# Patient Record
Sex: Male | Born: 1990 | Race: White | Hispanic: No | Marital: Single | Smoking: Former smoker
Health system: Southern US, Community
[De-identification: ages and names within clinical notes are randomized; demographics above are authoritative.]

## PROBLEM LIST (undated history)

## (undated) DIAGNOSIS — R0602 Shortness of breath: Secondary | ICD-10-CM

## (undated) DIAGNOSIS — I319 Disease of pericardium, unspecified: Secondary | ICD-10-CM

## (undated) DIAGNOSIS — C801 Malignant (primary) neoplasm, unspecified: Secondary | ICD-10-CM

## (undated) DIAGNOSIS — F988 Other specified behavioral and emotional disorders with onset usually occurring in childhood and adolescence: Secondary | ICD-10-CM

## (undated) DIAGNOSIS — J45909 Unspecified asthma, uncomplicated: Secondary | ICD-10-CM

## (undated) HISTORY — PX: WISDOM TOOTH EXTRACTION: SHX21

## (undated) HISTORY — PX: APPENDECTOMY: SHX54

---
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
## 2016-04-19 ENCOUNTER — Ambulatory Visit (INDEPENDENT_AMBULATORY_CARE_PROVIDER_SITE_OTHER): Payer: BLUE CROSS/BLUE SHIELD

## 2016-04-19 ENCOUNTER — Ambulatory Visit (HOSPITAL_COMMUNITY): Payer: BLUE CROSS/BLUE SHIELD

## 2016-04-19 ENCOUNTER — Encounter (HOSPITAL_COMMUNITY): Payer: Self-pay | Admitting: *Deleted

## 2016-04-19 ENCOUNTER — Ambulatory Visit (HOSPITAL_COMMUNITY)
Admission: EM | Admit: 2016-04-19 | Discharge: 2016-04-19 | Disposition: A | Discharge status: Home / Self Care | Payer: BLUE CROSS/BLUE SHIELD | Attending: Family Medicine | Admitting: Family Medicine

## 2016-04-19 DIAGNOSIS — M778 Other enthesopathies, not elsewhere classified: Secondary | ICD-10-CM

## 2016-04-19 DIAGNOSIS — M25531 Pain in right wrist: Secondary | ICD-10-CM | POA: Diagnosis not present

## 2016-04-19 MED ORDER — DICLOFENAC POTASSIUM 50 MG PO TABS: 50.0000 mg | ORAL_TABLET | Freq: Three times a day (TID) | ORAL | 0 refills | Status: DC

## 2016-04-19 NOTE — ED Provider Notes (Signed)
[place]    CSN: [id number] Arrival date & time: 04/19/16  1645     History   Chief Complaint Chief Complaint  Patient presents with  . Wrist Pain    HPI Martin Spencer is a 25 y.o. male.   The history is provided by the patient.  Wrist Pain  This is a new problem. The current episode started 2 days ago. The problem has been gradually worsening. Pertinent negatives include no chest pain, no abdominal pain, no headaches and no shortness of breath. Associated symptoms comments: repetetive use  Injury at home and work, now with pain.. The symptoms are aggravated by bending.    History reviewed. No pertinent past medical history.  There are no active problems to display for this patient.   History reviewed. No pertinent surgical history.     Home Medications    Prior to Admission medications   Not on File    Family History History reviewed. No pertinent family history.  Social History Social History  Substance Use Topics  . Smoking status: Never Smoker  . Smokeless tobacco: Never Used  . Alcohol use Yes     Allergies   Patient has no allergy information on record.   Review of Systems Review of Systems  Constitutional: Negative.   Respiratory: Negative for shortness of breath.   Cardiovascular: Negative for chest pain.  Gastrointestinal: Negative for abdominal pain.  Genitourinary: Negative.   Musculoskeletal: Positive for myalgias.  Skin: Negative.   Neurological: Negative.  Negative for headaches.  All other systems reviewed and are negative.    Physical Exam Triage Vital Signs ED Triage Vitals  Enc Vitals Group     BP 04/19/16 1706 134/72     Pulse Rate 04/19/16 1706 78     Resp 04/19/16 1706 18     Temp 04/19/16 1706 98.6 F (37 C)     Temp Source 04/19/16 1706 Oral     SpO2 04/19/16 1706 100 %     Weight --      Height --      Head Circumference --      Peak Flow --      Pain Score 04/19/16 1707 4     Pain Loc --       Pain Edu? --      Excl. in [place]? --    No data found.   Updated Vital Signs BP 134/72 (BP Location: Right Arm)   Pulse 78   Temp 98.6 F (37 C) (Oral)   Resp 18   SpO2 100%   Visual Acuity Right Eye Distance:   Left Eye Distance:   Bilateral Distance:    Right Eye Near:   Left Eye Near:    Bilateral Near:     Physical Exam  Constitutional: He appears well-nourished. He appears distressed.  Musculoskeletal: He exhibits tenderness.  Neurological: He is alert.  Skin: Skin is warm and dry.  Nursing note and vitals reviewed.    UC Treatments / Results  Labs (all labs ordered are listed, but only abnormal results are displayed) Labs Reviewed - No data to display  EKG  EKG Interpretation None       Radiology No results found.  Procedures Procedures (including critical care time)  Medications Ordered in UC Medications - No data to display   Initial Impression / Assessment and Plan / UC Course  I have reviewed the triage vital signs and the nursing notes.  Pertinent labs & imaging results  that were available during my care of the patient were reviewed by me and considered in my medical decision making (see chart for details).  Clinical Course       Final Clinical Impressions(s) / UC Diagnoses   Final diagnoses:  None    New Prescriptions New Prescriptions   No medications on file     Billy Fischer, MD 05/02/16 [id number]

## 2016-04-19 NOTE — ED Notes (Signed)
r  Universal   splint

## 2016-04-19 NOTE — Discharge Instructions (Signed)
Splint, ice , medicine and see specialist if further problems

## 2016-04-19 NOTE — ED Triage Notes (Signed)
Pt  Reports  Pain  r   Wrist  X   Several  Days  Ago   He  Denys  Any  specefic       Injury       He  Reports         That   He   Has  Been      Working   Out a  Lot  Recently      He   Is  Awake  And  Alert  And  Oriented

## 2016-05-08 DIAGNOSIS — Z114 Encounter for screening for human immunodeficiency virus [HIV]: Secondary | ICD-10-CM | POA: Diagnosis not present

## 2016-05-08 DIAGNOSIS — Z7251 High risk heterosexual behavior: Secondary | ICD-10-CM | POA: Diagnosis not present

## 2016-10-29 DIAGNOSIS — Z79899 Other long term (current) drug therapy: Secondary | ICD-10-CM | POA: Diagnosis not present

## 2016-10-30 DIAGNOSIS — F900 Attention-deficit hyperactivity disorder, predominantly inattentive type: Secondary | ICD-10-CM | POA: Diagnosis not present

## 2016-10-30 DIAGNOSIS — F411 Generalized anxiety disorder: Secondary | ICD-10-CM | POA: Diagnosis not present

## 2016-11-27 DIAGNOSIS — F900 Attention-deficit hyperactivity disorder, predominantly inattentive type: Secondary | ICD-10-CM | POA: Diagnosis not present

## 2016-12-25 DIAGNOSIS — F900 Attention-deficit hyperactivity disorder, predominantly inattentive type: Secondary | ICD-10-CM | POA: Diagnosis not present

## 2017-03-26 DIAGNOSIS — F411 Generalized anxiety disorder: Secondary | ICD-10-CM | POA: Diagnosis not present

## 2017-03-26 DIAGNOSIS — F900 Attention-deficit hyperactivity disorder, predominantly inattentive type: Secondary | ICD-10-CM | POA: Diagnosis not present

## 2017-06-24 DIAGNOSIS — F900 Attention-deficit hyperactivity disorder, predominantly inattentive type: Secondary | ICD-10-CM | POA: Diagnosis not present

## 2017-10-13 DIAGNOSIS — F900 Attention-deficit hyperactivity disorder, predominantly inattentive type: Secondary | ICD-10-CM | POA: Diagnosis not present

## 2017-10-13 DIAGNOSIS — F411 Generalized anxiety disorder: Secondary | ICD-10-CM | POA: Diagnosis not present

## 2017-10-26 DIAGNOSIS — S81811A Laceration without foreign body, right lower leg, initial encounter: Secondary | ICD-10-CM | POA: Diagnosis not present

## 2017-11-14 ENCOUNTER — Emergency Department (HOSPITAL_COMMUNITY): Payer: BLUE CROSS/BLUE SHIELD | Admitting: Certified Registered Nurse Anesthetist

## 2017-11-14 ENCOUNTER — Encounter (HOSPITAL_COMMUNITY): Payer: Self-pay | Admitting: Emergency Medicine

## 2017-11-14 ENCOUNTER — Encounter (HOSPITAL_COMMUNITY)
Admission: EM | Disposition: A | Discharge status: Home / Self Care | Payer: Self-pay | Source: Home / Self Care | Attending: Emergency Medicine

## 2017-11-14 ENCOUNTER — Other Ambulatory Visit: Payer: Self-pay

## 2017-11-14 ENCOUNTER — Emergency Department (HOSPITAL_COMMUNITY): Payer: BLUE CROSS/BLUE SHIELD

## 2017-11-14 ENCOUNTER — Observation Stay (HOSPITAL_COMMUNITY)
Admission: EM | Admit: 2017-11-14 | Discharge: 2017-11-15 | Disposition: A | Discharge status: Home / Self Care | Payer: BLUE CROSS/BLUE SHIELD | Attending: General Surgery | Admitting: General Surgery

## 2017-11-14 DIAGNOSIS — Z79899 Other long term (current) drug therapy: Secondary | ICD-10-CM | POA: Diagnosis not present

## 2017-11-14 DIAGNOSIS — K37 Unspecified appendicitis: Secondary | ICD-10-CM | POA: Diagnosis not present

## 2017-11-14 DIAGNOSIS — R112 Nausea with vomiting, unspecified: Secondary | ICD-10-CM | POA: Diagnosis not present

## 2017-11-14 DIAGNOSIS — K3580 Unspecified acute appendicitis: Principal | ICD-10-CM | POA: Diagnosis present

## 2017-11-14 DIAGNOSIS — R109 Unspecified abdominal pain: Secondary | ICD-10-CM | POA: Diagnosis not present

## 2017-11-14 DIAGNOSIS — R1110 Vomiting, unspecified: Secondary | ICD-10-CM | POA: Diagnosis not present

## 2017-11-14 HISTORY — PX: LAPAROSCOPIC APPENDECTOMY: SHX408

## 2017-11-14 LAB — URINALYSIS, ROUTINE W REFLEX MICROSCOPIC
Bilirubin Urine: NEGATIVE
GLUCOSE, UA: NEGATIVE mg/dL
HGB URINE DIPSTICK: NEGATIVE
Ketones, ur: NEGATIVE mg/dL
Leukocytes, UA: NEGATIVE
Nitrite: NEGATIVE
PH: 7 (ref 5.0–8.0)
Protein, ur: NEGATIVE mg/dL
SPECIFIC GRAVITY, URINE: 1.02 (ref 1.005–1.030)

## 2017-11-14 LAB — COMPREHENSIVE METABOLIC PANEL
ALBUMIN: 4.4 g/dL (ref 3.5–5.0)
ALT: 59 U/L — ABNORMAL HIGH (ref 0–44)
ANION GAP: 7 (ref 5–15)
AST: 41 U/L (ref 15–41)
Alkaline Phosphatase: 48 U/L (ref 38–126)
BUN: 11 mg/dL (ref 6–20)
CHLORIDE: 104 mmol/L (ref 98–111)
CO2: 28 mmol/L (ref 22–32)
Calcium: 10 mg/dL (ref 8.9–10.3)
Creatinine, Ser: 0.9 mg/dL (ref 0.61–1.24)
GFR calc non Af Amer: >60 mL/min (ref >60)
GLUCOSE: 113 mg/dL — AB (ref 70–99)
Potassium: 4.1 mmol/L (ref 3.5–5.1)
SODIUM: 139 mmol/L (ref 135–145)
Total Bilirubin: 2.5 mg/dL — ABNORMAL HIGH (ref 0.3–1.2)
Total Protein: 7.5 g/dL (ref 6.5–8.1)

## 2017-11-14 LAB — CBC
HEMATOCRIT: 46.7 % (ref 39.0–52.0)
HEMOGLOBIN: 16.3 g/dL (ref 13.0–17.0)
MCH: 29.9 pg (ref 26.0–34.0)
MCHC: 34.9 g/dL (ref 30.0–36.0)
MCV: 85.5 fL (ref 78.0–100.0)
Platelets: 200 10*3/uL (ref 150–400)
RBC: 5.46 MIL/uL (ref 4.22–5.81)
RDW: 12.1 % (ref 11.5–15.5)
WBC: 16.2 10*3/uL — ABNORMAL HIGH (ref 4.0–10.5)

## 2017-11-14 LAB — LIPASE, BLOOD: LIPASE: 31 U/L (ref 11–51)

## 2017-11-14 SURGERY — APPENDECTOMY, LAPAROSCOPIC
Anesthesia: General | Site: Abdomen

## 2017-11-14 MED ORDER — PHENYLEPHRINE 40 MCG/ML (10ML) SYRINGE FOR IV PUSH (FOR BLOOD PRESSURE SUPPORT)
PREFILLED_SYRINGE | INTRAVENOUS | Status: DC | PRN
  Administered 2017-11-14: 80 ug via INTRAVENOUS

## 2017-11-14 MED ORDER — ONDANSETRON HCL 4 MG/2ML IJ SOLN
4.0000 mg | Freq: Once | INTRAMUSCULAR | Status: AC
  Administered 2017-11-14: 4 mg via INTRAVENOUS
  Filled 2017-11-14 (×2): qty 2

## 2017-11-14 MED ORDER — SUGAMMADEX SODIUM 200 MG/2ML IV SOLN
INTRAVENOUS | Status: DC | PRN
  Administered 2017-11-14: 200 mg via INTRAVENOUS

## 2017-11-14 MED ORDER — ENOXAPARIN SODIUM 40 MG/0.4ML ~~LOC~~ SOLN
40.0000 mg | SUBCUTANEOUS | Status: DC
  Administered 2017-11-15: 40 mg via SUBCUTANEOUS
  Filled 2017-11-14: qty 0.4

## 2017-11-14 MED ORDER — HYDROMORPHONE HCL 1 MG/ML IJ SOLN
0.2500 mg | INTRAMUSCULAR | Status: DC | PRN
  Administered 2017-11-14 (×2): 0.5 mg via INTRAVENOUS

## 2017-11-14 MED ORDER — 0.9 % SODIUM CHLORIDE (POUR BTL) OPTIME
TOPICAL | Status: DC | PRN
  Administered 2017-11-14: 1000 mL

## 2017-11-14 MED ORDER — BUPIVACAINE-EPINEPHRINE 0.25% -1:200000 IJ SOLN
INTRAMUSCULAR | Status: DC | PRN
  Administered 2017-11-14: 20 mL

## 2017-11-14 MED ORDER — ROCURONIUM BROMIDE 10 MG/ML (PF) SYRINGE
PREFILLED_SYRINGE | INTRAVENOUS | Status: AC
  Filled 2017-11-14: qty 10

## 2017-11-14 MED ORDER — ENSURE SURGERY PO LIQD
237.0000 mL | Freq: Two times a day (BID) | ORAL | Status: DC
  Filled 2017-11-14 (×3): qty 237

## 2017-11-14 MED ORDER — PHENYLEPHRINE 40 MCG/ML (10ML) SYRINGE FOR IV PUSH (FOR BLOOD PRESSURE SUPPORT)
PREFILLED_SYRINGE | INTRAVENOUS | Status: AC
  Filled 2017-11-14: qty 10

## 2017-11-14 MED ORDER — ONDANSETRON HCL 4 MG/2ML IJ SOLN
4.0000 mg | Freq: Four times a day (QID) | INTRAMUSCULAR | Status: DC | PRN
  Administered 2017-11-14: 4 mg via INTRAVENOUS
  Filled 2017-11-14: qty 2

## 2017-11-14 MED ORDER — ONDANSETRON HCL 4 MG/2ML IJ SOLN
INTRAMUSCULAR | Status: DC | PRN
  Administered 2017-11-14: 30 mg via INTRAVENOUS

## 2017-11-14 MED ORDER — DEXAMETHASONE SODIUM PHOSPHATE 10 MG/ML IJ SOLN
INTRAMUSCULAR | Status: DC | PRN
  Administered 2017-11-14: 10 mg via INTRAVENOUS

## 2017-11-14 MED ORDER — ONDANSETRON HCL 4 MG PO TABS: 4.0000 mg | ORAL_TABLET | Freq: Four times a day (QID) | ORAL | Status: DC | PRN

## 2017-11-14 MED ORDER — DIPHENHYDRAMINE HCL 25 MG PO CAPS: 25.0000 mg | ORAL_CAPSULE | Freq: Four times a day (QID) | ORAL | Status: DC | PRN

## 2017-11-14 MED ORDER — ACETAMINOPHEN 500 MG PO TABS
1000.0000 mg | ORAL_TABLET | Freq: Four times a day (QID) | ORAL | Status: AC
  Administered 2017-11-14 – 2017-11-15 (×4): 1000 mg via ORAL
  Filled 2017-11-14 (×4): qty 2

## 2017-11-14 MED ORDER — OXYCODONE HCL 5 MG/5ML PO SOLN: 5.0000 mg | Freq: Once | ORAL | Status: DC | PRN

## 2017-11-14 MED ORDER — MIDAZOLAM HCL 2 MG/2ML IJ SOLN
INTRAMUSCULAR | Status: DC | PRN
  Administered 2017-11-14: 2 mg via INTRAVENOUS

## 2017-11-14 MED ORDER — SODIUM CHLORIDE 0.9 % IR SOLN
Status: DC | PRN
  Administered 2017-11-14: 1000 mL

## 2017-11-14 MED ORDER — IOHEXOL 300 MG/ML  SOLN
100.0000 mL | Freq: Once | INTRAMUSCULAR | Status: AC | PRN
  Administered 2017-11-14: 100 mL via INTRAVENOUS

## 2017-11-14 MED ORDER — ONDANSETRON HCL 4 MG/2ML IJ SOLN
INTRAMUSCULAR | Status: AC
  Administered 2017-11-14: 4 mg via INTRAVENOUS
  Filled 2017-11-14: qty 2

## 2017-11-14 MED ORDER — MIDAZOLAM HCL 2 MG/2ML IJ SOLN
INTRAMUSCULAR | Status: AC
  Filled 2017-11-14: qty 2

## 2017-11-14 MED ORDER — DEXAMETHASONE SODIUM PHOSPHATE 10 MG/ML IJ SOLN
INTRAMUSCULAR | Status: AC
  Filled 2017-11-14: qty 1

## 2017-11-14 MED ORDER — SUCCINYLCHOLINE CHLORIDE 200 MG/10ML IV SOSY
PREFILLED_SYRINGE | INTRAVENOUS | Status: AC
  Filled 2017-11-14: qty 10

## 2017-11-14 MED ORDER — OXYCODONE HCL 5 MG PO TABS
5.0000 mg | ORAL_TABLET | ORAL | Status: DC | PRN
  Administered 2017-11-14 – 2017-11-15 (×5): 10 mg via ORAL
  Filled 2017-11-14 (×5): qty 2

## 2017-11-14 MED ORDER — FENTANYL CITRATE (PF) 250 MCG/5ML IJ SOLN
INTRAMUSCULAR | Status: DC | PRN
  Administered 2017-11-14 (×3): 50 ug via INTRAVENOUS

## 2017-11-14 MED ORDER — ZOLPIDEM TARTRATE 5 MG PO TABS: 5.0000 mg | ORAL_TABLET | Freq: Every evening | ORAL | Status: DC | PRN

## 2017-11-14 MED ORDER — ALUM & MAG HYDROXIDE-SIMETH 200-200-20 MG/5ML PO SUSP: 30.0000 mL | Freq: Four times a day (QID) | ORAL | Status: DC | PRN

## 2017-11-14 MED ORDER — MORPHINE SULFATE (PF) 4 MG/ML IV SOLN
8.0000 mg | Freq: Once | INTRAVENOUS | Status: AC
  Administered 2017-11-14: 8 mg via INTRAVENOUS
  Filled 2017-11-14: qty 2

## 2017-11-14 MED ORDER — METRONIDAZOLE IN NACL 5-0.79 MG/ML-% IV SOLN
500.0000 mg | Freq: Once | INTRAVENOUS | Status: AC
  Administered 2017-11-14: 500 mg via INTRAVENOUS
  Filled 2017-11-14: qty 100

## 2017-11-14 MED ORDER — MORPHINE SULFATE (PF) 4 MG/ML IV SOLN
4.0000 mg | INTRAVENOUS | Status: DC | PRN
  Administered 2017-11-14 – 2017-11-15 (×3): 4 mg via INTRAVENOUS
  Filled 2017-11-14 (×4): qty 1

## 2017-11-14 MED ORDER — ONDANSETRON HCL 4 MG/2ML IJ SOLN
INTRAMUSCULAR | Status: AC
  Filled 2017-11-14: qty 2

## 2017-11-14 MED ORDER — PROPOFOL 10 MG/ML IV BOLUS
INTRAVENOUS | Status: AC
  Filled 2017-11-14: qty 20

## 2017-11-14 MED ORDER — CELECOXIB 200 MG PO CAPS
200.0000 mg | ORAL_CAPSULE | Freq: Two times a day (BID) | ORAL | Status: DC
  Administered 2017-11-14 – 2017-11-15 (×3): 200 mg via ORAL
  Filled 2017-11-14 (×3): qty 1

## 2017-11-14 MED ORDER — ONDANSETRON 4 MG PO TBDP
4.0000 mg | ORAL_TABLET | Freq: Once | ORAL | Status: AC | PRN
  Administered 2017-11-14: 4 mg via ORAL
  Filled 2017-11-14: qty 1

## 2017-11-14 MED ORDER — HYDROMORPHONE HCL 1 MG/ML IJ SOLN
INTRAMUSCULAR | Status: AC
  Administered 2017-11-14: 0.5 mg via INTRAVENOUS
  Filled 2017-11-14: qty 1

## 2017-11-14 MED ORDER — OXYCODONE-ACETAMINOPHEN 5-325 MG PO TABS
1.0000 | ORAL_TABLET | ORAL | Status: DC | PRN
  Administered 2017-11-14: 1 via ORAL
  Filled 2017-11-14: qty 1

## 2017-11-14 MED ORDER — GABAPENTIN 300 MG PO CAPS
300.0000 mg | ORAL_CAPSULE | Freq: Two times a day (BID) | ORAL | Status: DC
  Administered 2017-11-14 – 2017-11-15 (×2): 300 mg via ORAL
  Filled 2017-11-14 (×2): qty 1

## 2017-11-14 MED ORDER — SODIUM CHLORIDE 0.9 % IV SOLN
2.0000 g | Freq: Once | INTRAVENOUS | Status: AC
  Administered 2017-11-14: 2 g via INTRAVENOUS
  Filled 2017-11-14: qty 20

## 2017-11-14 MED ORDER — KCL IN DEXTROSE-NACL 20-5-0.45 MEQ/L-%-% IV SOLN
INTRAVENOUS | Status: DC
  Administered 2017-11-14: 17:00:00 via INTRAVENOUS
  Filled 2017-11-14: qty 1000

## 2017-11-14 MED ORDER — DIPHENHYDRAMINE HCL 50 MG/ML IJ SOLN: 25.0000 mg | Freq: Four times a day (QID) | INTRAMUSCULAR | Status: DC | PRN

## 2017-11-14 MED ORDER — HYDRALAZINE HCL 20 MG/ML IJ SOLN: 10.0000 mg | INTRAMUSCULAR | Status: DC | PRN

## 2017-11-14 MED ORDER — SODIUM CHLORIDE 0.9 % IV BOLUS
1000.0000 mL | Freq: Once | INTRAVENOUS | Status: AC
  Administered 2017-11-14: 1000 mL via INTRAVENOUS

## 2017-11-14 MED ORDER — OXYCODONE HCL 5 MG PO TABS: 5.0000 mg | ORAL_TABLET | Freq: Once | ORAL | Status: DC | PRN

## 2017-11-14 MED ORDER — ONDANSETRON HCL 4 MG/2ML IJ SOLN
4.0000 mg | Freq: Four times a day (QID) | INTRAMUSCULAR | Status: AC | PRN
  Administered 2017-11-14: 4 mg via INTRAVENOUS

## 2017-11-14 MED ORDER — KETOROLAC TROMETHAMINE 30 MG/ML IJ SOLN
INTRAMUSCULAR | Status: DC | PRN
  Administered 2017-11-14: 30 mg via INTRAVENOUS

## 2017-11-14 MED ORDER — FENTANYL CITRATE (PF) 250 MCG/5ML IJ SOLN
INTRAMUSCULAR | Status: AC
Start: 2017-11-14 — End: 2017-11-14
  Filled 2017-11-14: qty 5

## 2017-11-14 MED ORDER — LIDOCAINE 2% (20 MG/ML) 5 ML SYRINGE
INTRAMUSCULAR | Status: AC
  Filled 2017-11-14: qty 5

## 2017-11-14 MED ORDER — BUPIVACAINE-EPINEPHRINE (PF) 0.25% -1:200000 IJ SOLN
INTRAMUSCULAR | Status: AC
  Filled 2017-11-14: qty 30

## 2017-11-14 MED ORDER — ROCURONIUM BROMIDE 10 MG/ML (PF) SYRINGE
PREFILLED_SYRINGE | INTRAVENOUS | Status: DC | PRN
  Administered 2017-11-14: 30 mg via INTRAVENOUS
  Administered 2017-11-14: 10 mg via INTRAVENOUS

## 2017-11-14 MED ORDER — LACTATED RINGERS IV SOLN
INTRAVENOUS | Status: DC | PRN
  Administered 2017-11-14: 14:00:00 via INTRAVENOUS

## 2017-11-14 SURGICAL SUPPLY — 46 items
ADH SKN CLS APL DERMABOND .7 (GAUZE/BANDAGES/DRESSINGS) ×1
APPLIER CLIP ROT 10 11.4 M/L (STAPLE)
APR CLP MED LRG 11.4X10 (STAPLE)
BAG SPEC RTRVL 10 TROC 200 (ENDOMECHANICALS) ×1
BLADE CLIPPER SURG (BLADE) ×1 IMPLANT
CANISTER SUCT 3000ML PPV (MISCELLANEOUS) ×2 IMPLANT
CHLORAPREP W/TINT 26ML (MISCELLANEOUS) ×2 IMPLANT
CLIP APPLIE ROT 10 11.4 M/L (STAPLE) IMPLANT
COVER SURGICAL LIGHT HANDLE (MISCELLANEOUS) ×2 IMPLANT
CUTTER FLEX LINEAR 45M (STAPLE) ×2 IMPLANT
DERMABOND ADVANCED (GAUZE/BANDAGES/DRESSINGS) ×1
DERMABOND ADVANCED .7 DNX12 (GAUZE/BANDAGES/DRESSINGS) ×1 IMPLANT
ELECT REM PT RETURN 9FT ADLT (ELECTROSURGICAL) ×2
ELECTRODE REM PT RTRN 9FT ADLT (ELECTROSURGICAL) ×1 IMPLANT
GLOVE BIO SURGEON STRL SZ8 (GLOVE) ×2 IMPLANT
GLOVE BIOGEL PI IND STRL 8 (GLOVE) ×1 IMPLANT
GLOVE BIOGEL PI INDICATOR 8 (GLOVE) ×1
GOWN STRL REUS W/ TWL LRG LVL3 (GOWN DISPOSABLE) ×2 IMPLANT
GOWN STRL REUS W/ TWL XL LVL3 (GOWN DISPOSABLE) ×1 IMPLANT
GOWN STRL REUS W/TWL LRG LVL3 (GOWN DISPOSABLE) ×4
GOWN STRL REUS W/TWL XL LVL3 (GOWN DISPOSABLE) ×2
KIT BASIN OR (CUSTOM PROCEDURE TRAY) ×2 IMPLANT
KIT TURNOVER KIT B (KITS) ×2 IMPLANT
NEEDLE 22X1 1/2 (OR ONLY) (NEEDLE) ×2 IMPLANT
NS IRRIG 1000ML POUR BTL (IV SOLUTION) ×2 IMPLANT
PAD ARMBOARD 7.5X6 YLW CONV (MISCELLANEOUS) ×4 IMPLANT
POUCH RETRIEVAL ECOSAC 10 (ENDOMECHANICALS) ×1 IMPLANT
POUCH RETRIEVAL ECOSAC 10MM (ENDOMECHANICALS) ×1
RELOAD 45 VASCULAR/THIN (ENDOMECHANICALS) IMPLANT
RELOAD STAPLE 45 2.5 WHT GRN (ENDOMECHANICALS) IMPLANT
RELOAD STAPLE 45 3.5 BLU ETS (ENDOMECHANICALS) IMPLANT
RELOAD STAPLE TA45 3.5 REG BLU (ENDOMECHANICALS) ×2 IMPLANT
SCISSORS LAP 5X35 DISP (ENDOMECHANICALS) ×1 IMPLANT
SET IRRIG TUBING LAPAROSCOPIC (IRRIGATION / IRRIGATOR) ×2 IMPLANT
SHEARS HARMONIC ACE PLUS 36CM (ENDOMECHANICALS) ×2 IMPLANT
SPECIMEN JAR SMALL (MISCELLANEOUS) ×2 IMPLANT
SUT VIC AB 4-0 PS2 27 (SUTURE) ×2 IMPLANT
TOWEL OR 17X24 6PK STRL BLUE (TOWEL DISPOSABLE) ×1 IMPLANT
TOWEL OR 17X26 10 PK STRL BLUE (TOWEL DISPOSABLE) ×2 IMPLANT
TRAY FOLEY CATH SILVER 16FR (SET/KITS/TRAYS/PACK) ×1 IMPLANT
TRAY LAPAROSCOPIC MC (CUSTOM PROCEDURE TRAY) ×2 IMPLANT
TROCAR XCEL 12X100 BLDLESS (ENDOMECHANICALS) ×2 IMPLANT
TROCAR XCEL BLUNT TIP 100MML (ENDOMECHANICALS) ×2 IMPLANT
TROCAR XCEL NON-BLD 5MMX100MML (ENDOMECHANICALS) ×2 IMPLANT
TUBING INSUFFLATION (TUBING) ×2 IMPLANT
WATER STERILE IRR 1000ML POUR (IV SOLUTION) ×2 IMPLANT

## 2017-11-14 NOTE — Progress Notes (Signed)
Patient arrived to 6 n07 A&Ox4, VSS, LFA IV intact and infusing.  Noted to have 3 port sites with skin glue closure.  Denies pain at this time.  Family at bedside.  Patient and family oriented to room and equipment.  Will continue to monitor.

## 2017-11-14 NOTE — Anesthesia Procedure Notes (Signed)
Procedure Name: Intubation Date/Time: 11/14/2017 2:00 PM Performed by: Clearnce Sorrel, CRNA Pre-anesthesia Checklist: Patient identified, Emergency Drugs available, Suction available, Patient being monitored and Timeout performed Patient Re-evaluated:Patient Re-evaluated prior to induction Oxygen Delivery Method: Circle system utilized Preoxygenation: Pre-oxygenation with 100% oxygen Induction Type: IV induction Laryngoscope Size: Mac and 4 Grade View: Grade I Tube type: Oral Tube size: 7.5 mm Number of attempts: 1 Airway Equipment and Method: Stylet Placement Confirmation: ETT inserted through vocal cords under direct vision,  positive ETCO2 and breath sounds checked- equal and bilateral Secured at: 23 cm Tube secured with: Tape Dental Injury: Teeth and Oropharynx as per pre-operative assessment

## 2017-11-14 NOTE — Op Note (Signed)
11/14/2017  2:48 PM  PATIENT:  Martin Spencer  27 y.o. male  PRE-OPERATIVE DIAGNOSIS:  appendicitis  POST-OPERATIVE DIAGNOSIS:  appendicitis  PROCEDURE:  Procedure(s): APPENDECTOMY LAPAROSCOPIC  SURGEON:  Surgeon(s): Georganna Skeans, MD  ASSISTANTS: none   ANESTHESIA:   local and general  EBL:  Total I/O In: 1100 [IV Piggyback:1100] Out: -   BLOOD ADMINISTERED:none  DRAINS: none   SPECIMEN:  Excision  DISPOSITION OF SPECIMEN:  PATHOLOGY  COUNTS:  YES  DICTATION: .Dragon Dictation Findings: Acute appendicitis without perforation  Procedure in detail: Payne presents for appendectomy.  He received intravenous antibiotics.  Informed consent was obtained.  He was brought to the operating room and general endotracheal anesthesia was administered by the anesthesia staff.  His abdomen was prepped and draped in sterile fashion.  A timeout procedure was performed.The infraumbilical region was infiltrated with local. Infraumbilical incision was made. Subcutaneous tissues were dissected down revealing the anterior fascia. This was divided sharply along the midline. Peritoneal cavity was entered under direct vision without complication. A 0 Vicryl pursestring was placed around the fascial opening. Hassan trocar was inserted into the abdomen. The abdomen was insufflated with carbon dioxide in standard fashion. Under direct vision a 12 mm left lower quadrant and a 5 mm right mid abdomen port were placed.  Local was used at each port site.  Laparoscopic exploration revealed a very inflamed but not perforated appendix.  The mesoappendix was divided with harmonic scalpel achieving excellent hemostasis.  The base of the appendix was divided with Endo GIA with vascular load.  The appendix was placed in the bag and removed from the abdomen.  It was sent to pathology.  The staple line on the cecum was intact and there was no bleeding.  The abdomen was copiously irrigated and the irrigation fluid  was evacuated.  Ports were removed under direct vision.  Pneumoperitoneum was released.  All 3 wounds were irrigated and the skin which was closed with 4-0 Vicryl followed by Dermabond.  All counts were correct.  He tolerated the procedure well without apparent complication was taken recovery in stable condition.  PATIENT DISPOSITION:  PACU - hemodynamically stable.   Delay start of Pharmacological VTE agent (>24hrs) due to surgical blood loss or risk of bleeding:  no  Georganna Skeans, MD, MPH, FACS Pager: [phone]  7/21/20192:48 PM

## 2017-11-14 NOTE — ED Notes (Signed)
Report called to [facility] in [place]. Will take pt to [facility]

## 2017-11-14 NOTE — ED Triage Notes (Signed)
Reports having RLQ abdominal pain that started after eating ravioli at 9pm last night.  Reports having n/v early in the night.

## 2017-11-14 NOTE — H&P (Signed)
Martin Spencer is an 27 y.o. male.   Chief Complaint: Right lower quadrant abdominal pain HPI: Martin Spencer presents complaining of right lower quadrant abdominal pain since yesterday.  It is associated with nausea.  It is persistently gotten worse.  He was evaluated in the emergency department and was found to have a leukocytosis of 16,200 and CT scan of the abdomen and pelvis consistent with acute appendicitis.  I was asked to see him for surgical management.  History reviewed. No pertinent past medical history.  History reviewed. No pertinent surgical history.  No family history on file. Social History:  reports that he has never smoked. He has never used smokeless tobacco. He reports that he drinks alcohol. His drug history is not on file.  Allergies: No Known Allergies   (Not in a hospital admission)  Results for orders placed or performed during the hospital encounter of 11/14/17 (from the past 48 hour(s))  Lipase, blood     Status: None   Collection Time: 11/14/17  6:20 AM  Result Value Ref Range   Lipase 31 11 - 51 U/L    Comment: Performed at [facility], [address] [place], [place] [address]  Comprehensive metabolic panel     Status: Abnormal   Collection Time: 11/14/17  6:20 AM  Result Value Ref Range   Sodium 139 135 - 145 mmol/L   Potassium 4.1 3.5 - 5.1 mmol/L   Chloride 104 98 - 111 mmol/L    Comment: Please note change in reference range.   CO2 28 22 - 32 mmol/L   Glucose, Bld 113 (H) 70 - 99 mg/dL    Comment: Please note change in reference range.   BUN 11 6 - 20 mg/dL    Comment: Please note change in reference range.   Creatinine, Ser 0.90 0.61 - 1.24 mg/dL   Calcium 10.0 8.9 - 10.3 mg/dL   Total Protein 7.5 6.5 - 8.1 g/dL   Albumin 4.4 3.5 - 5.0 g/dL   AST 41 15 - 41 U/L   ALT 59 (H) 0 - 44 U/L    Comment: Please note change in reference range.   Alkaline Phosphatase 48 38 - 126 U/L   Total Bilirubin 2.5 (H) 0.3 - 1.2 mg/dL   GFR calc non Af  Amer >60 >60 mL/min   GFR calc Af Amer >60 >60 mL/min    Comment: (NOTE) The eGFR has been calculated using the CKD EPI equation. This calculation has not been validated in all clinical situations. eGFR's persistently <60 mL/min signify possible Chronic Kidney Disease.    Anion gap 7 5 - 15    Comment: Performed at [facility] [address] [place], [place] [address]  CBC     Status: Abnormal   Collection Time: 11/14/17  6:20 AM  Result Value Ref Range   WBC 16.2 (H) 4.0 - 10.5 K/uL   RBC 5.46 4.22 - 5.81 MIL/uL   Hemoglobin 16.3 13.0 - 17.0 g/dL   HCT 46.7 39.0 - 52.0 %   MCV 85.5 78.0 - 100.0 fL   MCH 29.9 26.0 - 34.0 pg   MCHC 34.9 30.0 - 36.0 g/dL   RDW 12.1 11.5 - 15.5 %   Platelets 200 150 - 400 K/uL    Comment: Performed at [facility], [place] [address], [place], [place] [address]  Urinalysis, Routine w reflex microscopic     Status: Abnormal   Collection Time: 11/14/17  6:20 AM  Result Value Ref Range   Color, Urine YELLOW YELLOW   APPearance CLOUDY (A) CLEAR   Specific Gravity, Urine 1.020 1.005 - 1.030   pH 7.0 5.0 - 8.0   Glucose, UA NEGATIVE NEGATIVE mg/dL   Hgb urine dipstick NEGATIVE NEGATIVE   Bilirubin Urine NEGATIVE NEGATIVE   Ketones, ur NEGATIVE NEGATIVE mg/dL   Protein, ur NEGATIVE NEGATIVE mg/dL   Nitrite NEGATIVE NEGATIVE   Leukocytes, UA NEGATIVE NEGATIVE    Comment: Performed at [facility] [address] [place], [place] [address]   Ct Abdomen Pelvis W Contrast  Result Date: 11/14/2017 CLINICAL DATA:  Right lower quadrant abdominal pain since 9 p.m. last night. Some associated nausea and vomiting last night. EXAM: CT ABDOMEN AND PELVIS WITH CONTRAST TECHNIQUE: Multidetector CT imaging of the abdomen and pelvis was performed using the standard protocol following bolus administration of intravenous contrast. CONTRAST:  181m OMNIPAQUE IOHEXOL 300 MG/ML  SOLN COMPARISON:  None. FINDINGS: Lower chest: Clear lung bases.  Hepatobiliary: No focal liver abnormality is seen. No gallstones, gallbladder wall thickening, or biliary dilatation. Pancreas: Unremarkable. No pancreatic ductal dilatation or surrounding inflammatory changes. Spleen: Normal in size without focal abnormality. Adrenals/Urinary Tract: Adrenal glands are unremarkable. Kidneys are normal, without renal calculi, focal lesion, or hydronephrosis. Bladder is unremarkable. Stomach/Bowel: Changes of appendicitis as follows: Appendix: Location: Retrocecal, originating from the cecal tip in the right upper pelvis and extending superiorly behind the ascending colon with its tip adjacent to the inferior aspect of the right lobe of the liver. Diameter: 10.5 mm Appendicolith: Yes.  There is an 8 mm proximal appendicolith. Mucosal hyper-enhancement: Mild Extraluminal gas: No Periappendiceal collection: No. There is periappendiceal soft tissue stranding. Normal appearing stomach, small bowel and colon. Vascular/Lymphatic: No significant vascular findings are present. No enlarged abdominal or pelvic lymph nodes. Reproductive: Prostate is unremarkable. Other: No abdominal wall hernia or abnormality. No abdominopelvic ascites. Musculoskeletal: Mild levoconvex lumbar scoliosis. Otherwise, normal appearing bones. IMPRESSION: Acute, uncomplicated retrocecal appendicitis. These results were called by telephone at the time of interpretation on 11/14/2017 at 12:23 pm to Dr. SSherwood Gambler, who verbally acknowledged these results. Electronically Signed   By: SClaudie ReveringM.D.   On: 11/14/2017 12:25    Review of Systems  Constitutional: Positive for malaise/fatigue.  HENT: Negative.   Eyes: Negative for blurred vision and double vision.  Respiratory: Negative for cough and shortness of breath.   Cardiovascular: Negative for chest pain.  Gastrointestinal: Positive for abdominal pain, nausea and vomiting.  Genitourinary: Negative.   Musculoskeletal: Negative.   Skin: Negative.    Neurological: Negative.   Endo/Heme/Allergies: Negative.   Psychiatric/Behavioral: Negative.     Blood pressure 139/63, pulse 86, temperature 98.4 F (36.9 C), temperature source Oral, resp. rate 18, height 5' 11"  (1.803 m), weight 81.6 kg (180 lb), SpO2 98 %. Physical Exam  Constitutional: He is oriented to person, place, and time. He appears well-developed and well-nourished.  HENT:  Head: Normocephalic.  Right Ear: External ear normal.  Left Ear: External ear normal.  Mouth/Throat: Oropharynx is clear and moist.  Eyes: Pupils are equal, round, and reactive to light. EOM are normal.  Conjunctival injection bilaterally  Neck: Neck supple.  Cardiovascular: Normal rate, regular rhythm, normal heart sounds and intact distal pulses.  Respiratory: Effort normal and breath sounds normal. No respiratory distress. He has no wheezes.  GI: Soft. He exhibits no distension. There is tenderness. There is no rebound and no guarding.  Musculoskeletal: Normal range of motion.  He exhibits no edema.  Neurological: He is alert and oriented to person, place, and time.  Psychiatric: He has a normal mood and affect.     Assessment/Plan Acute appendicitis -IV Rocephin and Flagyl, will take to the operating room emergently for lap scopic appendectomy.  I discussed the procedure, risks, and benefits with him.  I discussed the expected postoperative course.  Answered his questions.  He is agreeable.  Zenovia Jarred, MD 11/14/2017, 1:44 PM

## 2017-11-14 NOTE — ED Provider Notes (Signed)
[facility] EMERGENCY DEPARTMENT Provider Note   CSN: [id number] Arrival date & time: 11/14/17  2229     History   Chief Complaint Chief Complaint  Patient presents with  . Abdominal Pain    HPI Martin Spencer is a 27 y.o. male.  HPI  27 year old male presents with right-sided abdominal pain.  Started last night.  He immediately took ibuprofen, Aleve, and Imodium.  He has not had any diarrhea.  The pain is been mostly sharp and constant.  No radiation and no back pain.  He felt warm when he was throwing up but has not had a fever otherwise.  He states he made himself throw up one time at around 3 AM and then had an episode of vomiting while in the waiting room. Pain is a 7/10.  No testicular pain.  History reviewed. No pertinent past medical history.  There are no active problems to display for this patient.   History reviewed. No pertinent surgical history.      Home Medications    Prior to Admission medications   Medication Sig Start Date End Date Taking? Authorizing Provider  ADDERALL XR 30 MG 24 hr capsule Take 30 mg by mouth every morning. 11/12/17  Yes [provider]  amphetamine-dextroamphetamine (ADDERALL) 20 MG tablet Take 20 mg by mouth 2 (two) times daily. 11/12/17  Yes [provider]  ibuprofen (ADVIL,MOTRIN) 200 MG tablet Take 200 mg by mouth every 6 (six) hours as needed for fever or moderate pain.   Yes [provider]  loperamide (IMODIUM) 2 MG capsule Take 2 mg by mouth as needed for diarrhea or loose stools.   Yes [provider]  naproxen sodium (ALEVE) 220 MG tablet Take 220 mg by mouth 2 (two) times daily as needed (for pain).   Yes [provider]  Probiotic Product (PRO-BIOTIC BLEND PO) Take 4 tablets by mouth daily as needed (for digestion).   Yes [provider]  diclofenac (CATAFLAM) 50 MG tablet Take 1 tablet (50 mg total) by mouth 3 (three) times daily. Patient not taking:  Reported on 11/14/2017 04/19/16   Billy Fischer, MD    Family History No family history on file.  Social History Social History   Tobacco Use  . Smoking status: Never Smoker  . Smokeless tobacco: Never Used  Substance Use Topics  . Alcohol use: Yes  . Drug use: Not on file     Allergies   Patient has no known allergies.   Review of Systems Review of Systems  Constitutional: Negative for fever.  Gastrointestinal: Positive for abdominal pain, nausea and vomiting. Negative for diarrhea.  Musculoskeletal: Negative for back pain.  All other systems reviewed and are negative.    Physical Exam Updated Vital Signs BP 131/66   Pulse 81   Temp 98.4 F (36.9 C) (Oral)   Resp 18   Ht 5\' 11"  (1.803 m)   Wt 81.6 kg (180 lb)   SpO2 94%   BMI 25.10 kg/m   Physical Exam  Constitutional: He is oriented to person, place, and time. He appears well-developed and well-nourished.  Non-toxic appearance. He does not appear ill.  HENT:  Head: Normocephalic and atraumatic.  Right Ear: External ear normal.  Left Ear: External ear normal.  Nose: Nose normal.  Eyes: Right eye exhibits no discharge. Left eye exhibits no discharge.  Neck: Neck supple.  Cardiovascular: Normal rate, regular rhythm and normal heart sounds.  Pulmonary/Chest: Effort normal and breath  sounds normal.  Abdominal: Soft. There is tenderness in the right lower quadrant.  Musculoskeletal: He exhibits no edema.  Neurological: He is alert and oriented to person, place, and time.  Skin: Skin is warm and dry.  Nursing note and vitals reviewed.    ED Treatments / Results  Labs (all labs ordered are listed, but only abnormal results are displayed) Labs Reviewed  COMPREHENSIVE METABOLIC PANEL - Abnormal; Notable for the following components:      Result Value   Glucose, Bld 113 (*)    ALT 59 (*)    Total Bilirubin 2.5 (*)    All other components within normal limits  CBC - Abnormal; Notable for the following  components:   WBC 16.2 (*)    All other components within normal limits  URINALYSIS, ROUTINE W REFLEX MICROSCOPIC - Abnormal; Notable for the following components:   APPearance CLOUDY (*)    All other components within normal limits  LIPASE, BLOOD    EKG None  Radiology Ct Abdomen Pelvis W Contrast  Result Date: 11/14/2017 CLINICAL DATA:  Right lower quadrant abdominal pain since 9 p.m. last night. Some associated nausea and vomiting last night. EXAM: CT ABDOMEN AND PELVIS WITH CONTRAST TECHNIQUE: Multidetector CT imaging of the abdomen and pelvis was performed using the standard protocol following bolus administration of intravenous contrast. CONTRAST:  116mL OMNIPAQUE IOHEXOL 300 MG/ML  SOLN COMPARISON:  None. FINDINGS: Lower chest: Clear lung bases. Hepatobiliary: No focal liver abnormality is seen. No gallstones, gallbladder wall thickening, or biliary dilatation. Pancreas: Unremarkable. No pancreatic ductal dilatation or surrounding inflammatory changes. Spleen: Normal in size without focal abnormality. Adrenals/Urinary Tract: Adrenal glands are unremarkable. Kidneys are normal, without renal calculi, focal lesion, or hydronephrosis. Bladder is unremarkable. Stomach/Bowel: Changes of appendicitis as follows: Appendix: Location: Retrocecal, originating from the cecal tip in the right upper pelvis and extending superiorly behind the ascending colon with its tip adjacent to the inferior aspect of the right lobe of the liver. Diameter: 10.5 mm Appendicolith: Yes.  There is an 8 mm proximal appendicolith. Mucosal hyper-enhancement: Mild Extraluminal gas: No Periappendiceal collection: No. There is periappendiceal soft tissue stranding. Normal appearing stomach, small bowel and colon. Vascular/Lymphatic: No significant vascular findings are present. No enlarged abdominal or pelvic lymph nodes. Reproductive: Prostate is unremarkable. Other: No abdominal wall hernia or abnormality. No abdominopelvic  ascites. Musculoskeletal: Mild levoconvex lumbar scoliosis. Otherwise, normal appearing bones. IMPRESSION: Acute, uncomplicated retrocecal appendicitis. These results were called by telephone at the time of interpretation on 11/14/2017 at 12:23 pm to Dr. Sherwood Gambler , who verbally acknowledged these results. Electronically Signed   By: Claudie Revering M.D.   On: 11/14/2017 12:25    Procedures Procedures (including critical care time)  Medications Ordered in ED Medications  oxyCODONE-acetaminophen (PERCOCET/ROXICET) 5-325 MG per tablet 1 tablet (1 tablet Oral Given 11/14/17 0627)  cefTRIAXone (ROCEPHIN) 2 g in sodium chloride 0.9 % 100 mL IVPB (2 g Intravenous New Bag/Given 11/14/17 1245)    And  metroNIDAZOLE (FLAGYL) IVPB 500 mg (has no administration in time range)  ondansetron (ZOFRAN-ODT) disintegrating tablet 4 mg (4 mg Oral Given 11/14/17 0600)  morphine 4 MG/ML injection 8 mg (8 mg Intravenous Given 11/14/17 1124)  sodium chloride 0.9 % bolus 1,000 mL (0 mLs Intravenous Stopped 11/14/17 1219)  ondansetron (ZOFRAN) injection 4 mg (4 mg Intravenous Given 11/14/17 1124)  iohexol (OMNIPAQUE) 300 MG/ML solution 100 mL (100 mLs Intravenous Contrast Given 11/14/17 1139)     Initial Impression / Assessment and  Plan / ED Course  I have reviewed the triage vital signs and the nursing notes.  Pertinent labs & imaging results that were available during my care of the patient were reviewed by me and considered in my medical decision making (see chart for details).     CT scan confirms acute appendicitis.  The patient will be given IV Flagyl and IV Rocephin.  Pain is better after IV morphine.  Kept n.p.o. and discussed with Dr. Grandville Silos of general surgery who will admit.  Final Clinical Impressions(s) / ED Diagnoses   Final diagnoses:  Acute appendicitis, uncomplicated    ED Discharge Orders    None       Sherwood Gambler, MD 11/14/17 1254

## 2017-11-14 NOTE — Transfer of Care (Signed)
Immediate Anesthesia Transfer of Care Note  Patient: Martin Spencer  Procedure(s) Performed: APPENDECTOMY LAPAROSCOPIC (N/A Abdomen)  Patient Location: PACU  Anesthesia Type:General  Level of Consciousness: awake, alert  and oriented  Airway & Oxygen Therapy: Patient Spontanous Breathing and Patient connected to nasal cannula oxygen  Post-op Assessment: Report given to RN and Post -op Vital signs reviewed and stable  Post vital signs: Reviewed and stable  Last Vitals:  Vitals Value Taken Time  BP 127/70 11/14/2017  2:57 PM  Temp    Pulse 111 11/14/2017  2:58 PM  Resp 14 11/14/2017  2:59 PM  SpO2 100 % 11/14/2017  2:58 PM  Vitals shown include unvalidated device data.  Last Pain:  Vitals:   11/14/17 0554  TempSrc:   PainSc: 7          Complications: No apparent anesthesia complications

## 2017-11-14 NOTE — Anesthesia Preprocedure Evaluation (Signed)
Anesthesia Evaluation  Patient identified by MRN, date of birth, ID band Patient awake    Reviewed: Allergy & Precautions, H&P , NPO status , Patient's Chart, lab work & pertinent test results  Airway Mallampati: I   Neck ROM: full    Dental   Pulmonary neg pulmonary ROS,    breath sounds clear to auscultation       Cardiovascular negative cardio ROS   Rhythm:regular Rate:Normal     Neuro/Psych    GI/Hepatic   Endo/Other    Renal/GU      Musculoskeletal   Abdominal   Peds  Hematology   Anesthesia Other Findings   Reproductive/Obstetrics                             Anesthesia Physical Anesthesia Plan  ASA: II and emergent  Anesthesia Plan: General   Post-op Pain Management:    Induction: Intravenous  PONV Risk Score and Plan: 2 and Ondansetron, Dexamethasone, Midazolam and Treatment may vary due to age or medical condition  Airway Management Planned: Oral ETT  Additional Equipment:   Intra-op Plan:   Post-operative Plan: Extubation in OR  Informed Consent: I have reviewed the patients History and Physical, chart, labs and discussed the procedure including the risks, benefits and alternatives for the proposed anesthesia with the patient or authorized representative who has indicated his/her understanding and acceptance.     Plan Discussed with: CRNA, Anesthesiologist and Surgeon  Anesthesia Plan Comments:         Anesthesia Quick Evaluation

## 2017-11-15 ENCOUNTER — Encounter (HOSPITAL_COMMUNITY): Payer: Self-pay | Admitting: General Surgery

## 2017-11-15 LAB — CBC
HEMATOCRIT: 39 % (ref 39.0–52.0)
Hemoglobin: 13.2 g/dL (ref 13.0–17.0)
MCH: 29.6 pg (ref 26.0–34.0)
MCHC: 33.8 g/dL (ref 30.0–36.0)
MCV: 87.4 fL (ref 78.0–100.0)
PLATELETS: 198 10*3/uL (ref 150–400)
RBC: 4.46 MIL/uL (ref 4.22–5.81)
RDW: 12.3 % (ref 11.5–15.5)
WBC: 18.3 10*3/uL — AB (ref 4.0–10.5)

## 2017-11-15 LAB — BASIC METABOLIC PANEL
ANION GAP: 7 (ref 5–15)
BUN: 8 mg/dL (ref 6–20)
CO2: 27 mmol/L (ref 22–32)
Calcium: 8.9 mg/dL (ref 8.9–10.3)
Chloride: 105 mmol/L (ref 98–111)
Creatinine, Ser: 0.86 mg/dL (ref 0.61–1.24)
Glucose, Bld: 132 mg/dL — ABNORMAL HIGH (ref 70–99)
Potassium: 4.3 mmol/L (ref 3.5–5.1)
Sodium: 139 mmol/L (ref 135–145)

## 2017-11-15 MED ORDER — AMPHETAMINE-DEXTROAMPHETAMINE 10 MG PO TABS
20.0000 mg | ORAL_TABLET | Freq: Two times a day (BID) | ORAL | Status: DC
  Administered 2017-11-15: 20 mg via ORAL
  Filled 2017-11-15: qty 2

## 2017-11-15 MED ORDER — IBUPROFEN 200 MG PO TABS: ORAL_TABLET | ORAL | 0 refills | Status: DC

## 2017-11-15 MED ORDER — OXYCODONE HCL 5 MG PO TABS: 5.0000 mg | ORAL_TABLET | Freq: Four times a day (QID) | ORAL | 0 refills | Status: DC | PRN

## 2017-11-15 MED ORDER — AMPHETAMINE-DEXTROAMPHET ER 10 MG PO CP24
30.0000 mg | ORAL_CAPSULE | Freq: Every day | ORAL | Status: DC
  Administered 2017-11-15: 30 mg via ORAL
  Filled 2017-11-15: qty 3

## 2017-11-15 MED ORDER — ACETAMINOPHEN 500 MG PO TABS: ORAL_TABLET | ORAL | 0 refills | Status: DC

## 2017-11-15 MED ORDER — IBUPROFEN 600 MG PO TABS: 600.0000 mg | ORAL_TABLET | Freq: Four times a day (QID) | ORAL | Status: DC | PRN

## 2017-11-15 NOTE — Progress Notes (Deleted)
Physician Discharge Summary  Patient ID: Martin Spencer MRN: [id number] DOB/AGE: 1990-05-28 27 y.o.  Admit date: 11/14/2017 Discharge date: 11/15/2017  Admission Diagnoses:   Acute appendicitis ADHD Discharge Diagnoses:  Acute appendicitis ADHD  Active Problems:   Acute appendicitis   PROCEDURES: Laparoscopic appendectomy 11/14/2017  Hospital Course:   Martin Spencer presents complaining of right lower quadrant abdominal pain since yesterday.  It is associated with nausea.  It is persistently gotten worse.  He was evaluated in the emergency department and was found to have a leukocytosis of 16,200 and CT scan of the abdomen and pelvis consistent with acute appendicitis.    He was seen in the emergency department by Dr. Grandville Silos and admitted for acute appendicitis.  Taken the operating room and underwent laparoscopic appendectomy.  The appendix was inflamed but not perforated.  Postop is done fairly well.  He is taken a large amount of pain medications.  By the time I saw him he had eaten full liquids and we are advancing to a regular diet.  He is anxious to go home.  I resumed his Adderall.  His mother was concerned about taking him home while requiring so much pain medication.  I told him they can go home when everyone felt he was ready to go.  Criteria includes: able to tolerate a soft diet, pain control with plain oral medications.  Ambulating without difficulty.  He is already voided without issue. His port sites all look good and when pain control is stable he can go home.  Follow-up as listed below.  I discussed discharge instructions with the patient his mother and his girlfriend.  I have personally reviewed the patients medication history on the [place] controlled substance database.  CBC Latest Ref Rng & Units 11/15/2017 11/14/2017  WBC 4.0 - 10.5 K/uL 18.3(H) 16.2(H)  Hemoglobin 13.0 - 17.0 g/dL 13.2 16.3  Hematocrit 39.0 - 52.0 % 39.0 46.7  Platelets 150 - 400 K/uL 198 200   CMP Latest Ref  Rng & Units 11/15/2017 11/14/2017  Glucose 70 - 99 mg/dL 132(H) 113(H)  BUN 6 - 20 mg/dL 8 11  Creatinine 0.61 - 1.24 mg/dL 0.86 0.90  Sodium 135 - 145 mmol/L 139 139  Potassium 3.5 - 5.1 mmol/L 4.3 4.1  Chloride 98 - 111 mmol/L 105 104  CO2 22 - 32 mmol/L 27 28  Calcium 8.9 - 10.3 mg/dL 8.9 10.0  Total Protein 6.5 - 8.1 g/dL - 7.5  Total Bilirubin 0.3 - 1.2 mg/dL - 2.5(H)  Alkaline Phos 38 - 126 U/L - 48  AST 15 - 41 U/L - 41  ALT 0 - 44 U/L - 59(H)   Condition on discharge: Improved  Disposition: Discharge disposition: 01-Home or Self Care        Allergies as of 11/15/2017   No Known Allergies     Medication List    STOP taking these medications   diclofenac 50 MG tablet Commonly known as:  CATAFLAM   naproxen sodium 220 MG tablet Commonly known as:  ALEVE     TAKE these medications   acetaminophen 500 MG tablet Commonly known as:  TYLENOL You could take 2 tablets every 6 hours as needed for pain.  Be your first line for pain control.  Do not exceed 4000 mg/day it can harm your liver.  You can buy this over-the-counter at any drugstore.   ADDERALL XR 30 MG 24 hr capsule Generic drug:  amphetamine-dextroamphetamine Take 30 mg by mouth every morning.  amphetamine-dextroamphetamine 20 MG tablet Commonly known as:  ADDERALL Take 20 mg by mouth 2 (two) times daily.   ibuprofen 200 MG tablet Commonly known as:  ADVIL,MOTRIN Can take 2 to 3 tablets every 6 hours as needed for pain do not exceed this limit.  This should be your second line for pain control.  You can use the Aleve if you prefer that but she cannot use both Aleve and ibuprofen.  You can buy this over-the-counter at any drugstore. What changed:    how much to take  how to take this  when to take this  reasons to take this  additional instructions   loperamide 2 MG capsule Commonly known as:  IMODIUM Take 2 mg by mouth as needed for diarrhea or loose stools.   oxyCODONE 5 MG immediate  release tablet Commonly known as:  Oxy IR/ROXICODONE Take 1 tablet (5 mg total) by mouth every 6 (six) hours as needed for moderate pain or severe pain.   PRO-BIOTIC BLEND PO Take 4 tablets by mouth daily as needed (for digestion).      Follow-up Information    [facility] Follow up on 11/30/2017.   Specialty:  General Surgery Why:  Appointment is at 9:15 AM.  Be at the office 30 minutes early for check-in, bring photo ID and insurance information. Contact information: [address] [place] [address] [phone]           Signed: Earnstine Regal 11/15/2017, 11:20 AM

## 2017-11-15 NOTE — Progress Notes (Signed)
Discharge instructions reviewed with pt and pt's father and prescription given.  Pt verbalized understanding and questions answered.  Pt discharged in stable condition via wheelchair with father.  Martin Spencer

## 2017-11-15 NOTE — Discharge Summary (Signed)
Patient ID: Martin Spencer MRN: [id number] DOB/AGE: 30-Nov-1990 27 y.o.  Admit date: 11/14/2017 Discharge date: 11/15/2017  Admission Diagnoses:   Acute appendicitis ADHD Discharge Diagnoses:  Acute appendicitis ADHD  Active Problems:   Acute appendicitis   PROCEDURES: Laparoscopic appendectomy 11/14/2017  Hospital Course:  Devincent presents complaining of right lower quadrant abdominal pain since yesterday. It is associated with nausea. It is persistently gotten worse. He was evaluated in the emergency department and was found to have a leukocytosis of 16,200 and CT scan of the abdomen and pelvis consistent with acute appendicitis.   He was seen in the emergency department by Dr. Grandville Silos and admitted for acute appendicitis.  Taken the operating room and underwent laparoscopic appendectomy.  The appendix was inflamed but not perforated.  Postop is done fairly well.  He is taken a large amount of pain medications.  By the time I saw him he had eaten full liquids and we are advancing to a regular diet.  He is anxious to go home.  I resumed his Adderall.  His mother was concerned about taking him home while requiring so much pain medication.  I told him they can go home when everyone felt he was ready to go.  Criteria includes: able to tolerate a soft diet, pain control with plain oral medications.  Ambulating without difficulty.  He is already voided without issue. His port sites all look good and when pain control is stable he can go home.  Follow-up as listed below.  I discussed discharge instructions with the patient his mother and his girlfriend.  I have personally reviewed the patients medication history on the [place] controlled substance database.  CBC Latest Ref Rng & Units 11/15/2017 11/14/2017  WBC 4.0 - 10.5 K/uL 18.3(H) 16.2(H)  Hemoglobin 13.0 - 17.0 g/dL 13.2 16.3  Hematocrit 39.0 - 52.0 % 39.0 46.7  Platelets 150 - 400 K/uL 198 200   CMP Latest Ref Rng & Units 11/15/2017  11/14/2017  Glucose 70 - 99 mg/dL 132(H) 113(H)  BUN 6 - 20 mg/dL 8 11  Creatinine 0.61 - 1.24 mg/dL 0.86 0.90  Sodium 135 - 145 mmol/L 139 139  Potassium 3.5 - 5.1 mmol/L 4.3 4.1  Chloride 98 - 111 mmol/L 105 104  CO2 22 - 32 mmol/L 27 28  Calcium 8.9 - 10.3 mg/dL 8.9 10.0  Total Protein 6.5 - 8.1 g/dL - 7.5  Total Bilirubin 0.3 - 1.2 mg/dL - 2.5(H)  Alkaline Phos 38 - 126 U/L - 48  AST 15 - 41 U/L - 41  ALT 0 - 44 U/L - 59(H)   Condition on discharge: Improved  Disposition: Discharge disposition: 01-Home or Self Care       Allergies as of 11/15/2017   No Known Allergies        Medication List    STOP taking these medications   diclofenac 50 MG tablet Commonly known as:  CATAFLAM   naproxen sodium 220 MG tablet Commonly known as:  ALEVE     TAKE these medications   acetaminophen 500 MG tablet Commonly known as:  TYLENOL You could take 2 tablets every 6 hours as needed for pain.  Be your first line for pain control.  Do not exceed 4000 mg/day it can harm your liver.  You can buy this over-the-counter at any drugstore.   ADDERALL XR 30 MG 24 hr capsule Generic drug:  amphetamine-dextroamphetamine Take 30 mg by mouth every morning.   amphetamine-dextroamphetamine 20 MG tablet Commonly known as:  ADDERALL Take 20 mg by mouth 2 (two) times daily.   ibuprofen 200 MG tablet Commonly known as:  ADVIL,MOTRIN Can take 2 to 3 tablets every 6 hours as needed for pain do not exceed this limit.  This should be your second line for pain control.  You can use the Aleve if you prefer that but she cannot use both Aleve and ibuprofen.  You can buy this over-the-counter at any drugstore. What changed:    how much to take  how to take this  when to take this  reasons to take this  additional instructions   loperamide 2 MG capsule Commonly known as:  IMODIUM Take 2 mg by mouth as needed for diarrhea or loose stools.   oxyCODONE 5 MG immediate release  tablet Commonly known as:  Oxy IR/ROXICODONE Take 1 tablet (5 mg total) by mouth every 6 (six) hours as needed for moderate pain or severe pain.   PRO-BIOTIC BLEND PO Take 4 tablets by mouth daily as needed (for digestion).         Follow-up Information    [facility] Follow up on 11/30/2017.   Specialty:  General Surgery Why:  Appointment is at 9:15 AM.  Be at the office 30 minutes early for check-in, bring photo ID and insurance information. Contact information: [address] [place] [address] [phone]           Signed: Earnstine Regal 11/15/2017, 11:20 AM

## 2017-11-15 NOTE — Progress Notes (Addendum)
1 Day Post-Op    CC:  Abdominal pain  Subjective: Patient is taking a lot of pain but wants to go home.  His mom is rather concerned.  He is ready to go and his girlfriend is ready to go also.  Objective: Vital signs in last 24 hours: Temp:  [97.6 F (36.4 C)-98.7 F (37.1 C)] 97.6 F (36.4 C) (07/22 0550) Pulse Rate:  [64-103] 64 (07/22 0550) Resp:  [11-26] 18 (07/22 0550) BP: (107-141)/(55-76) 110/61 (07/22 0550) SpO2:  [93 %-100 %] 99 % (07/22 0550) Weight:  [81.6 kg (180 lb)] 81.6 kg (180 lb) (07/21 1612) Last BM Date: 11/13/17 Afebrile vital signs are stable WBC still elevated 18.3.,  BMP is normal Pain control oxycodone 20 mg p.o. yesterday and 20 mg so far today Morphine 8 mg IV today, Neurontin 300 mg x 1 today Celebrex 200 mg twice daily yesterday and 1 dose today Tylenol 2 g yesterday and 1 g so far today. Intake/Output from previous day: 07/21 0701 - 07/22 0700 In: 3095.7 [P.O.:860; I.V.:835.7; IV Piggyback:1100] Out: 205 [Urine:200; Blood:5] Intake/Output this shift: No intake/output data recorded.  General appearance: alert, cooperative and no distress Resp: clear to auscultation bilaterally GI: Port sites all look good.  Abdomen sore but otherwise normal-appearing.  Lab Results:  Recent Labs    11/14/17 0620 11/15/17 0559  WBC 16.2* 18.3*  HGB 16.3 13.2  HCT 46.7 39.0  PLT 200 198    BMET Recent Labs    11/14/17 0620 11/15/17 0559  NA 139 139  K 4.1 4.3  CL 104 105  CO2 28 27  GLUCOSE 113* 132*  BUN 11 8  CREATININE 0.90 0.86  CALCIUM 10.0 8.9   PT/INR No results for input(s): LABPROT, INR in the last 72 hours.  Recent Labs  Lab 11/14/17 0620  AST 41  ALT 59*  ALKPHOS 48  BILITOT 2.5*  PROT 7.5  ALBUMIN 4.4     Lipase     Component Value Date/Time   LIPASE 31 11/14/2017 0620   Prior to Admission medications   Medication Sig Start Date End Date Taking? Authorizing Provider  ADDERALL XR 30 MG 24 hr capsule Take 30 mg by  mouth every morning. 11/12/17  Yes [provider]  amphetamine-dextroamphetamine (ADDERALL) 20 MG tablet Take 20 mg by mouth 2 (two) times daily. 11/12/17  Yes [provider]  ibuprofen (ADVIL,MOTRIN) 200 MG tablet Take 200 mg by mouth every 6 (six) hours as needed for fever or moderate pain.   Yes [provider]  loperamide (IMODIUM) 2 MG capsule Take 2 mg by mouth as needed for diarrhea or loose stools.   Yes [provider]  naproxen sodium (ALEVE) 220 MG tablet Take 220 mg by mouth 2 (two) times daily as needed (for pain).   Yes [provider]  Probiotic Product (PRO-BIOTIC BLEND PO) Take 4 tablets by mouth daily as needed (for digestion).   Yes [provider]  diclofenac (CATAFLAM) 50 MG tablet Take 1 tablet (50 mg total) by mouth 3 (three) times daily. Patient not taking: Reported on 11/14/2017 04/19/16   Billy Fischer, MD      Medications: . acetaminophen  1,000 mg Oral Q6H  . celecoxib  200 mg Oral BID  . enoxaparin (LOVENOX) injection  40 mg Subcutaneous Q24H  . feeding supplement  237 mL Oral BID BM  . gabapentin  300 mg Oral BID   . dextrose 5 % and 0.45 % NaCl with  KCl 20 mEq/L Stopped (11/15/17 0551)   Anti-infectives (From admission, onward)   Start     Dose/Rate Route Frequency Ordered Stop   11/14/17 1245  cefTRIAXone (ROCEPHIN) 2 g in sodium chloride 0.9 % 100 mL IVPB     2 g 200 mL/hr over 30 Minutes Intravenous  Once 11/14/17 1239 11/14/17 1318   11/14/17 1245  metroNIDAZOLE (FLAGYL) IVPB 500 mg     500 mg 100 mL/hr over 60 Minutes Intravenous  Once 11/14/17 1239 11/14/17 1415      Assessment/Plan ADHD  Appendicitis Laparoscopic appendectomy 11/14/2017, Dr. Georganna Skeans  FEN:: IV fluids/regular diet ID: Preop Rocephin/Flagyl DVT: Lovenox Follow-up: [facility]   Plan: I have asked the nurses to advance him to a regular diet.  His girlfriend has [employer] in the room.  His sites all look good.  We had  a long discussion about discharge and I told him I would let him go home when he feels he is ready. I have personally reviewed the patients medication history on the [place] controlled substance database.   LOS: 0 days    Maripaz Mullan 11/15/2017 [phone]

## 2017-11-15 NOTE — Discharge Instructions (Signed)
[facility] ______[facility] °LAPAROSCOPIC SURGERY: POST OP INSTRUCTIONS °Always review your discharge instruction sheet given to you by the facility where your surgery was performed. °IF YOU HAVE DISABILITY OR FAMILY LEAVE FORMS, YOU MUST BRING THEM TO THE OFFICE FOR PROCESSING.   °DO NOT GIVE THEM TO YOUR DOCTOR. ° °1. A prescription for pain medication may be given to you upon discharge.  Take your pain medication as prescribed, if needed.  If narcotic pain medicine is not needed, then you may take acetaminophen (Tylenol) or ibuprofen (Advil) as needed. °2. Take your usually prescribed medications unless otherwise directed. °3. If you need a refill on your pain medication, please contact your pharmacy.  They will contact our office to request authorization. Prescriptions will not be filled after 5pm or on week-ends. °4. You should follow a light diet the first few days after arrival home, such as soup and crackers, etc.  Be sure to include lots of fluids daily. °5. Most patients will experience some swelling and bruising in the area of the incisions.  Ice packs will help.  Swelling and bruising can take several days to resolve.  °6. It is common to experience some constipation if taking pain medication after surgery.  Increasing fluid intake and taking a stool softener (such as Colace) will usually help or prevent this problem from occurring.  A mild laxative (Milk of Magnesia or Miralax) should be taken according to package instructions if there are no bowel movements after 48 hours. °7. Unless discharge instructions indicate otherwise, you may remove your bandages 24-48 hours after surgery, and you may shower at that time.  You may have steri-strips (small skin tapes) in place directly over the incision.  These strips should be left on the skin for 7-10 days.  If your surgeon used skin glue on the incision, you may shower in 24 hours.  The glue will flake off over the next 2-3 weeks.  Any sutures or  staples will be removed at the office during your follow-up visit. °8. ACTIVITIES:  You may resume regular (light) daily activities beginning the next day--such as daily self-care, walking, climbing stairs--gradually increasing activities as tolerated.  You may have sexual intercourse when it is comfortable.  Refrain from any heavy lifting or straining until approved by your doctor. °a. You may drive when you are no longer taking prescription pain medication, you can comfortably wear a seatbelt, and you can safely maneuver your car and apply brakes. °b. RETURN TO WORK:  __________________________________________________________ °9. You should see your doctor in the office for a follow-up appointment approximately 2-3 weeks after your surgery.  Make sure that you call for this appointment within a day or two after you arrive home to insure a convenient appointment time. °10. OTHER INSTRUCTIONS: __________________________________________________________________________________________________________________________ __________________________________________________________________________________________________________________________ °WHEN TO CALL YOUR DOCTOR: °1. Fever over 101.0 °2. Inability to urinate °3. Continued bleeding from incision. °4. Increased pain, redness, or drainage from the incision. °5. Increasing abdominal pain ° °The clinic staff is available to answer your questions during regular business hours.  Please don’t hesitate to call and ask to speak to one of the nurses for clinical concerns.  If you have a medical emergency, go to the nearest emergency room or call 911.  A surgeon from [facility] is always on call at the hospital. °[address], [address], [place], [place]  [address] ? [address] [address], [place], [place]   [address] °[phone] ? [phone] ? FAX [phone] °Web site:   [email or web address] ° ° °Laparoscopic Appendectomy, Adult, Care After °Refer to  this sheet in the next few weeks. These instructions provide you with information about caring for yourself after your procedure. Your health care provider may also give you more specific instructions. Your treatment has been planned according to current medical practices, but problems sometimes occur. Call your health care provider if you have any problems or questions after your procedure. °What can I expect after the procedure? °After the procedure, it is common to have: °· A decrease in your energy level. °· Mild pain in the area where the surgical cuts (incisions) were made. °· Constipation. This can be caused by pain medicine and a decrease in your activity. ° °Follow these instructions at home: °Medicines °· Take over-the-counter and prescription medicines only as told by your health care provider. °· Do not drive for 24 hours if you received a sedative. °· Do not drive or operate heavy machinery while taking prescription pain medicine. °· If you were prescribed an antibiotic medicine, take it as told by your health care provider. Do not stop taking the antibiotic even if you start to feel better. °Activity °· For 3 weeks or as long as told by your health care provider: °? Do not lift anything that is heavier than 10 pounds (4.5 kg). °? Do not play contact sports. °· Gradually return to your normal activities. Ask your health care provider what activities are safe for you. °Bathing °· Keep your incisions clean and dry. Clean them as often as told by your health care provider: °? Gently wash the incisions with soap and water. °? Rinse the incisions with water to remove all soap. °? Pat the incisions dry with a clean towel. Do not rub the incisions. °· You may take showers after 48 hours. °· Do not take baths, swim, or use hot tubs for 2 weeks or as told by your health care provider. °Incision care °· Follow instructions from your healthcare provider about how to take care of your incisions. Make sure  you: °? Wash your hands with soap and water before you change your bandage (dressing). If soap and water are not available, use hand sanitizer. °? Change your dressing as told by your health care provider. °? Leave stitches (sutures), skin glue, or adhesive strips in place. These skin closures may need to stay in place for 2 weeks or longer. If adhesive strip edges start to loosen and curl up, you may trim the loose edges. Do not remove adhesive strips completely unless your health care provider tells you to do that. °· Check your incision areas every day for signs of infection. Check for: °? More redness, swelling, or pain. °? More fluid or blood. °? Warmth. °? Pus or a bad smell. °Other Instructions °· If you were sent home with a drain, follow instructions from your health care provider about how to care for the drain and how to empty it. °· Take deep breaths. This helps to prevent your lungs from becoming inflamed. °· To relieve and prevent constipation: °? Drink plenty of fluids. °? Eat plenty of fruits and vegetables. °· Keep all follow-up visits as told by your health care provider. This is important. °Contact a health care provider if: °· You have more redness, swelling, or pain around an incision. °· You have more fluid or blood coming from an incision. °· Your incision feels warm to the touch. °· You have pus or a bad smell coming from an incision or   dressing. °· Your incision edges break open after your sutures have been removed. °· You have increasing pain in your shoulders. °· You feel dizzy or you faint. °· You develop shortness of breath. °· You keep feeling nauseous or vomiting. °· You have diarrhea or you cannot control your bowel functions. °· You lose your appetite. °· You develop swelling or pain in your legs. °Get help right away if: °· You have a fever. °· You develop a rash. °· You have difficulty breathing. °· You have sharp pains in your chest. °This information is not intended to replace  advice given to you by your health care provider. Make sure you discuss any questions you have with your health care provider. °Document Released: 04/13/2005 Document Revised: 09/13/2015 Document Reviewed: 10/01/2014 °Elsevier Interactive Patient Education © 2018 [employer] ° °

## 2017-11-15 NOTE — Care Management Note (Signed)
Case Management Note  Patient Details  Name: Martin Spencer MRN: [id number] Date of Birth: Feb 18, 1991  Subjective/Objective:                    Action/Plan: Pt discharging home with self care. Pt has no PCP. CM provided number for [facility] for assistance in finding a PCP after d/c.  Pt has transportation home.   Expected Discharge Date:  11/15/17               Expected Discharge Plan:  Home/Self Care  In-House Referral:     Discharge planning Services     Post Acute Care Choice:    Choice offered to:     DME Arranged:    DME Agency:     HH Arranged:    HH Agency:     Status of Service:  Completed, signed off  If discussed at [employer] of Stay Meetings, dates discussed:    Additional Comments:  Pollie Friar, RN 11/15/2017, 2:07 PM

## 2017-11-15 NOTE — Plan of Care (Signed)
  Problem: Pain Managment: Goal: General experience of comfort will improve Outcome: Progressing   

## 2017-11-16 NOTE — Anesthesia Postprocedure Evaluation (Signed)
Anesthesia Post Note  Patient: Martin Spencer  Procedure(s) Performed: APPENDECTOMY LAPAROSCOPIC (N/A Abdomen)     Patient location during evaluation: PACU Anesthesia Type: General Level of consciousness: awake and alert Pain management: pain level controlled Vital Signs Assessment: post-procedure vital signs reviewed and stable Respiratory status: spontaneous breathing, nonlabored ventilation, respiratory function stable and patient connected to nasal cannula oxygen Cardiovascular status: blood pressure returned to baseline and stable Postop Assessment: no apparent nausea or vomiting Anesthetic complications: no    Last Vitals:  Vitals:   11/15/17 0550 11/15/17 1344  BP: 110/61 121/67  Pulse: 64 82  Resp: 18 18  Temp: 36.4 C 36.7 C  SpO2: 99% 100%    Last Pain:  Vitals:   11/15/17 1440  TempSrc:   PainSc: 2                  Demaya Hardge S

## 2017-12-26 ENCOUNTER — Encounter (HOSPITAL_COMMUNITY): Payer: Self-pay | Admitting: Emergency Medicine

## 2017-12-26 ENCOUNTER — Emergency Department (HOSPITAL_COMMUNITY)
Admission: EM | Admit: 2017-12-26 | Discharge: 2017-12-26 | Disposition: A | Discharge status: Home / Self Care | Payer: BLUE CROSS/BLUE SHIELD | Attending: Emergency Medicine | Admitting: Emergency Medicine

## 2017-12-26 ENCOUNTER — Other Ambulatory Visit: Payer: Self-pay

## 2017-12-26 DIAGNOSIS — Z79899 Other long term (current) drug therapy: Secondary | ICD-10-CM | POA: Insufficient documentation

## 2017-12-26 DIAGNOSIS — M545 Low back pain, unspecified: Secondary | ICD-10-CM

## 2017-12-26 MED ORDER — NAPROXEN 500 MG PO TBEC: 500.0000 mg | DELAYED_RELEASE_TABLET | Freq: Two times a day (BID) | ORAL | 0 refills | Status: AC

## 2017-12-26 MED ORDER — METHOCARBAMOL 500 MG PO TABS
500.0000 mg | ORAL_TABLET | Freq: Once | ORAL | Status: AC
Start: 2017-12-26 — End: 2017-12-26
  Administered 2017-12-26: 500 mg via ORAL
  Filled 2017-12-26: qty 1

## 2017-12-26 MED ORDER — METHOCARBAMOL 500 MG PO TABS: 500.0000 mg | ORAL_TABLET | Freq: Two times a day (BID) | ORAL | 0 refills | Status: DC

## 2017-12-26 MED ORDER — ACETAMINOPHEN 500 MG PO TABS
1000.0000 mg | ORAL_TABLET | Freq: Once | ORAL | Status: AC
  Administered 2017-12-26: 1000 mg via ORAL
  Filled 2017-12-26: qty 2
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
## 2018-01-03 DIAGNOSIS — M545 Low back pain: Secondary | ICD-10-CM | POA: Diagnosis not present

## 2018-01-05 ENCOUNTER — Encounter: Payer: Self-pay | Admitting: Physical Therapy

## 2018-01-05 ENCOUNTER — Ambulatory Visit: Payer: BLUE CROSS/BLUE SHIELD | Attending: Family Medicine | Admitting: Physical Therapy

## 2018-01-05 DIAGNOSIS — R2689 Other abnormalities of gait and mobility: Secondary | ICD-10-CM | POA: Insufficient documentation

## 2018-01-05 DIAGNOSIS — M25562 Pain in left knee: Secondary | ICD-10-CM | POA: Diagnosis not present
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
## 2018-01-05 NOTE — Patient Instructions (Signed)
Access Code: [email or web address]  URL: [email or web address]  Date: 01/05/2018  Prepared by: Lyndee Hensen   Exercises  Supine Heel Slides - 10 reps - 2 sets - 2x daily  Long Sitting Quad Set - 10 reps - 2 sets - 2x daily  Seated Long Arc Quad - 10 reps - 2 sets - 2x daily  Straight Leg Raise - 10 reps - 2 sets - 2x daily  Sidelying Hip Abduction - 10 reps - 2 sets - 2x daily

## 2018-01-11 ENCOUNTER — Encounter: Payer: BLUE CROSS/BLUE SHIELD | Admitting: Physical Therapy

## 2018-01-13 ENCOUNTER — Ambulatory Visit: Payer: BLUE CROSS/BLUE SHIELD | Admitting: Physical Therapy

## 2018-01-20 ENCOUNTER — Encounter (HOSPITAL_COMMUNITY): Payer: Self-pay | Admitting: Emergency Medicine

## 2018-01-20 ENCOUNTER — Ambulatory Visit (HOSPITAL_COMMUNITY)
Admission: EM | Admit: 2018-01-20 | Discharge: 2018-01-20 | Disposition: A | Discharge status: Home / Self Care | Payer: BLUE CROSS/BLUE SHIELD | Attending: Family Medicine | Admitting: Family Medicine

## 2018-01-20 DIAGNOSIS — J40 Bronchitis, not specified as acute or chronic: Secondary | ICD-10-CM

## 2018-01-20 MED ORDER — PREDNISONE 20 MG PO TABS: ORAL_TABLET | ORAL | 0 refills | Status: DC

## 2018-01-20 MED ORDER — ALBUTEROL SULFATE HFA 108 (90 BASE) MCG/ACT IN AERS: 2.0000 | INHALATION_SPRAY | RESPIRATORY_TRACT | 1 refills | Status: DC | PRN

## 2018-01-20 NOTE — ED Provider Notes (Signed)
[place]    CSN: [id number] Arrival date & time: 01/20/18  1629     History   Chief Complaint Chief Complaint  Patient presents with  . Cough    HPI Martin Spencer is a 27 y.o. male.   This 27 year old man who presents with 5 weeks of cough.    Patient has a history of asthma as a child.  During this 5 weeks she is been able to continue his work schedule without problem.  He still exercising.  He has been running no fever.  Cough is nonproductive.  Patient suspects that the problem may be that he works in a "sick building".  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  Patient takes Adderall.   (Patient has a history of substance abuse and has been through rehab and is clean now).     History reviewed. No pertinent past medical history.  Patient Active Problem List   Diagnosis Date Noted  . Acute appendicitis 11/14/2017    Past Surgical History:  Procedure Laterality Date  . APPENDECTOMY    . LAPAROSCOPIC APPENDECTOMY N/A 11/14/2017   Procedure: APPENDECTOMY LAPAROSCOPIC;  Surgeon: Georganna Skeans, MD;  Location: [place];  Service: General;  Laterality: N/A;       Home Medications    Prior to Admission medications   Medication Sig Start Date End Date Taking? Authorizing Provider  ADDERALL XR 30 MG 24 hr capsule Take 30 mg by mouth every morning. 11/12/17  Yes [provider]  amphetamine-dextroamphetamine (ADDERALL) 20 MG tablet Take 20 mg by mouth 2 (two) times daily. 11/12/17  Yes [provider]  albuterol (PROVENTIL HFA;VENTOLIN HFA) 108 (90 Base) MCG/ACT inhaler Inhale 2 puffs into the lungs every 4 (four) hours as needed for wheezing or shortness of breath (cough, shortness of breath or wheezing.). 01/20/18   Robyn Haber, MD  predniSONE (DELTASONE) 20 MG tablet One daily with food 01/20/18   Robyn Haber, MD  Probiotic Product (PRO-BIOTIC BLEND PO) Take 4 tablets by mouth daily as needed (for  digestion).    [provider]    Family History No family history on file.  Social History Social History   Tobacco Use  . Smoking status: Never Smoker  . Smokeless tobacco: Never Used  Substance Use Topics  . Alcohol use: Yes  . Drug use: Not on file     Allergies   Patient has no known allergies.   Review of Systems Review of Systems  Constitutional: Negative.   Respiratory: Positive for cough and wheezing.   All other systems reviewed and are negative.    Physical Exam Triage Vital Signs ED Triage Vitals  Enc Vitals Group     BP 01/20/18 1655 116/74     Pulse Rate 01/20/18 1655 85     Resp 01/20/18 1655 16     Temp 01/20/18 1655 98.5 F (36.9 C)     Temp Source 01/20/18 1655 Oral     SpO2 01/20/18 1655 99 %     Weight 01/20/18 1654 180 lb (81.6 kg)     Height --      Head Circumference --      Peak Flow --      Pain Score 01/20/18 1654 3     Pain Loc --      Pain Edu? --      Excl. in [place]? --    No data found.  Updated Vital Signs BP 116/74  Pulse 85   Temp 98.5 F (36.9 C) (Oral)   Resp 16   Wt 81.6 kg   SpO2 99%   BMI 25.10 kg/m   Visual Acuity Right Eye Distance:   Left Eye Distance:   Bilateral Distance:    Right Eye Near:   Left Eye Near:    Bilateral Near:     Physical Exam  Constitutional: He is oriented to person, place, and time. He appears well-developed and well-nourished.  HENT:  Right Ear: External ear normal.  Left Ear: External ear normal.  Mouth/Throat: Oropharynx is clear and moist.  Eyes: Pupils are equal, round, and reactive to light. Conjunctivae are normal.  Neck: Normal range of motion. Neck supple.  Cardiovascular: Normal rate, regular rhythm and normal heart sounds.  Pulmonary/Chest: Effort normal. He has wheezes.  Several very faint wheezes in both lung fields  Musculoskeletal: Normal range of motion.  Lymphadenopathy:    He has no cervical adenopathy.  Neurological: He is alert and oriented to  person, place, and time.  Skin: Skin is warm and dry.  Nursing note and vitals reviewed.    UC Treatments / Results  Labs (all labs ordered are listed, but only abnormal results are displayed) Labs Reviewed - No data to display  EKG None  Radiology No results found.  Procedures Procedures (including critical care time)  Medications Ordered in UC Medications - No data to display  Initial Impression / Assessment and Plan / UC Course  I have reviewed the triage vital signs and the nursing notes.  Pertinent labs & imaging results that were available during my care of the patient were reviewed by me and considered in my medical decision making (see chart for details).    Final Clinical Impressions(s) / UC Diagnoses   Final diagnoses:  Bronchitis     Discharge Instructions     Call me if you are not significantly better by Sunday morning.  We could at that point consider a Z-pak.       ED Prescriptions    Medication Sig Dispense Auth. Provider   albuterol (PROVENTIL HFA;VENTOLIN HFA) 108 (90 Base) MCG/ACT inhaler Inhale 2 puffs into the lungs every 4 (four) hours as needed for wheezing or shortness of breath (cough, shortness of breath or wheezing.). 1 Inhaler Robyn Haber, MD   predniSONE (DELTASONE) 20 MG tablet One daily with food 5 tablet Robyn Haber, MD     Controlled Substance Prescriptions [place] Controlled Substance Registry consulted? Not Applicable   Robyn Haber, MD 01/20/18 1725

## 2018-01-20 NOTE — ED Triage Notes (Signed)
PT reports cough for 5 weeks.

## 2018-01-20 NOTE — Discharge Instructions (Addendum)
Call me if you are not significantly better by Sunday morning.  We could at that point consider a Z-pak.

## 2018-01-25 ENCOUNTER — Encounter: Payer: BLUE CROSS/BLUE SHIELD | Admitting: Physical Therapy

## 2018-02-06 DIAGNOSIS — R05 Cough: Secondary | ICD-10-CM | POA: Diagnosis not present

## 2018-02-06 DIAGNOSIS — J209 Acute bronchitis, unspecified: Secondary | ICD-10-CM | POA: Diagnosis not present

## 2018-02-10 ENCOUNTER — Encounter: Payer: BLUE CROSS/BLUE SHIELD | Admitting: Physical Therapy

## 2018-04-15 DIAGNOSIS — F411 Generalized anxiety disorder: Secondary | ICD-10-CM | POA: Diagnosis not present

## 2018-04-15 DIAGNOSIS — F900 Attention-deficit hyperactivity disorder, predominantly inattentive type: Secondary | ICD-10-CM | POA: Diagnosis not present

## 2018-07-15 DIAGNOSIS — F900 Attention-deficit hyperactivity disorder, predominantly inattentive type: Secondary | ICD-10-CM | POA: Diagnosis not present

## 2018-07-15 DIAGNOSIS — F411 Generalized anxiety disorder: Secondary | ICD-10-CM | POA: Diagnosis not present

## 2018-10-14 DIAGNOSIS — F900 Attention-deficit hyperactivity disorder, predominantly inattentive type: Secondary | ICD-10-CM | POA: Diagnosis not present

## 2019-01-16 DIAGNOSIS — F900 Attention-deficit hyperactivity disorder, predominantly inattentive type: Secondary | ICD-10-CM | POA: Diagnosis not present

## 2019-04-12 DIAGNOSIS — Z20828 Contact with and (suspected) exposure to other viral communicable diseases: Secondary | ICD-10-CM | POA: Diagnosis not present

## 2019-05-03 DIAGNOSIS — F900 Attention-deficit hyperactivity disorder, predominantly inattentive type: Secondary | ICD-10-CM | POA: Diagnosis not present

## 2019-06-12 DIAGNOSIS — N5089 Other specified disorders of the male genital organs: Secondary | ICD-10-CM | POA: Diagnosis not present

## 2019-06-12 DIAGNOSIS — N5082 Scrotal pain: Secondary | ICD-10-CM | POA: Diagnosis not present

## 2019-08-01 DIAGNOSIS — F900 Attention-deficit hyperactivity disorder, predominantly inattentive type: Secondary | ICD-10-CM | POA: Diagnosis not present

## 2019-08-15 DIAGNOSIS — L280 Lichen simplex chronicus: Secondary | ICD-10-CM | POA: Diagnosis not present

## 2019-11-16 ENCOUNTER — Other Ambulatory Visit: Payer: Self-pay

## 2019-11-16 ENCOUNTER — Emergency Department (HOSPITAL_COMMUNITY)
Admission: EM | Admit: 2019-11-16 | Discharge: 2019-11-17 | Disposition: A | Discharge status: Home / Self Care | Payer: BC Managed Care – PPO | Source: Home / Self Care

## 2019-11-16 ENCOUNTER — Encounter (HOSPITAL_COMMUNITY): Payer: Self-pay | Admitting: Emergency Medicine

## 2019-11-16 DIAGNOSIS — Z8051 Family history of malignant neoplasm of kidney: Secondary | ICD-10-CM | POA: Diagnosis not present

## 2019-11-16 DIAGNOSIS — J984 Other disorders of lung: Secondary | ICD-10-CM | POA: Diagnosis not present

## 2019-11-16 DIAGNOSIS — I313 Pericardial effusion (noninflammatory): Secondary | ICD-10-CM | POA: Diagnosis not present

## 2019-11-16 DIAGNOSIS — Z5321 Procedure and treatment not carried out due to patient leaving prior to being seen by health care provider: Secondary | ICD-10-CM | POA: Insufficient documentation

## 2019-11-16 DIAGNOSIS — F988 Other specified behavioral and emotional disorders with onset usually occurring in childhood and adolescence: Secondary | ICD-10-CM | POA: Diagnosis not present

## 2019-11-16 DIAGNOSIS — R590 Localized enlarged lymph nodes: Secondary | ICD-10-CM | POA: Diagnosis not present

## 2019-11-16 DIAGNOSIS — R918 Other nonspecific abnormal finding of lung field: Secondary | ICD-10-CM | POA: Diagnosis not present

## 2019-11-16 DIAGNOSIS — R739 Hyperglycemia, unspecified: Secondary | ICD-10-CM | POA: Diagnosis not present

## 2019-11-16 DIAGNOSIS — Z20822 Contact with and (suspected) exposure to covid-19: Secondary | ICD-10-CM | POA: Diagnosis not present

## 2019-11-16 DIAGNOSIS — R0602 Shortness of breath: Secondary | ICD-10-CM | POA: Diagnosis not present

## 2019-11-16 DIAGNOSIS — K7689 Other specified diseases of liver: Secondary | ICD-10-CM | POA: Diagnosis not present

## 2019-11-16 DIAGNOSIS — I309 Acute pericarditis, unspecified: Secondary | ICD-10-CM | POA: Diagnosis not present

## 2019-11-16 DIAGNOSIS — I300 Acute nonspecific idiopathic pericarditis: Secondary | ICD-10-CM | POA: Diagnosis not present

## 2019-11-16 DIAGNOSIS — K760 Fatty (change of) liver, not elsewhere classified: Secondary | ICD-10-CM | POA: Diagnosis not present

## 2019-11-16 DIAGNOSIS — I301 Infective pericarditis: Secondary | ICD-10-CM | POA: Diagnosis not present

## 2019-11-16 DIAGNOSIS — R079 Chest pain, unspecified: Secondary | ICD-10-CM | POA: Insufficient documentation

## 2019-11-16 MED ORDER — SODIUM CHLORIDE 0.9% FLUSH: 3.0000 mL | Freq: Once | INTRAVENOUS | Status: DC

## 2019-11-16 NOTE — ED Triage Notes (Signed)
Pt reports SOB x2 days, states my chest hurt when I breath.  No other symptoms.

## 2019-11-17 ENCOUNTER — Inpatient Hospital Stay (HOSPITAL_BASED_OUTPATIENT_CLINIC_OR_DEPARTMENT_OTHER)
Admission: EM | Admit: 2019-11-17 | Discharge: 2019-11-19 | DRG: 316 | Disposition: A | Discharge status: Home / Self Care | Payer: BC Managed Care – PPO | Attending: Family Medicine | Admitting: Family Medicine

## 2019-11-17 ENCOUNTER — Emergency Department (HOSPITAL_BASED_OUTPATIENT_CLINIC_OR_DEPARTMENT_OTHER): Payer: BC Managed Care – PPO

## 2019-11-17 ENCOUNTER — Other Ambulatory Visit: Payer: Self-pay

## 2019-11-17 ENCOUNTER — Emergency Department (HOSPITAL_COMMUNITY): Payer: BC Managed Care – PPO

## 2019-11-17 ENCOUNTER — Encounter (HOSPITAL_BASED_OUTPATIENT_CLINIC_OR_DEPARTMENT_OTHER): Payer: Self-pay | Admitting: Emergency Medicine

## 2019-11-17 DIAGNOSIS — F988 Other specified behavioral and emotional disorders with onset usually occurring in childhood and adolescence: Secondary | ICD-10-CM | POA: Diagnosis present

## 2019-11-17 DIAGNOSIS — I309 Acute pericarditis, unspecified: Principal | ICD-10-CM | POA: Diagnosis present

## 2019-11-17 DIAGNOSIS — Z8051 Family history of malignant neoplasm of kidney: Secondary | ICD-10-CM

## 2019-11-17 DIAGNOSIS — I301 Infective pericarditis: Secondary | ICD-10-CM | POA: Diagnosis not present

## 2019-11-17 DIAGNOSIS — R590 Localized enlarged lymph nodes: Secondary | ICD-10-CM | POA: Diagnosis present

## 2019-11-17 DIAGNOSIS — I313 Pericardial effusion (noninflammatory): Secondary | ICD-10-CM | POA: Diagnosis not present

## 2019-11-17 DIAGNOSIS — Z20822 Contact with and (suspected) exposure to covid-19: Secondary | ICD-10-CM | POA: Diagnosis present

## 2019-11-17 DIAGNOSIS — R079 Chest pain, unspecified: Secondary | ICD-10-CM | POA: Diagnosis present

## 2019-11-17 DIAGNOSIS — R0602 Shortness of breath: Secondary | ICD-10-CM | POA: Diagnosis present

## 2019-11-17 DIAGNOSIS — R739 Hyperglycemia, unspecified: Secondary | ICD-10-CM | POA: Diagnosis present

## 2019-11-17 DIAGNOSIS — I300 Acute nonspecific idiopathic pericarditis: Secondary | ICD-10-CM | POA: Diagnosis not present

## 2019-11-17 HISTORY — DX: Other specified behavioral and emotional disorders with onset usually occurring in childhood and adolescence: F98.8

## 2019-11-17 HISTORY — DX: Unspecified asthma, uncomplicated: J45.909

## 2019-11-17 LAB — BASIC METABOLIC PANEL
Anion gap: 10 (ref 5–15)
Anion gap: 11 (ref 5–15)
BUN: 16 mg/dL (ref 6–20)
BUN: 14 mg/dL (ref 6–20)
CO2: 24 mmol/L (ref 22–32)
CO2: 27 mmol/L (ref 22–32)
Calcium: 9.8 mg/dL (ref 8.9–10.3)
Calcium: 9.3 mg/dL (ref 8.9–10.3)
Chloride: 103 mmol/L (ref 98–111)
Chloride: 100 mmol/L (ref 98–111)
Creatinine, Ser: 0.84 mg/dL (ref 0.61–1.24)
Creatinine, Ser: 0.94 mg/dL (ref 0.61–1.24)
GFR calc Af Amer: >60 mL/min (ref >60)
GFR calc Af Amer: >60 mL/min (ref >60)
GFR calc non Af Amer: >60 mL/min (ref >60)
GFR calc non Af Amer: >60 mL/min (ref >60)
Glucose, Bld: 123 mg/dL — ABNORMAL HIGH (ref 70–99)
Glucose, Bld: 122 mg/dL — ABNORMAL HIGH (ref 70–99)
Potassium: 3.6 mmol/L (ref 3.5–5.1)
Potassium: 3.8 mmol/L (ref 3.5–5.1)
Sodium: 137 mmol/L (ref 135–145)
Sodium: 138 mmol/L (ref 135–145)

## 2019-11-17 LAB — CBC
HCT: 43.3 % (ref 39.0–52.0)
Hemoglobin: 14.9 g/dL (ref 13.0–17.0)
MCH: 29.3 pg (ref 26.0–34.0)
MCHC: 34.4 g/dL (ref 30.0–36.0)
MCV: 85.1 fL (ref 80.0–100.0)
Platelets: 271 10*3/uL (ref 150–400)
RBC: 5.09 MIL/uL (ref 4.22–5.81)
RDW: 12.2 % (ref 11.5–15.5)
WBC: 6.6 10*3/uL (ref 4.0–10.5)
nRBC: 0 % (ref 0.0–0.2)

## 2019-11-17 LAB — CBC WITH DIFFERENTIAL/PLATELET
Abs Immature Granulocytes: 0.04 10*3/uL (ref 0.00–0.07)
Basophils Absolute: 0 10*3/uL (ref 0.0–0.1)
Basophils Relative: 0 %
Eosinophils Absolute: 0 10*3/uL (ref 0.0–0.5)
Eosinophils Relative: 0 %
HCT: 45.3 % (ref 39.0–52.0)
Hemoglobin: 15.3 g/dL (ref 13.0–17.0)
Immature Granulocytes: 0 %
Lymphocytes Relative: 18 %
Lymphs Abs: 1.8 10*3/uL (ref 0.7–4.0)
MCH: 28.7 pg (ref 26.0–34.0)
MCHC: 33.8 g/dL (ref 30.0–36.0)
MCV: 85 fL (ref 80.0–100.0)
Monocytes Absolute: 0.8 10*3/uL (ref 0.1–1.0)
Monocytes Relative: 8 %
Neutro Abs: 7.5 10*3/uL (ref 1.7–7.7)
Neutrophils Relative %: 74 %
Platelets: 265 10*3/uL (ref 150–400)
RBC: 5.33 MIL/uL (ref 4.22–5.81)
RDW: 12.3 % (ref 11.5–15.5)
WBC: 10.2 10*3/uL (ref 4.0–10.5)
nRBC: 0 % (ref 0.0–0.2)

## 2019-11-17 LAB — SARS CORONAVIRUS 2 BY RT PCR (HOSPITAL ORDER, PERFORMED IN ~~LOC~~ HOSPITAL LAB): SARS Coronavirus 2: NEGATIVE

## 2019-11-17 LAB — TROPONIN I (HIGH SENSITIVITY)
Troponin I (High Sensitivity): 3 ng/L (ref <18)
Troponin I (High Sensitivity): <2 ng/L (ref <18)

## 2019-11-17 LAB — C-REACTIVE PROTEIN: CRP: 3.4 mg/dL — ABNORMAL HIGH (ref <1.0)

## 2019-11-17 LAB — RAPID URINE DRUG SCREEN, HOSP PERFORMED
Amphetamines: NOT DETECTED
Barbiturates: NOT DETECTED
Benzodiazepines: NOT DETECTED
Cocaine: NOT DETECTED
Opiates: NOT DETECTED
Tetrahydrocannabinol: POSITIVE — AB

## 2019-11-17 LAB — GROUP A STREP BY PCR: Group A Strep by PCR: NOT DETECTED

## 2019-11-17 LAB — SEDIMENTATION RATE: Sed Rate: 18 mm/hr — ABNORMAL HIGH (ref 0–16)

## 2019-11-17 LAB — D-DIMER, QUANTITATIVE: D-Dimer, Quant: 0.67 ug/mL-FEU — ABNORMAL HIGH (ref 0.00–0.50)

## 2019-11-17 MED ORDER — ALBUTEROL SULFATE HFA 108 (90 BASE) MCG/ACT IN AERS
4.0000 | INHALATION_SPRAY | Freq: Once | RESPIRATORY_TRACT | Status: AC
  Administered 2019-11-17: 4 via RESPIRATORY_TRACT
  Filled 2019-11-17: qty 6.7

## 2019-11-17 MED ORDER — COLCHICINE 0.6 MG PO TABS
0.6000 mg | ORAL_TABLET | Freq: Once | ORAL | Status: AC
  Administered 2019-11-17: 0.6 mg via ORAL
  Filled 2019-11-17: qty 1

## 2019-11-17 MED ORDER — ONDANSETRON HCL 4 MG/2ML IJ SOLN: 4.0000 mg | Freq: Four times a day (QID) | INTRAMUSCULAR | Status: DC | PRN

## 2019-11-17 MED ORDER — HYDROMORPHONE HCL 1 MG/ML IJ SOLN
0.5000 mg | Freq: Once | INTRAMUSCULAR | Status: AC
  Administered 2019-11-17: 0.5 mg via INTRAVENOUS
  Filled 2019-11-17: qty 1

## 2019-11-17 MED ORDER — FENTANYL CITRATE (PF) 100 MCG/2ML IJ SOLN
50.0000 ug | Freq: Once | INTRAMUSCULAR | Status: AC
  Administered 2019-11-17: 50 ug via INTRAVENOUS
  Filled 2019-11-17: qty 2

## 2019-11-17 MED ORDER — ASPIRIN 81 MG PO CHEW
324.0000 mg | CHEWABLE_TABLET | Freq: Once | ORAL | Status: AC
  Administered 2019-11-17: 324 mg via ORAL
  Filled 2019-11-17: qty 4

## 2019-11-17 MED ORDER — ONDANSETRON HCL 4 MG PO TABS: 4.0000 mg | ORAL_TABLET | Freq: Four times a day (QID) | ORAL | Status: DC | PRN

## 2019-11-17 MED ORDER — SODIUM CHLORIDE 0.9 % IV SOLN: INTRAVENOUS | Status: AC

## 2019-11-17 MED ORDER — METHYLPREDNISOLONE SODIUM SUCC 125 MG IJ SOLR
125.0000 mg | Freq: Once | INTRAMUSCULAR | Status: AC
  Administered 2019-11-17: 125 mg via INTRAVENOUS
  Filled 2019-11-17: qty 2

## 2019-11-17 MED ORDER — COLCHICINE 0.6 MG PO TABS
0.6000 mg | ORAL_TABLET | Freq: Two times a day (BID) | ORAL | Status: DC
  Administered 2019-11-18 – 2019-11-19 (×4): 0.6 mg via ORAL
  Filled 2019-11-17 (×4): qty 1

## 2019-11-17 MED ORDER — AEROCHAMBER PLUS FLO-VU MISC
1.0000 | Freq: Once | Status: AC
  Administered 2019-11-17: 1
  Filled 2019-11-17: qty 1

## 2019-11-17 MED ORDER — IBUPROFEN 600 MG PO TABS
600.0000 mg | ORAL_TABLET | Freq: Three times a day (TID) | ORAL | Status: DC
  Administered 2019-11-18: 600 mg via ORAL
  Filled 2019-11-17 (×3): qty 1

## 2019-11-17 MED ORDER — FENTANYL CITRATE (PF) 100 MCG/2ML IJ SOLN
25.0000 ug | INTRAMUSCULAR | Status: DC | PRN
  Administered 2019-11-17 – 2019-11-18 (×4): 25 ug via INTRAVENOUS
  Filled 2019-11-17 (×4): qty 2

## 2019-11-17 MED ORDER — IOHEXOL 350 MG/ML SOLN
100.0000 mL | Freq: Once | INTRAVENOUS | Status: AC | PRN
  Administered 2019-11-17: 79 mL via INTRAVENOUS

## 2019-11-17 NOTE — H&P (Signed)
History and Physical    Martin Spencer SHF:[id number] DOB: March 23, 1991 DOA: 11/17/2019  PCP: Patient, No Pcp Per  Patient coming from: Home.  Chief Complaint: Chest pain.  HPI: Martin Spencer is a 29 y.o. male with history of ADD used to be on Adderall until 2 months ago when patient stopped taking it presents to the ER with complaint of any chest pain.  Patient started having chest pain last 3 days which is progressively worsened.  Pain increased on lying down at times on walking and taking deep inspiration because of the difficulty breathing.  No fever chills.  Patient had taken his second dose of COVID-19 vaccine 2 weeks ago.  About 2 months ago patient was treated for rash on the left forearm by dermatologist with steroids.  ED Course: In the ER EKG shows diffuse ST-T changes with PR depression concerning for pericarditis.  High sensitive troponin was less than 2.  CT angiogram of the chest was negative for pulmonary embolism but did show features concerning for enlarged thymus with mild mediastinal and paratracheal lymph nodes concerning for possible lymphoma.  Patient was started on colchicine and also was given 1 dose of steroid.  Admitted for further management of acute pericarditis.  CRP was 3.4 urine drug screen was positive for marijuana Covid test was negative.  Review of Systems: As per HPI, rest all negative.   History reviewed. No pertinent past medical history.  Past Surgical History:  Procedure Laterality Date  . APPENDECTOMY    . LAPAROSCOPIC APPENDECTOMY N/A 11/14/2017   Procedure: APPENDECTOMY LAPAROSCOPIC;  Surgeon: Georganna Skeans, MD;  Location: [place];  Service: General;  Laterality: N/A;     reports that he has never smoked. He has never used smokeless tobacco. He reports current alcohol use. He reports previous drug use.  Allergies  Allergen Reactions  . Other Other (See Comments)    Cats-sneezing,red itchy eyes    Family History  Problem Relation Age  of Onset  . Kidney cancer Father     Prior to Admission medications   Not on File    Physical Exam: Constitutional: Moderately built and nourished. Vitals:   11/17/19 1800 11/17/19 1845 11/17/19 1900 11/17/19 2055  BP:  115/78  (!) 130/67  Pulse:  88  86  Resp:  18  18  Temp: 99 F (37.2 C)  99 F (37.2 C) 99.1 F (37.3 C)  TempSrc:    Oral  SpO2:  98%  98%  Weight:    84.1 kg  Height:       Eyes: Anicteric no pallor. ENMT: No discharge from the ears eyes nose or mouth. Neck: No mass felt.  No neck rigidity. Respiratory: No rhonchi or crepitations. Cardiovascular: S1-S2 heard. Abdomen: Soft nontender bowel sounds present. Musculoskeletal: No edema. Skin: Left forearm medicine rash. Neurologic: Alert awake oriented to time place and person.  Moves all extremities. Psychiatric: Appears normal per normal affect.   Labs on Admission: I have personally reviewed following labs and imaging studies  CBC: Recent Labs  Lab 11/17/19 0005 11/17/19 1409  WBC 6.6 10.2  NEUTROABS  --  7.5  HGB 14.9 15.3  HCT 43.3 45.3  MCV 85.1 85.0  PLT 271 502   Basic Metabolic Panel: Recent Labs  Lab 11/17/19 0005 11/17/19 1409  NA 137 138  K 3.6 3.8  CL 103 100  CO2 24 27  GLUCOSE 123* 122*  BUN 16 14  CREATININE 0.84 0.94  CALCIUM 9.8 9.3  GFR: Estimated Creatinine Clearance: 126.8 mL/min (by C-G formula based on SCr of 0.94 mg/dL). Liver Function Tests: No results for input(s): AST, ALT, ALKPHOS, BILITOT, PROT, ALBUMIN in the last 168 hours. No results for input(s): LIPASE, AMYLASE in the last 168 hours. No results for input(s): AMMONIA in the last 168 hours. Coagulation Profile: No results for input(s): INR, PROTIME in the last 168 hours. Cardiac Enzymes: No results for input(s): CKTOTAL, CKMB, CKMBINDEX, TROPONINI in the last 168 hours. BNP (last 3 results) No results for input(s): PROBNP in the last 8760 hours. HbA1C: No results for input(s): HGBA1C in the last  72 hours. CBG: No results for input(s): GLUCAP in the last 168 hours. Lipid Profile: No results for input(s): CHOL, HDL, LDLCALC, TRIG, CHOLHDL, LDLDIRECT in the last 72 hours. Thyroid Function Tests: No results for input(s): TSH, T4TOTAL, FREET4, T3FREE, THYROIDAB in the last 72 hours. Anemia Panel: No results for input(s): VITAMINB12, FOLATE, FERRITIN, TIBC, IRON, RETICCTPCT in the last 72 hours. Urine analysis:    Component Value Date/Time   COLORURINE YELLOW 11/14/2017 0620   APPEARANCEUR CLOUDY (A) 11/14/2017 0620   LABSPEC 1.020 11/14/2017 0620   PHURINE 7.0 11/14/2017 0620   GLUCOSEU NEGATIVE 11/14/2017 0620   HGBUR NEGATIVE 11/14/2017 0620   BILIRUBINUR NEGATIVE 11/14/2017 0620   KETONESUR NEGATIVE 11/14/2017 0620   PROTEINUR NEGATIVE 11/14/2017 0620   NITRITE NEGATIVE 11/14/2017 0620   LEUKOCYTESUR NEGATIVE 11/14/2017 0620   Sepsis Labs: @LABRCNTIP (procalcitonin:4,lacticidven:4) ) Recent Results (from the past 240 hour(s))  SARS Coronavirus 2 by RT PCR (hospital order, performed in [place] hospital lab) Nasopharyngeal     Status: None   Collection Time: 11/17/19  2:11 PM   Specimen: Nasopharyngeal  Result Value Ref Range Status   SARS Coronavirus 2 NEGATIVE NEGATIVE Final    Comment: (NOTE) SARS-CoV-2 target nucleic acids are NOT DETECTED.  The SARS-CoV-2 RNA is generally detectable in upper and lower respiratory specimens during the acute phase of infection. The lowest concentration of SARS-CoV-2 viral copies this assay can detect is 250 copies / mL. A negative result does not preclude SARS-CoV-2 infection and should not be used as the sole basis for treatment or other patient management decisions.  A negative result may occur with improper specimen collection / handling, submission of specimen other than nasopharyngeal swab, presence of viral mutation(s) within the areas targeted by this assay, and inadequate number of viral copies (<250 copies / mL). A  negative result must be combined with clinical observations, patient history, and epidemiological information.  Fact Sheet for Patients:   StrictlyIdeas.no  Fact Sheet for Healthcare Providers: BankingDealers.co.za  This test is not yet approved or  cleared by the [place] FDA and has been authorized for detection and/or diagnosis of SARS-CoV-2 by FDA under an Emergency Use Authorization (EUA).  This EUA will remain in effect (meaning this test can be used) for the duration of the COVID-19 declaration under Section 564(b)(1) of the Act, 21 U.S.C. section 360bbb-3(b)(1), unless the authorization is terminated or revoked sooner.  Performed at [facility], [address] [place], [place] [address]   Group A Strep by PCR     Status: None   Collection Time: 11/17/19  2:11 PM   Specimen: Sterile Swab  Result Value Ref Range Status   Group A Strep by PCR NOT DETECTED NOT DETECTED Final    Comment: Performed at [facility], [address], [place], [place] [address]     Radiological  Exams on Admission: DG Chest 2 View  Result Date: 11/17/2019 CLINICAL DATA:  Short of breath for 2 days, pain with inspiration EXAM: CHEST - 2 VIEW COMPARISON:  None. FINDINGS: Frontal and lateral views of the chest demonstrate an unremarkable cardiac silhouette. Increased density in the right paratracheal region likely reflects vascular shadow. No airspace disease, effusion, or pneumothorax. No acute bony abnormalities. IMPRESSION: 1. No acute intrathoracic process. 2. Increased density right paratracheal region likely reflects vascular shadow. If Electronically Signed   By: Randa Ngo M.D.   On: 11/17/2019 00:22   CT Angio Chest PE W and/or Wo Contrast  Result Date: 11/17/2019 CLINICAL DATA:  Sharp chest pain and pressure EXAM: CT ANGIOGRAPHY CHEST WITH CONTRAST TECHNIQUE: Multidetector CT imaging of the chest was performed using  the standard protocol during bolus administration of intravenous contrast. Multiplanar CT image reconstructions and MIPs were obtained to evaluate the vascular anatomy. CONTRAST:  61mL OMNIPAQUE IOHEXOL 350 MG/ML SOLN COMPARISON:  Chest x-ray 11/17/2019 FINDINGS: Cardiovascular: Satisfactory opacification of the pulmonary arteries to the segmental level. No evidence of pulmonary embolism. Nonaneurysmal aorta. No dissection is seen. Normal cardiac size. Trace pericardial effusion. Mediastinum/Nodes: Midline trachea. No thyroid mass. Diffuse enlargement of the thymus gland. Multiple enlarged mediastinal lymph nodes. Right paratracheal lymph node measuring at least 2 cm, corresponding to radiographic abnormality. Prevascular nodes measuring up to 18 mm. No hilar adenopathy. No significant axillary adenopathy. Lungs/Pleura: Lungs are clear. No pleural effusion or pneumothorax. Upper Abdomen: No acute abnormality. Musculoskeletal: No chest wall abnormality. No acute or significant osseous findings. Review of the MIP images confirms the above findings. IMPRESSION: 1. Negative for acute pulmonary embolus or aortic dissection. Clear lung fields. 2. Diffuse enlargement of the thymus gland with associated mild mediastinal adenopathy including right paratracheal enlarged lymph node corresponding to radiographic abnormality. Findings raise concern for potential lymphoproliferative abnormality such as lymphoma. Electronically Signed   By: Donavan Foil M.D.   On: 11/17/2019 16:33   DG Chest 2V REPEAT Same day  Result Date: 11/17/2019 CLINICAL DATA:  Generalized chest discomfort and throat tightness for 2 days. EXAM: CHEST - 2 VIEW SAME DAY COMPARISON:  Two-view chest x-ray 11/17/2019 12:05 a.m. at [facility]. FINDINGS: Heart size normal. Right paratracheal density at the level clavicle is stable. Focal nodular density in the left upper lobe is new. Lungs are otherwise clear. No edema or effusion is present.  Visualized soft tissues and bony thorax are unremarkable. IMPRESSION: 1. New nodular density left upper lobe raise concern for infection. 2. Right paratracheal density likely represents benign venous tissue. If clinical concern exists, CT could be used for further evaluation. Electronically Signed   By: San Morelle M.D.   On: 11/17/2019 14:44    EKG: Independently reviewed.  Diffuse ST-T changes concerning for pericarditis.  Assessment/Plan Principal Problem:   Acute pericarditis Active Problems:   Chest pain   Mediastinal lymphadenopathy    1. Acute pericarditis -patient symptoms are concerning for acute pericarditis for which patient is placed on ibuprofen colchicine.  Check 2D echo.  Follow inflammatory markers.  Consult cardiology. 2. Enlarged thymus with mediastinal and paratracheal lymphadenopathy differentials include lymphoma as per the CT scan radiology report.  Please consult oncology in the morning.  Since patient has ongoing persistent chest pain with pericarditis will need more than 2 midnight stay in inpatient status.   DVT prophylaxis: SCDs for now avoiding anticoagulation due to possible pericarditis. Code Status: Full code. Family Communication: Patient's mother. Disposition Plan: Home. Consults  called: Cardiology. Admission status: Observation.   Rise Patience MD [facility] Pager [phone].  If 7PM-7AM, please contact night-coverage [email or web address] Password [facility]  11/17/2019, 11:35 PM

## 2019-11-17 NOTE — ED Notes (Signed)
Pt leaving AMA, advised to return if symptoms worsen. 
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
## 2019-11-17 NOTE — ED Notes (Signed)
Pt had his 2nd Moderna vaccine 3 weeks ago. Pt c/o burning sensation in both lungs that is worse with every breath. Pt denies radiation of pain. Pt reports nausea and diaphoresis this AM.

## 2019-11-17 NOTE — ED Notes (Signed)
Patient transported to CT 

## 2019-11-17 NOTE — ED Triage Notes (Addendum)
Pt states he is having sob with throat tightness x 2 days.  Worsening over time, getting worse over time.  Pt is cannot sit still triage.  Pt believes its because it is hazy outside.

## 2019-11-18 ENCOUNTER — Encounter (HOSPITAL_COMMUNITY): Payer: Self-pay | Admitting: Internal Medicine

## 2019-11-18 ENCOUNTER — Inpatient Hospital Stay (HOSPITAL_COMMUNITY): Payer: BC Managed Care – PPO

## 2019-11-18 DIAGNOSIS — I300 Acute nonspecific idiopathic pericarditis: Secondary | ICD-10-CM

## 2019-11-18 DIAGNOSIS — I313 Pericardial effusion (noninflammatory): Secondary | ICD-10-CM

## 2019-11-18 DIAGNOSIS — I309 Acute pericarditis, unspecified: Secondary | ICD-10-CM | POA: Diagnosis not present

## 2019-11-18 DIAGNOSIS — R590 Localized enlarged lymph nodes: Secondary | ICD-10-CM

## 2019-11-18 DIAGNOSIS — I301 Infective pericarditis: Secondary | ICD-10-CM

## 2019-11-18 LAB — HEPATIC FUNCTION PANEL
ALT: 20 U/L (ref 0–44)
AST: 15 U/L (ref 15–41)
Albumin: 3.7 g/dL (ref 3.5–5.0)
Alkaline Phosphatase: 53 U/L (ref 38–126)
Bilirubin, Direct: 0.2 mg/dL (ref 0.0–0.2)
Indirect Bilirubin: 1 mg/dL — ABNORMAL HIGH (ref 0.3–0.9)
Total Bilirubin: 1.2 mg/dL (ref 0.3–1.2)
Total Protein: 6.9 g/dL (ref 6.5–8.1)

## 2019-11-18 LAB — TSH: TSH: 1.728 u[IU]/mL (ref 0.350–4.500)

## 2019-11-18 LAB — BASIC METABOLIC PANEL
Anion gap: 9 (ref 5–15)
BUN: 10 mg/dL (ref 6–20)
CO2: 23 mmol/L (ref 22–32)
Calcium: 9.4 mg/dL (ref 8.9–10.3)
Chloride: 105 mmol/L (ref 98–111)
Creatinine, Ser: 0.8 mg/dL (ref 0.61–1.24)
GFR calc Af Amer: >60 mL/min (ref >60)
GFR calc non Af Amer: >60 mL/min (ref >60)
Glucose, Bld: 213 mg/dL — ABNORMAL HIGH (ref 70–99)
Potassium: 3.9 mmol/L (ref 3.5–5.1)
Sodium: 137 mmol/L (ref 135–145)

## 2019-11-18 LAB — CBC
HCT: 40.3 % (ref 39.0–52.0)
Hemoglobin: 14 g/dL (ref 13.0–17.0)
MCH: 29.2 pg (ref 26.0–34.0)
MCHC: 34.7 g/dL (ref 30.0–36.0)
MCV: 84 fL (ref 80.0–100.0)
Platelets: 269 10*3/uL (ref 150–400)
RBC: 4.8 MIL/uL (ref 4.22–5.81)
RDW: 12.2 % (ref 11.5–15.5)
WBC: 12.5 10*3/uL — ABNORMAL HIGH (ref 4.0–10.5)
nRBC: 0 % (ref 0.0–0.2)

## 2019-11-18 LAB — ECHOCARDIOGRAM COMPLETE
Area-P 1/2: 2.6 cm2
Height: 71 in
S' Lateral: 3 cm
Weight: 2966.51 oz

## 2019-11-18 LAB — HEMOGLOBIN A1C
Hgb A1c MFr Bld: 5 % (ref 4.8–5.6)
Mean Plasma Glucose: 96.8 mg/dL

## 2019-11-18 LAB — HIV ANTIBODY (ROUTINE TESTING W REFLEX): HIV Screen 4th Generation wRfx: NONREACTIVE

## 2019-11-18 LAB — LACTATE DEHYDROGENASE: LDH: 132 U/L (ref 98–192)

## 2019-11-18 LAB — TROPONIN I (HIGH SENSITIVITY): Troponin I (High Sensitivity): <2 ng/L (ref <18)

## 2019-11-18 MED ORDER — MORPHINE SULFATE (PF) 2 MG/ML IV SOLN
2.0000 mg | INTRAVENOUS | Status: DC | PRN
  Administered 2019-11-18 – 2019-11-19 (×7): 2 mg via INTRAVENOUS
  Filled 2019-11-18 (×7): qty 1

## 2019-11-18 MED ORDER — IBUPROFEN 400 MG PO TABS
800.0000 mg | ORAL_TABLET | Freq: Three times a day (TID) | ORAL | Status: DC
  Administered 2019-11-18 – 2019-11-19 (×4): 800 mg via ORAL
  Filled 2019-11-18 (×4): qty 2

## 2019-11-18 MED ORDER — IBUPROFEN 600 MG PO TABS
600.0000 mg | ORAL_TABLET | Freq: Three times a day (TID) | ORAL | Status: DC
  Administered 2019-11-18: 600 mg via ORAL

## 2019-11-18 NOTE — Consult Note (Addendum)
Cardiology Consultation:   Patient ID: Martin Spencer; [id number]; 10/29/1990   Admit date: 11/17/2019 Date of Consult: 11/18/2019  Primary Care Provider: Patient, No Pcp Per Primary Cardiologist: No primary care provider on file. New Primary Electrophysiologist:  None   Patient Profile:   Martin Spencer is a 29 y.o. male with a hx of ADD (stopped Adderall), who is being seen today for the evaluation of chest pain, pericarditis at the request of Dr Kakrakandy.  History of Present Illness:   [name] was admitted 07/23 with chest pain, ECG c/w pericarditis, cards asked to see. Of note, 2nd dose COVID vaccine given 3 weeks ago.  [name] started having chest pain 3 days prior to admission. It comes in waves, can get to a 10/10. Burning pain. Hurts more with deep inspiration. No preceding illness, cough/cold sx. Recent COVID vaccine.   CP associated w/ SOB but no N&V, diaphoresis. No wheezing or cough.   Sx finally worsened and he came to the ER early 07/23 am. Left AMA w/out being seen.  Sx continued, he came back to the ER 07/23 and was admitted.  Since being admitted, the pain has been reduced to a 4/10 w/ rx, but has not gone away. It is coming back now, up to 8/10. Ibuprofen is tid.   Past Medical History:  Diagnosis Date  . ADD (attention deficit disorder)   . Childhood asthma     Past Surgical History:  Procedure Laterality Date  . APPENDECTOMY    . LAPAROSCOPIC APPENDECTOMY N/A 11/14/2017   Procedure: APPENDECTOMY LAPAROSCOPIC;  Surgeon: Thompson, Burke, MD;  Location: [facility] OR;  Service: General;  Laterality: N/A;     Prior to Admission medications   None (stopped Adderall)    Inpatient Medications: Scheduled Meds: . colchicine  0.6 mg Oral BID  . ibuprofen  600 mg Oral TID   Continuous Infusions: . sodium chloride 75 mL/hr at 11/18/19 0022   PRN Meds: ondansetron **OR** ondansetron (ZOFRAN) IV  Allergies:    Allergies  Allergen Reactions  .  Other Other (See Comments)    Cats-sneezing,red itchy eyes    Social History:   Social History   Socioeconomic History  . Marital status: Single    Spouse name: Not on file  . Number of children: Not on file  . Years of education: Not on file  . Highest education level: Not on file  Occupational History  . Not on file  Tobacco Use  . Smoking status: Never Smoker  . Smokeless tobacco: Never Used  Substance and Sexual Activity  . Alcohol use: Yes  . Drug use: Not Currently  . Sexual activity: Not on file  Other Topics Concern  . Not on file  Social History Narrative  . Not on file   Social Determinants of Health   Financial Resource Strain:   . Difficulty of Paying Living Expenses:   Food Insecurity:   . Worried About Running Out of Food in the Last Year:   . Ran Out of Food in the Last Year:   Transportation Needs:   . Lack of Transportation (Medical):   . Lack of Transportation (Non-Medical):   Physical Activity:   . Days of Exercise per Week:   . Minutes of Exercise per Session:   Stress:   . Feeling of Stress :   Social Connections:   . Frequency of Communication with Friends and Family:   . Frequency of Social Gatherings with Friends and Family:   .   Attends Religious Services:   . Active Member of Clubs or Organizations:   . Attends Archivist Meetings:   Marland Kitchen Marital Status:   Intimate Partner Violence:   . Fear of Current or Ex-Partner:   . Emotionally Abused:   Marland Kitchen Physically Abused:   . Sexually Abused:     Family History:   Family History  Problem Relation Age of Onset  . Kidney cancer Father    Family Status:  Family Status  Relation Name Status  . Father  (Not Specified)    ROS:  Please see the history of present illness.  All other ROS reviewed and negative.     Physical Exam/Data:   Vitals:   11/17/19 1900 11/17/19 2055 11/18/19 0101 11/18/19 0501  BP:  (!) 130/67 (!) 115/61 (!) 109/60  Pulse:  86 71 76  Resp:  _0 Temp: 99 F (37.2 C) 99.1 F (37.3 C) 98.7 F (37.1 C) 97.7 F (36.5 C)  TempSrc:  Oral Oral Oral  SpO2:  98% 96% 98%  Weight:  84.1 kg    Height:        Intake/Output Summary (Last 24 hours) at 11/18/2019 0841 Last data filed at 11/18/2019 0600 Gross per 24 hour  Intake 900.24 ml  Output 0 ml  Net 900.24 ml    Last 3 Weights 11/17/2019 11/17/2019 11/16/2019  Weight (lbs) 185 lb 6.5 oz 185 lb 180 lb  Weight (kg) 84.1 kg 83.915 kg 81.647 kg     Body mass index is 25.86 kg/m.   General:  Well nourished, well developed, male in no acute distress HEENT: normal Lymph: no adenopathy Neck: JVD - not elevated Endocrine:  No thryomegaly Vascular: No carotid bruits; 4/4 extremity pulses 2+  Cardiac:  normal S1, S2; RRR; no murmur, no rub Lungs:  clear bilaterally, no wheezing, rhonchi or rales  Abd: soft, nontender, no hepatomegaly  Ext: no edema Musculoskeletal:  No deformities, BUE and BLE strength normal and equal Skin: warm and dry  Neuro:  CNs 2-12 intact, no focal abnormalities noted Psych:  Normal affect   EKG:  The EKG was personally reviewed and demonstrates:  SR, diffuse ST elevation approx 1 mm, PR depression noted as well Telemetry:  Telemetry was personally reviewed and demonstrates:  SR   CV studies:   ECHO: ordered  Laboratory Data:   Chemistry Recent Labs  Lab 11/17/19 0005 11/17/19 1409 11/18/19 0317  NA 137 138 137  K 3.6 3.8 3.9  CL 103 100 105  CO2 _1 GLUCOSE 123* 122* 213*  BUN _2 CREATININE 0.84 0.94 0.80  CALCIUM 9.8 9.3 9.4  GFRNONAA >60 >60 >60  GFRAA >60 >60 >60  ANIONGAP _3 Lab Results  Component Value Date   ALT 20 11/18/2019   AST 15 11/18/2019   ALKPHOS 53 11/18/2019   BILITOT 1.2 11/18/2019   Hematology Recent Labs  Lab 11/17/19 0005 11/17/19 1409 11/18/19 0317  WBC 6.6 10.2 12.5*  RBC 5.09 5.33 4.80  HGB 14.9 15.3 14.0  HCT 43.3 45.3 40.3  MCV 85.1 85.0 84.0  MCH 29.3 28.7 29.2  MCHC 34.4  33.8 34.7  RDW 12.2 12.3 12.2  PLT 271 265 269   Cardiac Enzymes High Sensitivity Troponin:   Recent Labs  Lab 11/17/19 0005 11/17/19 1409 11/18/19 0544  TROPONINIHS 3 <2 <2      BNPNo results for input(s): BNP, PROBNP in the  last 168 hours.  DDimer  Recent Labs  Lab 11/17/19 1409  DDIMER 0.67*   TSH: No results found for: TSH Lipids:No results found for: CHOL, HDL, LDLCALC, LDLDIRECT, TRIG, CHOLHDL HgbA1c:No results found for: HGBA1C Magnesium: No results found for: MG Lab Results  Component Value Date   ESRSEDRATE 18 (H) 11/17/2019    Radiology/Studies:  DG Chest 2 View  Result Date: 11/17/2019 CLINICAL DATA:  Short of breath for 2 days, pain with inspiration EXAM: CHEST - 2 VIEW COMPARISON:  None. FINDINGS: Frontal and lateral views of the chest demonstrate an unremarkable cardiac silhouette. Increased density in the right paratracheal region likely reflects vascular shadow. No airspace disease, effusion, or pneumothorax. No acute bony abnormalities. IMPRESSION: 1. No acute intrathoracic process. 2. Increased density right paratracheal region likely reflects vascular shadow. If Electronically Signed   By: Randa Ngo M.D.   On: 11/17/2019 00:22   CT Angio Chest PE W and/or Wo Contrast  Result Date: 11/17/2019 CLINICAL DATA:  Sharp chest pain and pressure EXAM: CT ANGIOGRAPHY CHEST WITH CONTRAST TECHNIQUE: Multidetector CT imaging of the chest was performed using the standard protocol during bolus administration of intravenous contrast. Multiplanar CT image reconstructions and MIPs were obtained to evaluate the vascular anatomy. CONTRAST:  81m OMNIPAQUE IOHEXOL 350 MG/ML SOLN COMPARISON:  Chest x-ray 11/17/2019 FINDINGS: Cardiovascular: Satisfactory opacification of the pulmonary arteries to the segmental level. No evidence of pulmonary embolism. Nonaneurysmal aorta. No dissection is seen. Normal cardiac size. Trace pericardial effusion. Mediastinum/Nodes: Midline trachea.  No thyroid mass. Diffuse enlargement of the thymus gland. Multiple enlarged mediastinal lymph nodes. Right paratracheal lymph node measuring at least 2 cm, corresponding to radiographic abnormality. Prevascular nodes measuring up to 18 mm. No hilar adenopathy. No significant axillary adenopathy. Lungs/Pleura: Lungs are clear. No pleural effusion or pneumothorax. Upper Abdomen: No acute abnormality. Musculoskeletal: No chest wall abnormality. No acute or significant osseous findings. Review of the MIP images confirms the above findings. IMPRESSION: 1. Negative for acute pulmonary embolus or aortic dissection. Clear lung fields. 2. Diffuse enlargement of the thymus gland with associated mild mediastinal adenopathy including right paratracheal enlarged lymph node corresponding to radiographic abnormality. Findings raise concern for potential lymphoproliferative abnormality such as lymphoma. Electronically Signed   By: KDonavan FoilM.D.   On: 11/17/2019 16:33   DG Chest 2V REPEAT Same day  Result Date: 11/17/2019 CLINICAL DATA:  Generalized chest discomfort and throat tightness for 2 days. EXAM: CHEST - 2 VIEW SAME DAY COMPARISON:  Two-view chest x-ray 11/17/2019 12:05 a.m. at [facility] FINDINGS: Heart size normal. Right paratracheal density at the level clavicle is stable. Focal nodular density in the left upper lobe is new. Lungs are otherwise clear. No edema or effusion is present. Visualized soft tissues and bony thorax are unremarkable. IMPRESSION: 1. New nodular density left upper lobe raise concern for infection. 2. Right paratracheal density likely represents benign venous tissue. If clinical concern exists, CT could be used for further evaluation. Electronically Signed   By: CSan MorelleM.D.   On: 11/17/2019 14:44    Assessment and Plan:   1. Pericarditis - sx somewhat improved w/ rx but does not have pain control - will change ibuprofen q 8 hr 800 mg per dose - continue  colchicine bid for now - ESR only 18, hopefully can get pain under control soon - however, if inadequate pain control w/ ibuprofen, may need steroids  2. abnl chest CT - incidental finding of thymus enlargement and mediastinal adenopathy -  per IM, pt and mother are interested in getting eval ASAP  Otherwise, per IM Principal Problem:   Acute pericarditis Active Problems:   Chest pain   Mediastinal lymphadenopathy     For questions or updates, please contact [facility] Please consult [email or web address] for contact info under Cardiology/STEMI.   Signed, Rhonda Barrett, PA-C  11/18/2019 8:41 AM  Patient seen and examined with Rhonda Barrett PA-C.  Agree as above, with the following exceptions and changes as noted below. 28 yo male with symptoms of pleuritic chest pain 3 weeks after second COVID vaccine, no symptoms of URI. Gen: NAD, CV: RRR, no murmurs, Lungs: clear, Abd: soft, Extrem: Warm, well perfused, no edema, Neuro/Psych: alert and oriented x 3, normal mood and affect. All available labs, radiology testing, previous records reviewed. Echo findings, natural history of pericarditis, clinical course, as well as likelihood of progression to chronic pericarditis reviewed with patient and family. Treatment options, duration of therapy and risk of recurrence discussed in detail. We also discussed that his mediastinal adenopathy is unclear as to etiology at this time and management and workup deferred to Hem/Onc.   Recommend Ibuprofen 800 mg TID for 1 month and colchicine 0.6 mg BID for 3 months. Continue both or at least colchicine if symptoms are still present after 3 months. I will follow him closely in the outpatient setting as well.   Gayatri A Acharya, MD   

## 2019-11-18 NOTE — Progress Notes (Signed)
New Admission Note:   Arrival Method: from [facility] via carelink Mental Orientation: Alert & oriented Telemetry: 5M18, CCMD notified Assessment: to be completed Skin: Intact, warm and dry IV: RAC, saline locked Pain: 8/10, will give medication Tubes: None Safety Measures: Safety Fall Prevention Plan has been discussed  Admission: to be completed 5 [place] Orientation: Patient has been orientated to the room, unit and staff.   Family: none at bedside  Orders to be reviewed and implemented. Will continue to monitor the patient. Call light has been placed within reach and bed alarm has been activated.

## 2019-11-18 NOTE — Progress Notes (Signed)
per lab the cd19 and 20 will need to be re-ordered for monday.  they are sent to an outside lab thats not open today.  MD made aware

## 2019-11-18 NOTE — Progress Notes (Signed)
  Echocardiogram 2D Echocardiogram has been performed.  Martin Spencer 11/18/2019, 10:42 AM

## 2019-11-18 NOTE — Progress Notes (Addendum)
PROGRESS NOTE    [name]  CBJ:[id number] DOB: 07/24/1990 DOA: 11/17/2019 PCP: Patient, No Pcp Per   Brief Narrative:  HPI: Martin Spencer is a 29 y.o. male with history of ADD used to be on Adderall until 2 months ago when patient stopped taking it presents to the ER with complaint of any chest pain.  Patient started having chest pain last 3 days which is progressively worsened.  Pain increased on lying down at times on walking and taking deep inspiration because of the difficulty breathing.  No fever chills.  Patient had taken his second dose of COVID-19 vaccine 2 weeks ago.  About 2 months ago patient was treated for rash on the left forearm by dermatologist with steroids.  ED Course: In the ER EKG shows diffuse ST-T changes with PR depression concerning for pericarditis.  High sensitive troponin was less than 2.  CT angiogram of the chest was negative for pulmonary embolism but did show features concerning for enlarged thymus with mild mediastinal and paratracheal lymph nodes concerning for possible lymphoma.  Patient was started on colchicine and also was given 1 dose of steroid.  Admitted for further management of acute pericarditis.  CRP was 3.4 urine drug screen was positive for marijuana Covid test was negative.  Assessment & Plan:   Principal Problem:   Acute pericarditis Active Problems:   Chest pain   Mediastinal lymphadenopathy  Acute pericarditis: Seen by cardiology.  Increase dosage of ibuprofen and colchicine.  May need prednisone if no improvement.  Currently having intermittent pleuritic chest pain but hemodynamically stable and troponins negative.  Echo pending.  Enlarged thymus with mediastinal and paratracheal lymphadenopathy: Patient and her mother at the bedside are interested in further work-up.  I have consulted oncology on-call/Dr. Lorenso Courier.  Have ordered for blood test/TSH and flow cytometry per his recommendation and waiting for him to guide further  evaluation from here.  LFTs within normal range  Hyperglycemia: Checking hemoglobin A1c.  DVT prophylaxis: SCDs Start: 11/17/19 2333   Code Status: Full Code  Family Communication: Mother and a friend present at bedside.  Plan of care discussed with patient in length and he verbalized understanding and agreed with it.  Status is: Inpatient  Remains inpatient appropriate because:Inpatient level of care appropriate due to severity of illness   Dispo: The patient is from: Home              Anticipated d/c is to: Home              Anticipated d/c date is: 1 day              Patient currently is not medically stable to d/c.        Estimated body mass index is 25.86 kg/m as calculated from the following:   Height as of this encounter: 5\' 11"  (1.803 m).   Weight as of this encounter: 84.1 kg.      Nutritional status:               Consultants:   GI and oncology  Procedures:   None  Antimicrobials:  Anti-infectives (From admission, onward)   None         Subjective: Seen and examined.  Mother and a friend at the bedside.  Patient is getting echo.  He tells me that he still has intermittent pleuritic chest pain but slightly better than yesterday.  No shortness of breath or any other complaint.  Objective: Vitals:   11/17/19  2055 11/18/19 0101 11/18/19 0501 11/18/19 0915  BP: (!) 130/67 (!) 115/61 (!) 109/60 121/68  Pulse: 86 71 76 79  Resp: 18 18 18 18   Temp: 99.1 F (37.3 C) 98.7 F (37.1 C) 97.7 F (36.5 C) 98.4 F (36.9 C)  TempSrc: Oral Oral Oral Oral  SpO2: 98% 96% 98% 98%  Weight: 84.1 kg     Height:        Intake/Output Summary (Last 24 hours) at 11/18/2019 1117 Last data filed at 11/18/2019 0900 Gross per 24 hour  Intake 1200.24 ml  Output 500 ml  Net 700.24 ml   Filed Weights   11/17/19 1106 11/17/19 2055  Weight: 83.9 kg 84.1 kg    Examination:  General exam: Appears calm and comfortable  Respiratory system: Clear to  auscultation. Respiratory effort normal. Cardiovascular system: S1 & S2 heard, RRR. No JVD, murmurs, rubs, gallops or clicks. No pedal edema. Gastrointestinal system: Abdomen is nondistended, soft and nontender. No organomegaly or masses felt. Normal bowel sounds heard. Central nervous system: Alert and oriented. No focal neurological deficits. Extremities: Symmetric 5 x 5 power. Skin: No rashes, lesions or ulcers Psychiatry: Judgement and insight appear normal. Mood & affect appropriate.    Data Reviewed: I have personally reviewed following labs and imaging studies  CBC: Recent Labs  Lab 11/17/19 0005 11/17/19 1409 11/18/19 0317  WBC 6.6 10.2 12.5*  NEUTROABS  --  7.5  --   HGB 14.9 15.3 14.0  HCT 43.3 45.3 40.3  MCV 85.1 85.0 84.0  PLT 271 265 295   Basic Metabolic Panel: Recent Labs  Lab 11/17/19 0005 11/17/19 1409 11/18/19 0317  NA 137 138 137  K 3.6 3.8 3.9  CL 103 100 105  CO2 24 27 23   GLUCOSE 123* 122* 213*  BUN 16 14 10   CREATININE 0.84 0.94 0.80  CALCIUM 9.8 9.3 9.4   GFR: Estimated Creatinine Clearance: 149 mL/min (by C-G formula based on SCr of 0.8 mg/dL). Liver Function Tests: Recent Labs  Lab 11/18/19 0317  AST 15  ALT 20  ALKPHOS 53  BILITOT 1.2  PROT 6.9  ALBUMIN 3.7   No results for input(s): LIPASE, AMYLASE in the last 168 hours. No results for input(s): AMMONIA in the last 168 hours. Coagulation Profile: No results for input(s): INR, PROTIME in the last 168 hours. Cardiac Enzymes: No results for input(s): CKTOTAL, CKMB, CKMBINDEX, TROPONINI in the last 168 hours. BNP (last 3 results) No results for input(s): PROBNP in the last 8760 hours. HbA1C: No results for input(s): HGBA1C in the last 72 hours. CBG: No results for input(s): GLUCAP in the last 168 hours. Lipid Profile: No results for input(s): CHOL, HDL, LDLCALC, TRIG, CHOLHDL, LDLDIRECT in the last 72 hours. Thyroid Function Tests: No results for input(s): TSH, T4TOTAL,  FREET4, T3FREE, THYROIDAB in the last 72 hours. Anemia Panel: No results for input(s): VITAMINB12, FOLATE, FERRITIN, TIBC, IRON, RETICCTPCT in the last 72 hours. Sepsis Labs: No results for input(s): PROCALCITON, LATICACIDVEN in the last 168 hours.  Recent Results (from the past 240 hour(s))  SARS Coronavirus 2 by RT PCR (hospital order, performed in [facility] hospital lab) Nasopharyngeal     Status: None   Collection Time: 11/17/19  2:11 PM   Specimen: Nasopharyngeal  Result Value Ref Range Status   SARS Coronavirus 2 NEGATIVE NEGATIVE Final    Comment: (NOTE) SARS-CoV-2 target nucleic acids are NOT DETECTED.  The SARS-CoV-2 RNA is generally detectable in upper and lower respiratory specimens during  the acute phase of infection. The lowest concentration of SARS-CoV-2 viral copies this assay can detect is 250 copies / mL. A negative result does not preclude SARS-CoV-2 infection and should not be used as the sole basis for treatment or other patient management decisions.  A negative result may occur with improper specimen collection / handling, submission of specimen other than nasopharyngeal swab, presence of viral mutation(s) within the areas targeted by this assay, and inadequate number of viral copies (<250 copies / mL). A negative result must be combined with clinical observations, patient history, and epidemiological information.  Fact Sheet for Patients:   StrictlyIdeas.no  Fact Sheet for Healthcare Providers: BankingDealers.co.za  This test is not yet approved or  cleared by the [place] FDA and has been authorized for detection and/or diagnosis of SARS-CoV-2 by FDA under an Emergency Use Authorization (EUA).  This EUA will remain in effect (meaning this test can be used) for the duration of the COVID-19 declaration under Section 564(b)(1) of the Act, 21 U.S.C. section 360bbb-3(b)(1), unless the authorization is  terminated or revoked sooner.  Performed at [facility], [address] [place], [place] [address]   Group A Strep by PCR     Status: None   Collection Time: 11/17/19  2:11 PM   Specimen: Sterile Swab  Result Value Ref Range Status   Group A Strep by PCR NOT DETECTED NOT DETECTED Final    Comment: Performed at [facility], [address] [place], [place] [address]      Radiology Studies: DG Chest 2 View  Result Date: 11/17/2019 CLINICAL DATA:  Short of breath for 2 days, pain with inspiration EXAM: CHEST - 2 VIEW COMPARISON:  None. FINDINGS: Frontal and lateral views of the chest demonstrate an unremarkable cardiac silhouette. Increased density in the right paratracheal region likely reflects vascular shadow. No airspace disease, effusion, or pneumothorax. No acute bony abnormalities. IMPRESSION: 1. No acute intrathoracic process. 2. Increased density right paratracheal region likely reflects vascular shadow. If Electronically Signed   By: Randa Ngo M.D.   On: 11/17/2019 00:22   CT Angio Chest PE W and/or Wo Contrast  Result Date: 11/17/2019 CLINICAL DATA:  Sharp chest pain and pressure EXAM: CT ANGIOGRAPHY CHEST WITH CONTRAST TECHNIQUE: Multidetector CT imaging of the chest was performed using the standard protocol during bolus administration of intravenous contrast. Multiplanar CT image reconstructions and MIPs were obtained to evaluate the vascular anatomy. CONTRAST:  36mL OMNIPAQUE IOHEXOL 350 MG/ML SOLN COMPARISON:  Chest x-ray 11/17/2019 FINDINGS: Cardiovascular: Satisfactory opacification of the pulmonary arteries to the segmental level. No evidence of pulmonary embolism. Nonaneurysmal aorta. No dissection is seen. Normal cardiac size. Trace pericardial effusion. Mediastinum/Nodes: Midline trachea. No thyroid mass. Diffuse enlargement of the thymus gland. Multiple enlarged mediastinal lymph nodes. Right paratracheal lymph node measuring at least 2 cm,  corresponding to radiographic abnormality. Prevascular nodes measuring up to 18 mm. No hilar adenopathy. No significant axillary adenopathy. Lungs/Pleura: Lungs are clear. No pleural effusion or pneumothorax. Upper Abdomen: No acute abnormality. Musculoskeletal: No chest wall abnormality. No acute or significant osseous findings. Review of the MIP images confirms the above findings. IMPRESSION: 1. Negative for acute pulmonary embolus or aortic dissection. Clear lung fields. 2. Diffuse enlargement of the thymus gland with associated mild mediastinal adenopathy including right paratracheal enlarged lymph node corresponding to radiographic abnormality. Findings raise concern for potential lymphoproliferative abnormality such as lymphoma. Electronically Signed   By: Madie Reno.D.  On: 11/17/2019 16:33   DG Chest 2V REPEAT Same day  Result Date: 11/17/2019 CLINICAL DATA:  Generalized chest discomfort and throat tightness for 2 days. EXAM: CHEST - 2 VIEW SAME DAY COMPARISON:  Two-view chest x-ray 11/17/2019 12:05 a.m. at [facility]. FINDINGS: Heart size normal. Right paratracheal density at the level clavicle is stable. Focal nodular density in the left upper lobe is new. Lungs are otherwise clear. No edema or effusion is present. Visualized soft tissues and bony thorax are unremarkable. IMPRESSION: 1. New nodular density left upper lobe raise concern for infection. 2. Right paratracheal density likely represents benign venous tissue. If clinical concern exists, CT could be used for further evaluation. Electronically Signed   By: San Morelle M.D.   On: 11/17/2019 14:44    Scheduled Meds: . colchicine  0.6 mg Oral BID  . ibuprofen  800 mg Oral Q8H   Continuous Infusions: . sodium chloride 75 mL/hr at 11/18/19 0022     LOS: 1 day   Time spent: 35 minutes   Darliss Cheney, MD [facility]  11/18/2019, 11:17 AM   To contact the attending provider between 7A-7P or the  covering provider during after hours 7P-7A, please log into the web site [email or web address].

## 2019-11-18 NOTE — Consult Note (Signed)
[place] [phone]   [phone]  INITIAL CONSULT NOTE  Patient Care Team: Patient, No Pcp Per as PCP - General (General Practice)  CHIEF COMPLAINTS/PURPOSE OF CONSULTATION:  "Enlarged Thymus/Lymphadenopathy "  HISTORY OF PRESENTING ILLNESS:  Martin Spencer 29 y.o. male with medical history significant for ADD who presented with 3 days of chest pain and was found to have pericarditis as well as a thymic mass and and mediastinal lymphadenopathy.  On review of the previous records Martin Spencer presented to the emergency department on 11/17/2019 with 3 days of chest pain that has been progressively worsening.  The pain was increased upon lying down and with taking a deep breath.  He had no other associated symptoms such as fevers, chills, sweats.  He did reportedly have a second dose of Covid vaccine about 2 weeks ago.  The patient had findings on his EKG concerning for pericarditis and therefore a CT angiogram of the chest was performed looking for pulmonary embolism.  The CT PE study did show an enlarged thymus as well as enlarged mediastinal and paratracheal lymph nodes concerning for possible lymphoma.  Given these findings hematology was consulted for further evaluation and management.  On exam today Martin Spencer is accompanied by his mother.  He reports that his symptoms began approximately 3 days ago with tingling in throat and then eventually had slightly worsened symptoms that finally peaked yesterday with 10-10 chest pain.  He notes that it is a sharp pain in the center of the chest which does not radiate and is worse with deep inspiration or lying flat.  He notes that today he is feeling much better after having started therapy.  On further discussion he reports that he has never had imaging of the chest previously and he has been quite healthy.  He has had weight gain over the last several weeks after quitting Adderall therapy.  He notes that he did  have some skin infections on his left arm treated with localized steroid injections.  He notes that he does not have any other symptoms such as fevers, chills, sweats, nausea, vomiting or diarrhea.  A full 10 point ROS is listed below.  MEDICAL HISTORY:  Past Medical History:  Diagnosis Date  . ADD (attention deficit disorder)   . Childhood asthma     SURGICAL HISTORY: Past Surgical History:  Procedure Laterality Date  . APPENDECTOMY    . LAPAROSCOPIC APPENDECTOMY N/A 11/14/2017   Procedure: APPENDECTOMY LAPAROSCOPIC;  Surgeon: Georganna Skeans, MD;  Location: [facility];  Service: General;  Laterality: N/A;    SOCIAL HISTORY: Social History   Socioeconomic History  . Marital status: Single    Spouse name: Not on file  . Number of children: Not on file  . Years of education: Not on file  . Highest education level: Not on file  Occupational History  . Not on file  Tobacco Use  . Smoking status: Never Smoker  . Smokeless tobacco: Never Used  Substance and Sexual Activity  . Alcohol use: Yes  . Drug use: Not Currently  . Sexual activity: Not on file  Other Topics Concern  . Not on file  Social History Narrative  . Not on file   Social Determinants of Health   Financial Resource Strain:   . Difficulty of Paying Living Expenses:   Food Insecurity:   . Worried About Charity fundraiser in the Last Year:   . [place] in the Last Year:  Transportation Needs:   . Film/video editor (Medical):   Marland Kitchen Lack of Transportation (Non-Medical):   Physical Activity:   . Days of Exercise per Week:   . Minutes of Exercise per Session:   Stress:   . Feeling of Stress :   Social Connections:   . Frequency of Communication with Friends and Family:   . Frequency of Social Gatherings with Friends and Family:   . Attends Religious Services:   . Active Member of Clubs or Organizations:   . Attends Archivist Meetings:   Marland Kitchen Marital Status:   Intimate Partner Violence:    . Fear of Current or Ex-Partner:   . Emotionally Abused:   Marland Kitchen Physically Abused:   . Sexually Abused:     FAMILY HISTORY: Family History  Problem Relation Age of Onset  . Kidney cancer Father     ALLERGIES:  is allergic to other.  MEDICATIONS:  Current Facility-Administered Medications  Medication Dose Route Frequency Provider Last Rate Last Admin  . 0.9 %  sodium chloride infusion   Intravenous Continuous Rise Patience, MD 75 mL/hr at 11/18/19 0022 New Bag at 11/18/19 0022  . colchicine tablet 0.6 mg  0.6 mg Oral BID Rise Patience, MD   0.6 mg at 11/18/19 7564  . ibuprofen (ADVIL) tablet 800 mg  800 mg Oral Q8H Barrett, Rhonda G, PA-C      . morphine 2 MG/ML injection 2 mg  2 mg Intravenous Q4H PRN Darliss Cheney, MD   2 mg at 11/18/19 1310  . ondansetron (ZOFRAN) tablet 4 mg  4 mg Oral Q6H PRN Rise Patience, MD       Or  . ondansetron [facility]) injection 4 mg  4 mg Intravenous Q6H PRN Rise Patience, MD        REVIEW OF SYSTEMS:   Constitutional: ( - ) fevers, ( - )  chills , ( - ) night sweats Eyes: ( - ) blurriness of vision, ( - ) double vision, ( - ) watery eyes Ears, nose, mouth, throat, and face: ( - ) mucositis, ( - ) sore throat Respiratory: ( - ) cough, ( - ) dyspnea, ( - ) wheezes Cardiovascular: ( - ) palpitation, ( - ) chest discomfort, ( - ) lower extremity swelling Gastrointestinal:  ( - ) nausea, ( - ) heartburn, ( - ) change in bowel habits Skin: ( - ) abnormal skin rashes Lymphatics: ( - ) new lymphadenopathy, ( - ) easy bruising Neurological: ( - ) numbness, ( - ) tingling, ( - ) new weaknesses Behavioral/Psych: ( - ) mood change, ( - ) new changes  All other systems were reviewed with the patient and are negative.  PHYSICAL EXAMINATION: ECOG PERFORMANCE STATUS: 1 - Symptomatic but completely ambulatory  Vitals:   11/18/19 0501 11/18/19 0915  BP: (!) 109/60 121/68  Pulse: 76 79  Resp: 18 18  Temp: 97.7 F (36.5 C) 98.4 F  (36.9 C)  SpO2: 98% 98%   Filed Weights   11/17/19 1106 11/17/19 2055  Weight: 185 lb (83.9 kg) 185 lb 6.5 oz (84.1 kg)    GENERAL: well appearing young Caucasian male in NAD  SKIN: skin color, texture, turgor are normal, no rashes or significant lesions EYES: conjunctiva are pink and non-injected, sclera clear NECK: supple, non-tender LYMPH:  no palpable lymphadenopathy in the cervical, axillary or supraclavicular nodes LUNGS: clear to auscultation and percussion with normal breathing effort HEART: regular rate & rhythm and  no murmurs and no lower extremity edema Musculoskeletal: no cyanosis of digits and no clubbing  PSYCH: alert & oriented x 3, fluent speech NEURO: no focal motor/sensory deficits  LABORATORY DATA:  I have reviewed the data as listed CBC Latest Ref Rng & Units 11/18/2019 11/17/2019 11/17/2019  WBC 4.0 - 10.5 K/uL 12.5(H) 10.2 6.6  Hemoglobin 13.0 - 17.0 g/dL 14.0 15.3 14.9  Hematocrit 39 - 52 % 40.3 45.3 43.3  Platelets 150 - 400 K/uL 269 265 271    CMP Latest Ref Rng & Units 11/18/2019 11/17/2019 11/17/2019  Glucose 70 - 99 mg/dL 213(H) 122(H) 123(H)  BUN 6 - 20 mg/dL 10 14 16   Creatinine 0.61 - 1.24 mg/dL 0.80 0.94 0.84  Sodium 135 - 145 mmol/L 137 138 137  Potassium 3.5 - 5.1 mmol/L 3.9 3.8 3.6  Chloride 98 - 111 mmol/L 105 100 103  CO2 22 - 32 mmol/L 23 27 24   Calcium 8.9 - 10.3 mg/dL 9.4 9.3 9.8  Total Protein 6.5 - 8.1 g/dL 6.9 - -  Total Bilirubin 0.3 - 1.2 mg/dL 1.2 - -  Alkaline Phos 38 - 126 U/L 53 - -  AST 15 - 41 U/L 15 - -  ALT 0 - 44 U/L 20 - -   RADIOGRAPHIC STUDIES: I have personally reviewed the radiological images as listed and agreed with the findings in the report: markedly enlarged thymus gland with mediastinal lymphadenopathy.   DG Chest 2 View  Result Date: 11/17/2019 CLINICAL DATA:  Short of breath for 2 days, pain with inspiration EXAM: CHEST - 2 VIEW COMPARISON:  None. FINDINGS: Frontal and lateral views of the chest demonstrate  an unremarkable cardiac silhouette. Increased density in the right paratracheal region likely reflects vascular shadow. No airspace disease, effusion, or pneumothorax. No acute bony abnormalities. IMPRESSION: 1. No acute intrathoracic process. 2. Increased density right paratracheal region likely reflects vascular shadow. If Electronically Signed   By: Randa Ngo M.D.   On: 11/17/2019 00:22   CT Angio Chest PE W and/or Wo Contrast  Result Date: 11/17/2019 CLINICAL DATA:  Sharp chest pain and pressure EXAM: CT ANGIOGRAPHY CHEST WITH CONTRAST TECHNIQUE: Multidetector CT imaging of the chest was performed using the standard protocol during bolus administration of intravenous contrast. Multiplanar CT image reconstructions and MIPs were obtained to evaluate the vascular anatomy. CONTRAST:  60mL OMNIPAQUE IOHEXOL 350 MG/ML SOLN COMPARISON:  Chest x-ray 11/17/2019 FINDINGS: Cardiovascular: Satisfactory opacification of the pulmonary arteries to the segmental level. No evidence of pulmonary embolism. Nonaneurysmal aorta. No dissection is seen. Normal cardiac size. Trace pericardial effusion. Mediastinum/Nodes: Midline trachea. No thyroid mass. Diffuse enlargement of the thymus gland. Multiple enlarged mediastinal lymph nodes. Right paratracheal lymph node measuring at least 2 cm, corresponding to radiographic abnormality. Prevascular nodes measuring up to 18 mm. No hilar adenopathy. No significant axillary adenopathy. Lungs/Pleura: Lungs are clear. No pleural effusion or pneumothorax. Upper Abdomen: No acute abnormality. Musculoskeletal: No chest wall abnormality. No acute or significant osseous findings. Review of the MIP images confirms the above findings. IMPRESSION: 1. Negative for acute pulmonary embolus or aortic dissection. Clear lung fields. 2. Diffuse enlargement of the thymus gland with associated mild mediastinal adenopathy including right paratracheal enlarged lymph node corresponding to radiographic  abnormality. Findings raise concern for potential lymphoproliferative abnormality such as lymphoma. Electronically Signed   By: Donavan Foil M.D.   On: 11/17/2019 16:33   DG Chest 2V REPEAT Same day  Result Date: 11/17/2019 CLINICAL DATA:  Generalized chest discomfort and  throat tightness for 2 days. EXAM: CHEST - 2 VIEW SAME DAY COMPARISON:  Two-view chest x-ray 11/17/2019 12:05 a.m. at [facility]. FINDINGS: Heart size normal. Right paratracheal density at the level clavicle is stable. Focal nodular density in the left upper lobe is new. Lungs are otherwise clear. No edema or effusion is present. Visualized soft tissues and bony thorax are unremarkable. IMPRESSION: 1. New nodular density left upper lobe raise concern for infection. 2. Right paratracheal density likely represents benign venous tissue. If clinical concern exists, CT could be used for further evaluation. Electronically Signed   By: San Morelle M.D.   On: 11/17/2019 14:44   ECHOCARDIOGRAM COMPLETE  Result Date: 11/18/2019    ECHOCARDIOGRAM REPORT   Patient Name:   RUEL DIMMICK [facility] Date of Exam: 11/18/2019 Medical Rec #:  [id number]         Height:       71.0 in Accession #:    [id number]        Weight:       185.4 lb Date of Birth:  January 29, 1991          BSA:          2.042 m Patient Age:    28 years          BP:           109/60 mmHg Patient Gender: M                 HR:           74 bpm. Exam Location:  Inpatient Procedure: 2D Echo, Cardiac Doppler and Color Doppler                               MODIFIED REPORT: This report was modified by Cherlynn Kaiser MD on 11/18/2019 due to revision.  Indications:     I31.3 Pericardial effusion  History:         Patient has no prior history of Echocardiogram examinations.  Sonographer:     Tiffany Dance Referring Phys:  [place] Diagnosing Phys: Cherlynn Kaiser MD IMPRESSIONS  1. Left ventricular ejection fraction, by estimation, is 65%. The left ventricle has normal  function. The left ventricle has no regional wall motion abnormalities. Left ventricular diastolic parameters were normal.  2. Right ventricular systolic function is normal. The right ventricular size is normal. Tricuspid regurgitation signal is inadequate for assessing PA pressure.  3. The mitral valve is normal in structure. Trivial mitral valve regurgitation. No evidence of mitral stenosis.  4. The aortic valve is tricuspid. Aortic valve regurgitation is not visualized. No aortic stenosis is present.  5. No features of constrictive physiology. Trace pericardial fluid.  6. The inferior vena cava is normal in size with greater than 50% respiratory variability, suggesting right atrial pressure of 3 mmHg. Conclusion(s)/Recommendation(s): Normal biventricular function without evidence of hemodynamically significant valvular heart disease. Normal coronary artery origins. Trace pericardial fluid. FINDINGS  Left Ventricle: Left ventricular ejection fraction, by estimation, is 65%. The left ventricle has normal function. The left ventricle has no regional wall motion abnormalities. The left ventricular internal cavity size was normal in size. There is no left ventricular hypertrophy. Left ventricular diastolic parameters were normal. Right Ventricle: The right ventricular size is normal. No increase in right ventricular wall thickness. Right ventricular systolic function is normal. Tricuspid regurgitation signal is inadequate for assessing PA pressure. Left Atrium: Left atrial size  was normal in size. Right Atrium: Right atrial size was normal in size. Pericardium: Trivial pericardial effusion is present. Mitral Valve: The mitral valve is normal in structure. Normal mobility of the mitral valve leaflets. Trivial mitral valve regurgitation. No evidence of mitral valve stenosis. Tricuspid Valve: The tricuspid valve is normal in structure. Tricuspid valve regurgitation is not demonstrated. No evidence of tricuspid stenosis.  Aortic Valve: The aortic valve is tricuspid. Aortic valve regurgitation is not visualized. No aortic stenosis is present. Pulmonic Valve: The pulmonic valve was normal in structure. Pulmonic valve regurgitation is not visualized. No evidence of pulmonic stenosis. Aorta: The aortic root is normal in size and structure. Venous: The inferior vena cava is normal in size with greater than 50% respiratory variability, suggesting right atrial pressure of 3 mmHg. IAS/Shunts: No atrial level shunt detected by color flow Doppler.  LEFT VENTRICLE PLAX 2D LVIDd:         4.80 cm  Diastology LVIDs:         3.00 cm  LV e' lateral:   15.30 cm/s LV PW:         0.90 cm  LV E/e' lateral: 6.6 LV IVS:        0.80 cm  LV e' medial:    12.90 cm/s LVOT diam:     2.30 cm  LV E/e' medial:  7.8 LV SV:         108 LV SV Index:   53 LVOT Area:     4.15 cm  RIGHT VENTRICLE             IVC RV Basal diam:  2.70 cm     IVC diam: 2.00 cm RV S prime:     16.10 cm/s TAPSE (M-mode): 3.3 cm LEFT ATRIUM             Index       RIGHT ATRIUM           Index LA diam:        3.60 cm 1.76 cm/m  RA Area:     19.80 cm LA Vol (A2C):   77.7 ml 38.06 ml/m RA Volume:   62.00 ml  30.37 ml/m LA Vol (A4C):   53.0 ml 25.96 ml/m LA Biplane Vol: 69.1 ml 33.84 ml/m  AORTIC VALVE LVOT Vmax:   139.00 cm/s LVOT Vmean:  89.200 cm/s LVOT VTI:    0.259 m  AORTA Ao Root diam: 3.40 cm Ao Asc diam:  2.60 cm MITRAL VALVE MV Area (PHT): 2.60 cm     SHUNTS MV Decel Time: 292 msec     Systemic VTI:  0.26 m MV E velocity: 101.00 cm/s  Systemic Diam: 2.30 cm MV A velocity: 47.10 cm/s MV E/A ratio:  2.14 Cherlynn Kaiser MD Electronically signed by Cherlynn Kaiser MD Signature Date/Time: 11/18/2019/12:57:50 PM    Final (Updated)     ASSESSMENT & PLAN Alona Bene 29 y.o. male with medical history significant for ADD who presented with 3 days of chest pain and was found to have pericarditis as well as a thymic mass and and mediastinal lymphadenopathy.  After review of the  imaging, review the labs, discussion with the patient the findings are most consistent with an enlarged thymus and mediastinal adenopathy.  At this time does not entirely clear if any of these represent malignancy.  Enlarged thymus can be a normal radiographic variant and does not require any further intervention.  The patient's lymphadenopathy however is not as well explained.  With the pericarditis there can be expected be localized inflammation, but these are generally not associated with marked lymphadenopathy.  As such I would recommend that we obtain a TSH, LDH, and flow cytometry in order to help determine if this is lymphoma.  Additionally we can obtain a CT scan of the abdomen pelvis to assure no lymphadenopathy elsewhere.    I also think would be reasonable to have the patient followed up in the outpatient setting with repeat interval imaging to assure he lymphadenopathy has resolved. If the lymph nodes persist the patient should be evaluated by pulmonary service for consideration of EBUS sampling of that enlarged paratracheal lymph node.  Of note biopsy of thymomas is not recommended as this could potentially cause tumor seeding, therefore I recommend that we biopsy the lymph node alone.  This patient's oncological work-up does not need to be performed in-house and can be performed in the outpatient setting and therefore his discharge for his pericarditis should not be delayed for this work-up.  # Enlarged Thymus/Lymphadenopathy --please obtain TSH, LDH, and flow cytometry --please complete the staging scans with a CT abdomen/pelvis to assure no lymphadenopathy elsewhere.  --please order Hepatitis B surface antigen and Hepatitis C antibody  --no clear indication for a biopsy of one of these lesions at this time, however we will re-evaluate this pending the results from the above studies.  --Oncology will continue to follow while this patient is in house. His oncological workup can be performed in  the outpatient setting, so this should not delay his d/c    #Pericarditis --defer to primary team for further evaluation and management of the patient's pericarditis.   All questions were answered. The patient knows to call the clinic with any problems, questions or concerns.  A total of more than 55 minutes were spent on this encounter and over half of that time was spent on counseling and coordination of care as outlined above.   Ledell Peoples, MD Department of Hematology/Oncology [place] [facility] Phone: [phone] Pager: [phone] Email: [email or web address]  11/18/2019 1:18 PM   Literature Support:

## 2019-11-19 ENCOUNTER — Inpatient Hospital Stay (HOSPITAL_COMMUNITY): Payer: BC Managed Care – PPO

## 2019-11-19 LAB — CBC WITH DIFFERENTIAL/PLATELET
Abs Immature Granulocytes: 0.02 10*3/uL (ref 0.00–0.07)
Basophils Absolute: 0 10*3/uL (ref 0.0–0.1)
Basophils Relative: 1 %
Eosinophils Absolute: 0.1 10*3/uL (ref 0.0–0.5)
Eosinophils Relative: 2 %
HCT: 38.5 % — ABNORMAL LOW (ref 39.0–52.0)
Hemoglobin: 12.7 g/dL — ABNORMAL LOW (ref 13.0–17.0)
Immature Granulocytes: 0 %
Lymphocytes Relative: 38 %
Lymphs Abs: 2.7 10*3/uL (ref 0.7–4.0)
MCH: 29.1 pg (ref 26.0–34.0)
MCHC: 33 g/dL (ref 30.0–36.0)
MCV: 88.3 fL (ref 80.0–100.0)
Monocytes Absolute: 0.8 10*3/uL (ref 0.1–1.0)
Monocytes Relative: 11 %
Neutro Abs: 3.5 10*3/uL (ref 1.7–7.7)
Neutrophils Relative %: 48 %
Platelets: 215 10*3/uL (ref 150–400)
RBC: 4.36 MIL/uL (ref 4.22–5.81)
RDW: 12.6 % (ref 11.5–15.5)
WBC: 7.1 10*3/uL (ref 4.0–10.5)
nRBC: 0 % (ref 0.0–0.2)

## 2019-11-19 LAB — HEPATITIS PANEL, ACUTE
HCV Ab: NONREACTIVE
Hep A IgM: NONREACTIVE
Hep B C IgM: NONREACTIVE
Hepatitis B Surface Ag: NONREACTIVE

## 2019-11-19 MED ORDER — OMEPRAZOLE 40 MG PO CPDR: 40.0000 mg | DELAYED_RELEASE_CAPSULE | Freq: Every day | ORAL | 0 refills | Status: DC

## 2019-11-19 MED ORDER — COLCHICINE 0.6 MG PO TABS: 0.6000 mg | ORAL_TABLET | Freq: Two times a day (BID) | ORAL | 2 refills | Status: DC

## 2019-11-19 MED ORDER — IBUPROFEN 800 MG PO TABS: 800.0000 mg | ORAL_TABLET | Freq: Three times a day (TID) | ORAL | 0 refills | Status: AC

## 2019-11-19 MED ORDER — IOHEXOL 300 MG/ML  SOLN
100.0000 mL | Freq: Once | INTRAMUSCULAR | Status: AC | PRN
  Administered 2019-11-19: 100 mL via INTRAVENOUS

## 2019-11-19 NOTE — Progress Notes (Signed)
D/C summary given to pt.  Pt verbalized understanding to all d/c instructions.  Tele D/C CCMD notified, IV site D/C with no issues.  Pt stable in no distress.

## 2019-11-19 NOTE — Discharge Summary (Signed)
Physician Discharge Summary  Martin Spencer JKK:[id number] DOB: 12-20-1990 DOA: 11/17/2019  PCP: Patient, No Pcp Per  Admit date: 11/17/2019 Discharge date: 11/19/2019  Admitted From: Home Disposition: Home  Recommendations for Outpatient Follow-up:  1. Follow up with PCP in 1-2 weeks 2. Follow-up with oncology/Dr. Lorenso Courier in 1 week 3. Please obtain BMP/CBC in one week 4. Please follow up with your PCP on the following pending results: Unresulted Labs (From admission, onward) Comment          Start     Ordered   11/19/19 0500  Beta hCG quant (ref lab)  Once,   R        11/18/19 1740   11/19/19 0500  AFP tumor marker  Once,   R        11/18/19 1740   11/18/19 1328  Lymphocyte subsets,flow cytometry (InPt)  Once,   R       Question Answer Comment  Test for these components: r/o lymphoma   Release to patient Immediate      11/18/19 [place]: None Equipment/Devices: None  Discharge Condition: Stable CODE STATUS: Full code Diet recommendation: Regular  Subjective: Seen and examined this morning.  Mother and a friend at the bedside.  Feeling much better.  Chest pain controlled.  No other complaint.  Brief/Interim Summary: Martin Maneri Sheridanis a 28 y.o.malewithhistory of ADD used to be on Adderall until 2 months ago when patient stopped taking it presented to the ER with complaint of any chest pain for last 3 days which is progressively worsened. Pain increased on lying down at times on walking and taking deep inspiration because of the difficulty breathing. No fever chills. Patient had taken his second dose of COVID-19 vaccine 2 weeks ago. About 2 months ago patient was treated for rash on the left forearm by dermatologist with steroids.  ED Course:In the ER EKG shows diffuse ST-T changes with PR depression concerning for pericarditis. High sensitive troponin was less than 2. CT angiogram of the chest was negative for pulmonary embolism but did show  features concerning for enlarged thymus with mild mediastinal and paratracheal lymph nodes concerning for possible lymphoma. Patient was started on colchicine and also was given 1 dose of steroid. Admitted for further management of acute pericarditis. CRP was 3.4 urine drug screen was positive for marijuana Covid test was negative.  He was admitted under hospitalist service.  Cardiology consulted.  He was started on scheduled Advil and colchicine.  Due to enlarged thymus and paratracheal lymphadenopathy, oncology was consulted.  Further blood work-up was done per Dr. Lorenso Courier of oncology which included TSH, LDH and acute viral hepatitis panel and all of them were negative.  He recommended CT abdomen and pelvis which was done today.  This was with and without IV and oral contrast.  It showed questionable liver steatosis versus liver mass.  This needs further nonemergent MRI with and without contrast per radiology.  After this study, I called Dr. Lorenso Courier and personally discussed this CT scan results with him.  He also agreed that patient will need nonurgent MRI as an outpatient and Dr. Lorenso Courier plans to see him in the clinic and will arrange MRI of the liver as well as PET scan.  I personally discussed those CT scan findings and my conversation with Dr. Lorenso Courier to patient himself and his father over the phone.  They were concerned and wanted the answers immediately however I reassured  them that I have talked to Dr. Lorenso Courier and those are his recommendations.  I also provided them with Dr. Libby Maw office phone number to contact them on Monday to get an appointment.  He is being discharged in stable condition on 1 month of Advil 800 mg 3 times daily and 3 months of colchicine 0.6 mg twice daily.  To prevent gastritis and gastric ulcer, I am also discharging him on omeprazole 40 mg p.o. daily.  Discharge Diagnoses:  Principal Problem:   Acute pericarditis Active Problems:   Chest pain   Mediastinal  lymphadenopathy    Discharge Instructions   Allergies as of 11/19/2019      Reactions   Other Other (See Comments)   Cats-sneezing,red itchy eyes      Medication List    TAKE these medications   colchicine 0.6 MG tablet Take 1 tablet (0.6 mg total) by mouth 2 (two) times daily.   ibuprofen 800 MG tablet Commonly known as: ADVIL Take 1 tablet (800 mg total) by mouth every 8 (eight) hours.   omeprazole 40 MG capsule Commonly known as: PRILOSEC Take 1 capsule (40 mg total) by mouth daily.       Follow-up Information    Elouise Munroe, MD Follow up in 1 week(s).   Specialties: Cardiology, Radiology Contact information: [address] [place] [address] [phone]        Orson Slick, MD Follow up in 1 week(s).   Specialty: Hematology and Oncology Contact information: [address] [place] [address] [phone]              Allergies  Allergen Reactions  . Other Other (See Comments)    Cats-sneezing,red itchy eyes    Consultations: Cardiology and oncology   Procedures/Studies: DG Chest 2 View  Result Date: 11/17/2019 CLINICAL DATA:  Short of breath for 2 days, pain with inspiration EXAM: CHEST - 2 VIEW COMPARISON:  None. FINDINGS: Frontal and lateral views of the chest demonstrate an unremarkable cardiac silhouette. Increased density in the right paratracheal region likely reflects vascular shadow. No airspace disease, effusion, or pneumothorax. No acute bony abnormalities. IMPRESSION: 1. No acute intrathoracic process. 2. Increased density right paratracheal region likely reflects vascular shadow. If Electronically Signed   By: Randa Ngo M.D.   On: 11/17/2019 00:22   CT Angio Chest PE W and/or Wo Contrast  Result Date: 11/17/2019 CLINICAL DATA:  Sharp chest pain and pressure EXAM: CT ANGIOGRAPHY CHEST WITH CONTRAST TECHNIQUE: Multidetector CT imaging of the chest was performed using the standard protocol during  bolus administration of intravenous contrast. Multiplanar CT image reconstructions and MIPs were obtained to evaluate the vascular anatomy. CONTRAST:  76mL OMNIPAQUE IOHEXOL 350 MG/ML SOLN COMPARISON:  Chest x-ray 11/17/2019 FINDINGS: Cardiovascular: Satisfactory opacification of the pulmonary arteries to the segmental level. No evidence of pulmonary embolism. Nonaneurysmal aorta. No dissection is seen. Normal cardiac size. Trace pericardial effusion. Mediastinum/Nodes: Midline trachea. No thyroid mass. Diffuse enlargement of the thymus gland. Multiple enlarged mediastinal lymph nodes. Right paratracheal lymph node measuring at least 2 cm, corresponding to radiographic abnormality. Prevascular nodes measuring up to 18 mm. No hilar adenopathy. No significant axillary adenopathy. Lungs/Pleura: Lungs are clear. No pleural effusion or pneumothorax. Upper Abdomen: No acute abnormality. Musculoskeletal: No chest wall abnormality. No acute or significant osseous findings. Review of the MIP images confirms the above findings. IMPRESSION: 1. Negative for acute pulmonary embolus or aortic dissection. Clear lung fields. 2. Diffuse enlargement of the thymus  gland with associated mild mediastinal adenopathy including right paratracheal enlarged lymph node corresponding to radiographic abnormality. Findings raise concern for potential lymphoproliferative abnormality such as lymphoma. Electronically Signed   By: Donavan Foil M.D.   On: 11/17/2019 16:33   CT ABDOMEN PELVIS W CONTRAST  Result Date: 11/19/2019 CLINICAL DATA:  Inguinal and pelvic lymphadenopathy, abnormal CT chest exam demonstrating thymic enlargement and mediastinal adenopathy EXAM: CT ABDOMEN AND PELVIS WITH CONTRAST TECHNIQUE: Multidetector CT imaging of the abdomen and pelvis was performed using the standard protocol following bolus administration of intravenous contrast. Sagittal and coronal MPR images reconstructed from axial data set. CONTRAST:  130mL  OMNIPAQUE IOHEXOL 300 MG/ML SOLN IV. Dilute oral contrast. COMPARISON:  11/14/2017 FINDINGS: Lower chest: Mild bibasilar atelectasis. Hepatobiliary: Minimal focal fatty infiltration adjacent to falciform fissure. Slightly lower attenuation within the caudate lobe than remainder of liver, new since previous exam. This is ill-defined, question steatosis versus subtle mass. No additional focal hepatic abnormalities. Gallbladder unremarkable. Pancreas: Normal appearance Spleen: Normal size and appearance Adrenals/Urinary Tract: Adrenal glands, kidneys, ureters, and bladder normal appearance Stomach/Bowel: Appendix surgically absent by history. Stomach and bowel loops normal appearance Vascular/Lymphatic: Vascular structures patent. Aorta normal caliber. No abdominal, pelvic, or inguinal adenopathy. Reproductive: Unremarkable prostate gland and seminal vesicles Other: No free air or free fluid. No hernia or inflammatory process. Musculoskeletal: Osseous structures unremarkable. IMPRESSION: Question subtle mass versus steatosis within the caudate lobe of liver; due to history of thoracic adenopathy and thymic infiltration, follow-up non emergent MR imaging of the liver with and without contrast recommended to exclude hepatic mass. No abdominal, pelvic, or inguinal adenopathy identified. No additional intra-abdominal or intrapelvic abnormalities. Electronically Signed   By: Lavonia Dana M.D.   On: 11/19/2019 14:46   DG Chest 2V REPEAT Same day  Result Date: 11/17/2019 CLINICAL DATA:  Generalized chest discomfort and throat tightness for 2 days. EXAM: CHEST - 2 VIEW SAME DAY COMPARISON:  Two-view chest x-ray 11/17/2019 12:05 a.m. at [facility]. FINDINGS: Heart size normal. Right paratracheal density at the level clavicle is stable. Focal nodular density in the left upper lobe is new. Lungs are otherwise clear. No edema or effusion is present. Visualized soft tissues and bony thorax are unremarkable. IMPRESSION:  1. New nodular density left upper lobe raise concern for infection. 2. Right paratracheal density likely represents benign venous tissue. If clinical concern exists, CT could be used for further evaluation. Electronically Signed   By: San Morelle M.D.   On: 11/17/2019 14:44   ECHOCARDIOGRAM COMPLETE  Result Date: 11/18/2019    ECHOCARDIOGRAM REPORT   Patient Name:   NEZIAH BRALEY [facility] Date of Exam: 11/18/2019 Medical Rec #:  [id number]         Height:       71.0 in Accession #:    [id number]        Weight:       185.4 lb Date of Birth:  1991/03/05          BSA:          2.042 m Patient Age:    28 years          BP:           109/60 mmHg Patient Gender: M                 HR:           74 bpm. Exam Location:  Inpatient Procedure: 2D Echo, Cardiac Doppler and Color Doppler  MODIFIED REPORT: This report was modified by Cherlynn Kaiser MD on 11/18/2019 due to revision.  Indications:     I31.3 Pericardial effusion  History:         Patient has no prior history of Echocardiogram examinations.  Sonographer:     Tiffany Dance Referring Phys:  [place] Diagnosing Phys: Cherlynn Kaiser MD IMPRESSIONS  1. Left ventricular ejection fraction, by estimation, is 65%. The left ventricle has normal function. The left ventricle has no regional wall motion abnormalities. Left ventricular diastolic parameters were normal.  2. Right ventricular systolic function is normal. The right ventricular size is normal. Tricuspid regurgitation signal is inadequate for assessing PA pressure.  3. The mitral valve is normal in structure. Trivial mitral valve regurgitation. No evidence of mitral stenosis.  4. The aortic valve is tricuspid. Aortic valve regurgitation is not visualized. No aortic stenosis is present.  5. No features of constrictive physiology. Trace pericardial fluid.  6. The inferior vena cava is normal in size with greater than 50% respiratory variability, suggesting right atrial  pressure of 3 mmHg. Conclusion(s)/Recommendation(s): Normal biventricular function without evidence of hemodynamically significant valvular heart disease. Normal coronary artery origins. Trace pericardial fluid. FINDINGS  Left Ventricle: Left ventricular ejection fraction, by estimation, is 65%. The left ventricle has normal function. The left ventricle has no regional wall motion abnormalities. The left ventricular internal cavity size was normal in size. There is no left ventricular hypertrophy. Left ventricular diastolic parameters were normal. Right Ventricle: The right ventricular size is normal. No increase in right ventricular wall thickness. Right ventricular systolic function is normal. Tricuspid regurgitation signal is inadequate for assessing PA pressure. Left Atrium: Left atrial size was normal in size. Right Atrium: Right atrial size was normal in size. Pericardium: Trivial pericardial effusion is present. Mitral Valve: The mitral valve is normal in structure. Normal mobility of the mitral valve leaflets. Trivial mitral valve regurgitation. No evidence of mitral valve stenosis. Tricuspid Valve: The tricuspid valve is normal in structure. Tricuspid valve regurgitation is not demonstrated. No evidence of tricuspid stenosis. Aortic Valve: The aortic valve is tricuspid. Aortic valve regurgitation is not visualized. No aortic stenosis is present. Pulmonic Valve: The pulmonic valve was normal in structure. Pulmonic valve regurgitation is not visualized. No evidence of pulmonic stenosis. Aorta: The aortic root is normal in size and structure. Venous: The inferior vena cava is normal in size with greater than 50% respiratory variability, suggesting right atrial pressure of 3 mmHg. IAS/Shunts: No atrial level shunt detected by color flow Doppler.  LEFT VENTRICLE PLAX 2D LVIDd:         4.80 cm  Diastology LVIDs:         3.00 cm  LV e' lateral:   15.30 cm/s LV PW:         0.90 cm  LV E/e' lateral: 6.6 LV IVS:         0.80 cm  LV e' medial:    12.90 cm/s LVOT diam:     2.30 cm  LV E/e' medial:  7.8 LV SV:         108 LV SV Index:   53 LVOT Area:     4.15 cm  RIGHT VENTRICLE             IVC RV Basal diam:  2.70 cm     IVC diam: 2.00 cm RV S prime:     16.10 cm/s TAPSE (M-mode): 3.3 cm LEFT ATRIUM  Index       RIGHT ATRIUM           Index LA diam:        3.60 cm 1.76 cm/m  RA Area:     19.80 cm LA Vol (A2C):   77.7 ml 38.06 ml/m RA Volume:   62.00 ml  30.37 ml/m LA Vol (A4C):   53.0 ml 25.96 ml/m LA Biplane Vol: 69.1 ml 33.84 ml/m  AORTIC VALVE LVOT Vmax:   139.00 cm/s LVOT Vmean:  89.200 cm/s LVOT VTI:    0.259 m  AORTA Ao Root diam: 3.40 cm Ao Asc diam:  2.60 cm MITRAL VALVE MV Area (PHT): 2.60 cm     SHUNTS MV Decel Time: 292 msec     Systemic VTI:  0.26 m MV E velocity: 101.00 cm/s  Systemic Diam: 2.30 cm MV A velocity: 47.10 cm/s MV E/A ratio:  2.14 Cherlynn Kaiser MD Electronically signed by Cherlynn Kaiser MD Signature Date/Time: 11/18/2019/12:57:50 PM    Final (Updated)       Discharge Exam: Vitals:   11/19/19 0540 11/19/19 0857  BP: (!) 105/61 (!) 111/62  Pulse: 63 61  Resp: 16 17  Temp: 98 F (36.7 C) 98 F (36.7 C)  SpO2: 98%    Vitals:   11/18/19 1642 11/18/19 2102 11/19/19 0540 11/19/19 0857  BP: (!) 111/55 (!) 114/64 (!) 105/61 (!) 111/62  Pulse: 79 70 63 61  Resp: 18 18 16 17   Temp: 98.6 F (37 C) 98.5 F (36.9 C) 98 F (36.7 C) 98 F (36.7 C)  TempSrc: Oral Oral Oral Oral  SpO2: 100% 99% 98%   Weight:  86.7 kg    Height:        General: Pt is alert, awake, not in acute distress Cardiovascular: RRR, S1/S2 +, no rubs, no gallops Respiratory: CTA bilaterally, no wheezing, no rhonchi Abdominal: Soft, NT, ND, bowel sounds + Extremities: no edema, no cyanosis    The results of significant diagnostics from this hospitalization (including imaging, microbiology, ancillary and laboratory) are listed below for reference.     Microbiology: Recent Results (from the  past 240 hour(s))  SARS Coronavirus 2 by RT PCR (hospital order, performed in [facility] hospital lab) Nasopharyngeal     Status: None   Collection Time: 11/17/19  2:11 PM   Specimen: Nasopharyngeal  Result Value Ref Range Status   SARS Coronavirus 2 NEGATIVE NEGATIVE Final    Comment: (NOTE) SARS-CoV-2 target nucleic acids are NOT DETECTED.  The SARS-CoV-2 RNA is generally detectable in upper and lower respiratory specimens during the acute phase of infection. The lowest concentration of SARS-CoV-2 viral copies this assay can detect is 250 copies / mL. A negative result does not preclude SARS-CoV-2 infection and should not be used as the sole basis for treatment or other patient management decisions.  A negative result may occur with improper specimen collection / handling, submission of specimen other than nasopharyngeal swab, presence of viral mutation(s) within the areas targeted by this assay, and inadequate number of viral copies (<250 copies / mL). A negative result must be combined with clinical observations, patient history, and epidemiological information.  Fact Sheet for Patients:   StrictlyIdeas.no  Fact Sheet for Healthcare Providers: BankingDealers.co.za  This test is not yet approved or  cleared by the [place] FDA and has been authorized for detection and/or diagnosis of SARS-CoV-2 by FDA under an Emergency Use Authorization (EUA).  This EUA will remain in effect (meaning this test  can be used) for the duration of the COVID-19 declaration under Section 564(b)(1) of the Act, 21 U.S.C. section 360bbb-3(b)(1), unless the authorization is terminated or revoked sooner.  Performed at [facility], [address] [place], [place] [address]   Group A Strep by PCR     Status: None   Collection Time: 11/17/19  2:11 PM   Specimen: Sterile Swab  Result Value Ref Range Status   Group A Strep by PCR NOT  DETECTED NOT DETECTED Final    Comment: Performed at [facility], [address] [place], [place] [address]     Labs: BNP (last 3 results) No results for input(s): BNP in the last 8760 hours. Basic Metabolic Panel: Recent Labs  Lab 11/17/19 0005 11/17/19 1409 11/18/19 0317  NA 137 138 137  K 3.6 3.8 3.9  CL 103 100 105  CO2 24 27 23   GLUCOSE 123* 122* 213*  BUN 16 14 10   CREATININE 0.84 0.94 0.80  CALCIUM 9.8 9.3 9.4   Liver Function Tests: Recent Labs  Lab 11/18/19 0317  AST 15  ALT 20  ALKPHOS 53  BILITOT 1.2  PROT 6.9  ALBUMIN 3.7   No results for input(s): LIPASE, AMYLASE in the last 168 hours. No results for input(s): AMMONIA in the last 168 hours. CBC: Recent Labs  Lab 11/17/19 0005 11/17/19 1409 11/18/19 0317 11/19/19 0501  WBC 6.6 10.2 12.5* 7.1  NEUTROABS  --  7.5  --  3.5  HGB 14.9 15.3 14.0 12.7*  HCT 43.3 45.3 40.3 38.5*  MCV 85.1 85.0 84.0 88.3  PLT 271 265 269 215   Cardiac Enzymes: No results for input(s): CKTOTAL, CKMB, CKMBINDEX, TROPONINI in the last 168 hours. BNP: Invalid input(s): POCBNP CBG: No results for input(s): GLUCAP in the last 168 hours. D-Dimer Recent Labs    11/17/19 1409  DDIMER 0.67*   Hgb A1c Recent Labs    11/18/19 1111  HGBA1C 5.0   Lipid Profile No results for input(s): CHOL, HDL, LDLCALC, TRIG, CHOLHDL, LDLDIRECT in the last 72 hours. Thyroid function studies Recent Labs    11/18/19 1111  TSH 1.728   Anemia work up No results for input(s): VITAMINB12, FOLATE, FERRITIN, TIBC, IRON, RETICCTPCT in the last 72 hours. Urinalysis    Component Value Date/Time   COLORURINE YELLOW 11/14/2017 0620   APPEARANCEUR CLOUDY (A) 11/14/2017 0620   LABSPEC 1.020 11/14/2017 0620   PHURINE 7.0 11/14/2017 0620   GLUCOSEU NEGATIVE 11/14/2017 0620   HGBUR NEGATIVE 11/14/2017 0620   BILIRUBINUR NEGATIVE 11/14/2017 0620   KETONESUR NEGATIVE 11/14/2017 0620   PROTEINUR NEGATIVE 11/14/2017 0620   NITRITE  NEGATIVE 11/14/2017 0620   LEUKOCYTESUR NEGATIVE 11/14/2017 0620   Sepsis Labs Invalid input(s): PROCALCITONIN,  WBC,  LACTICIDVEN Microbiology Recent Results (from the past 240 hour(s))  SARS Coronavirus 2 by RT PCR (hospital order, performed in [facility] hospital lab) Nasopharyngeal     Status: None   Collection Time: 11/17/19  2:11 PM   Specimen: Nasopharyngeal  Result Value Ref Range Status   SARS Coronavirus 2 NEGATIVE NEGATIVE Final    Comment: (NOTE) SARS-CoV-2 target nucleic acids are NOT DETECTED.  The SARS-CoV-2 RNA is generally detectable in upper and lower respiratory specimens during the acute phase of infection. The lowest concentration of SARS-CoV-2 viral copies this assay can detect is 250 copies / mL. A negative result does not preclude SARS-CoV-2 infection and should not be used as the sole basis for treatment  or other patient management decisions.  A negative result may occur with improper specimen collection / handling, submission of specimen other than nasopharyngeal swab, presence of viral mutation(s) within the areas targeted by this assay, and inadequate number of viral copies (<250 copies / mL). A negative result must be combined with clinical observations, patient history, and epidemiological information.  Fact Sheet for Patients:   StrictlyIdeas.no  Fact Sheet for Healthcare Providers: BankingDealers.co.za  This test is not yet approved or  cleared by the [place] FDA and has been authorized for detection and/or diagnosis of SARS-CoV-2 by FDA under an Emergency Use Authorization (EUA).  This EUA will remain in effect (meaning this test can be used) for the duration of the COVID-19 declaration under Section 564(b)(1) of the Act, 21 U.S.C. section 360bbb-3(b)(1), unless the authorization is terminated or revoked sooner.  Performed at [facility], [address] [place], [place]  [address]   Group A Strep by PCR     Status: None   Collection Time: 11/17/19  2:11 PM   Specimen: Sterile Swab  Result Value Ref Range Status   Group A Strep by PCR NOT DETECTED NOT DETECTED Final    Comment: Performed at [facility], [address] [place], [place] [address]     Time coordinating discharge: 50 minutes  SIGNED:   Darliss Cheney, MD  [facility] 11/19/2019, 3:17 PM  If 7PM-7AM, please contact night-coverage [email or web address]

## 2019-11-19 NOTE — Progress Notes (Signed)
Progress Note  Patient Name: Martin Spencer Date of Encounter: 11/19/2019  Primary Cardiologist: No primary care provider on file.   Subjective   Chest pain "pretty much gone"  Inpatient Medications    Scheduled Meds: . colchicine  0.6 mg Oral BID  . ibuprofen  800 mg Oral Q8H   Continuous Infusions:  PRN Meds: morphine injection, ondansetron **OR** ondansetron (ZOFRAN) IV   Vital Signs    Vitals:   11/18/19 1642 11/18/19 2102 11/19/19 0540 11/19/19 0857  BP: (!) 111/55 (!) 114/64 (!) 105/61 (!) 111/62  Pulse: 79 70 63 61  Resp: 18 18 16 17   Temp: 98.6 F (37 C) 98.5 F (36.9 C) 98 F (36.7 C) 98 F (36.7 C)  TempSrc: Oral Oral Oral Oral  SpO2: 100% 99% 98%   Weight:  86.7 kg    Height:        Intake/Output Summary (Last 24 hours) at 11/19/2019 1340 Last data filed at 11/19/2019 0600 Gross per 24 hour  Intake 1101.11 ml  Output 1600 ml  Net -498.89 ml   Filed Weights   11/17/19 1106 11/17/19 2055 11/18/19 2102  Weight: 83.9 kg 84.1 kg 86.7 kg    Telemetry    SR - Personally Reviewed  ECG    Diffuse ST elevations - Personally Reviewed  Physical Exam   GEN: No acute distress.   Neck: No JVD Cardiac: regular rhythm, normal rate, no murmurs, rubs, or gallops.  Respiratory: Clear to auscultation bilaterally. GI: Soft, nontender, non-distended  MS: No edema; No deformity. Neuro:  Nonfocal  Psych: Normal affect   Labs    Chemistry Recent Labs  Lab 11/17/19 0005 11/17/19 1409 11/18/19 0317  NA 137 138 137  K 3.6 3.8 3.9  CL 103 100 105  CO2 24 27 23   GLUCOSE 123* 122* 213*  BUN 16 14 10   CREATININE 0.84 0.94 0.80  CALCIUM 9.8 9.3 9.4  PROT  --   --  6.9  ALBUMIN  --   --  3.7  AST  --   --  15  ALT  --   --  20  ALKPHOS  --   --  53  BILITOT  --   --  1.2  GFRNONAA >60 >60 >60  GFRAA >60 >60 >60  ANIONGAP 10 11 9      Hematology Recent Labs  Lab 11/17/19 1409 11/18/19 0317 11/19/19 0501  WBC 10.2 12.5* 7.1  RBC 5.33  4.80 4.36  HGB 15.3 14.0 12.7*  HCT 45.3 40.3 38.5*  MCV 85.0 84.0 88.3  MCH 28.7 29.2 29.1  MCHC 33.8 34.7 33.0  RDW 12.3 12.2 12.6  PLT 265 269 215    Cardiac EnzymesNo results for input(s): TROPONINI in the last 168 hours. No results for input(s): TROPIPOC in the last 168 hours.   BNPNo results for input(s): BNP, PROBNP in the last 168 hours.   DDimer  Recent Labs  Lab 11/17/19 1409  DDIMER 0.67*     Radiology    CT Angio Chest PE W and/or Wo Contrast  Result Date: 11/17/2019 CLINICAL DATA:  Sharp chest pain and pressure EXAM: CT ANGIOGRAPHY CHEST WITH CONTRAST TECHNIQUE: Multidetector CT imaging of the chest was performed using the standard protocol during bolus administration of intravenous contrast. Multiplanar CT image reconstructions and MIPs were obtained to evaluate the vascular anatomy. CONTRAST:  67mL OMNIPAQUE IOHEXOL 350 MG/ML SOLN COMPARISON:  Chest x-ray 11/17/2019 FINDINGS: Cardiovascular: Satisfactory opacification of the pulmonary arteries to the  segmental level. No evidence of pulmonary embolism. Nonaneurysmal aorta. No dissection is seen. Normal cardiac size. Trace pericardial effusion. Mediastinum/Nodes: Midline trachea. No thyroid mass. Diffuse enlargement of the thymus gland. Multiple enlarged mediastinal lymph nodes. Right paratracheal lymph node measuring at least 2 cm, corresponding to radiographic abnormality. Prevascular nodes measuring up to 18 mm. No hilar adenopathy. No significant axillary adenopathy. Lungs/Pleura: Lungs are clear. No pleural effusion or pneumothorax. Upper Abdomen: No acute abnormality. Musculoskeletal: No chest wall abnormality. No acute or significant osseous findings. Review of the MIP images confirms the above findings. IMPRESSION: 1. Negative for acute pulmonary embolus or aortic dissection. Clear lung fields. 2. Diffuse enlargement of the thymus gland with associated mild mediastinal adenopathy including right paratracheal enlarged  lymph node corresponding to radiographic abnormality. Findings raise concern for potential lymphoproliferative abnormality such as lymphoma. Electronically Signed   By: Donavan Foil M.D.   On: 11/17/2019 16:33   DG Chest 2V REPEAT Same day  Result Date: 11/17/2019 CLINICAL DATA:  Generalized chest discomfort and throat tightness for 2 days. EXAM: CHEST - 2 VIEW SAME DAY COMPARISON:  Two-view chest x-ray 11/17/2019 12:05 a.m. at [facility]. FINDINGS: Heart size normal. Right paratracheal density at the level clavicle is stable. Focal nodular density in the left upper lobe is new. Lungs are otherwise clear. No edema or effusion is present. Visualized soft tissues and bony thorax are unremarkable. IMPRESSION: 1. New nodular density left upper lobe raise concern for infection. 2. Right paratracheal density likely represents benign venous tissue. If clinical concern exists, CT could be used for further evaluation. Electronically Signed   By: San Morelle M.D.   On: 11/17/2019 14:44   ECHOCARDIOGRAM COMPLETE  Result Date: 11/18/2019    ECHOCARDIOGRAM REPORT   Patient Name:   Martin Spencer [facility] Date of Exam: 11/18/2019 Medical Rec #:  [id number]         Height:       71.0 in Accession #:    [id number]        Weight:       185.4 lb Date of Birth:  1990-06-29          BSA:          2.042 m Patient Age:    29 years          BP:           109/60 mmHg Patient Gender: M                 HR:           74 bpm. Exam Location:  Inpatient Procedure: 2D Echo, Cardiac Doppler and Color Doppler                               MODIFIED REPORT: This report was modified by Cherlynn Kaiser MD on 11/18/2019 due to revision.  Indications:     I31.3 Pericardial effusion  History:         Patient has no prior history of Echocardiogram examinations.  Sonographer:     Tiffany Dance Referring Phys:  [place] Diagnosing Phys: Cherlynn Kaiser MD IMPRESSIONS  1. Left ventricular ejection fraction, by estimation, is  65%. The left ventricle has normal function. The left ventricle has no regional wall motion abnormalities. Left ventricular diastolic parameters were normal.  2. Right ventricular systolic function is normal. The right ventricular size is normal. Tricuspid regurgitation signal is inadequate  for assessing PA pressure.  3. The mitral valve is normal in structure. Trivial mitral valve regurgitation. No evidence of mitral stenosis.  4. The aortic valve is tricuspid. Aortic valve regurgitation is not visualized. No aortic stenosis is present.  5. No features of constrictive physiology. Trace pericardial fluid.  6. The inferior vena cava is normal in size with greater than 50% respiratory variability, suggesting right atrial pressure of 3 mmHg. Conclusion(s)/Recommendation(s): Normal biventricular function without evidence of hemodynamically significant valvular heart disease. Normal coronary artery origins. Trace pericardial fluid. FINDINGS  Left Ventricle: Left ventricular ejection fraction, by estimation, is 65%. The left ventricle has normal function. The left ventricle has no regional wall motion abnormalities. The left ventricular internal cavity size was normal in size. There is no left ventricular hypertrophy. Left ventricular diastolic parameters were normal. Right Ventricle: The right ventricular size is normal. No increase in right ventricular wall thickness. Right ventricular systolic function is normal. Tricuspid regurgitation signal is inadequate for assessing PA pressure. Left Atrium: Left atrial size was normal in size. Right Atrium: Right atrial size was normal in size. Pericardium: Trivial pericardial effusion is present. Mitral Valve: The mitral valve is normal in structure. Normal mobility of the mitral valve leaflets. Trivial mitral valve regurgitation. No evidence of mitral valve stenosis. Tricuspid Valve: The tricuspid valve is normal in structure. Tricuspid valve regurgitation is not demonstrated.  No evidence of tricuspid stenosis. Aortic Valve: The aortic valve is tricuspid. Aortic valve regurgitation is not visualized. No aortic stenosis is present. Pulmonic Valve: The pulmonic valve was normal in structure. Pulmonic valve regurgitation is not visualized. No evidence of pulmonic stenosis. Aorta: The aortic root is normal in size and structure. Venous: The inferior vena cava is normal in size with greater than 50% respiratory variability, suggesting right atrial pressure of 3 mmHg. IAS/Shunts: No atrial level shunt detected by color flow Doppler.  LEFT VENTRICLE PLAX 2D LVIDd:         4.80 cm  Diastology LVIDs:         3.00 cm  LV e' lateral:   15.30 cm/s LV PW:         0.90 cm  LV E/e' lateral: 6.6 LV IVS:        0.80 cm  LV e' medial:    12.90 cm/s LVOT diam:     2.30 cm  LV E/e' medial:  7.8 LV SV:         108 LV SV Index:   53 LVOT Area:     4.15 cm  RIGHT VENTRICLE             IVC RV Basal diam:  2.70 cm     IVC diam: 2.00 cm RV S prime:     16.10 cm/s TAPSE (M-mode): 3.3 cm LEFT ATRIUM             Index       RIGHT ATRIUM           Index LA diam:        3.60 cm 1.76 cm/m  RA Area:     19.80 cm LA Vol (A2C):   77.7 ml 38.06 ml/m RA Volume:   62.00 ml  30.37 ml/m LA Vol (A4C):   53.0 ml 25.96 ml/m LA Biplane Vol: 69.1 ml 33.84 ml/m  AORTIC VALVE LVOT Vmax:   139.00 cm/s LVOT Vmean:  89.200 cm/s LVOT VTI:    0.259 m  AORTA Ao Root diam: 3.40 cm Ao Asc diam:  2.60 cm MITRAL VALVE MV Area (PHT): 2.60 cm     SHUNTS MV Decel Time: 292 msec     Systemic VTI:  0.26 m MV E velocity: 101.00 cm/s  Systemic Diam: 2.30 cm MV A velocity: 47.10 cm/s MV E/A ratio:  2.14 Cherlynn Kaiser MD Electronically signed by Cherlynn Kaiser MD Signature Date/Time: 11/18/2019/12:57:50 PM    Final (Updated)     Cardiac Studies   echo  1. Left ventricular ejection fraction, by estimation, is 65%. The left  ventricle has normal function. The left ventricle has no regional wall  motion abnormalities. Left ventricular  diastolic parameters were normal.  2. Right ventricular systolic function is normal. The right ventricular  size is normal. Tricuspid regurgitation signal is inadequate for assessing  PA pressure.  3. The mitral valve is normal in structure. Trivial mitral valve  regurgitation. No evidence of mitral stenosis.  4. The aortic valve is tricuspid. Aortic valve regurgitation is not  visualized. No aortic stenosis is present.  5. No features of constrictive physiology. Trace pericardial fluid.  6. The inferior vena cava is normal in size with greater than 50%  respiratory variability, suggesting right atrial pressure of 3 mmHg.   Patient Profile     29 y.o. male with pericarditis and mediastinal lymphadenopathy of unclear etiology.   Assessment & Plan   Principal Problem:   Acute pericarditis Active Problems:   Chest pain   Mediastinal lymphadenopathy   Pericarditis -  Ibuprofen 800 mg TID x 1 month Colchicine 0.6 mg BID x 3 mo Follow up with me in office in 1 mo to review symptoms  Lymphadenopathy - unclear etiology at this time, workup per onc.   Echo findings, natural history of pericarditis, clinical course, as well as likelihood of progression to chronic pericarditis reviewed with patient and family. Treatment options, duration of therapy and risk of recurrence discussed in detail.    For questions or updates, please contact [place] Please consult [email or web address] for contact info under        Signed, Elouise Munroe, MD  11/19/2019, 1:40 PM

## 2019-11-19 NOTE — Discharge Instructions (Signed)
Pericarditis  Pericarditis is inflammation of the pericardium. The pericardium is a thin, double-layered, fluid-filled sac that surrounds your heart. The pericardium protects and holds your heart in your chest cavity. Inflammation of your pericardium can cause rubbing (friction) between the two layers when your heart beats. Fluid may also build up between the layers of the sac (pericardial effusion). There are three types of this condition:  Acute pericarditis. Inflammation develops suddenly and causes pericardial effusion.  Chronic pericarditis. Inflammation may develop slowly, or it may continue after acute pericarditis and last longer than 6 months.  Constrictive pericarditis (rare). The layers of the pericardium stiffen and develop scar tissue. The scar tissue thickens and sticks together. This makes it difficult for the heart to work in a normal way. In most cases, pericarditis is acute and not serious. Chronic pericarditis and constrictive pericarditis are more serious and may require treatment. What are the causes? In most cases, the cause of this condition is not known. If a cause is found, it may be:  An infection from a virus.  A heart attack (myocardial infarction).  Problems after open-heart surgery.  Chest injury.  Autoimmune disorder. The body's defense system (immune system) attacks healthy tissues. Examples include lupus or rheumatoid arthritis.  Kidney failure.  Underactive thyroid gland (hypothyroidism).  Cancer that has spread (metastasized) to the pericardium from another part of the body.  Treatment using high-energy X-rays (radiation).  Certain medicines, including some seizure medicines, blood thinners, heart medicines, and antibiotics.  Infection from a fungus or bacteria (rare). What increases the risk? The following factors may make you more likely to develop this condition:  Being male.  Being 1-9 years old.  Having had pericarditis  before.  Having had a recent upper respiratory tract infection. What are the signs or symptoms? The main symptom of this condition is chest pain. The chest pain may:  Be in the center or the left side of your chest.  Not go away with rest.  Last for many hours or days.  Worsen when you lie down and get better when you sit up and lean forward.  Worsen when you swallow.  Move to your back, neck, or shoulder. Other symptoms may include:  A chronic, dry cough.  Heart palpitations. These may feel like rapid, fluttering, or pounding heartbeats.  Dizziness or fainting.  Tiredness or fatigue.  Fever.  Rapid breathing.  Shortness of breath when lying down. How is this diagnosed? This condition is diagnosed based on medical history, physical exam, and diagnostic tests. During your physical exam, your health care provider will listen for friction while your heart beats (pericardial rub). You may also have tests, including:  Blood tests to look for signs of infection and inflammation.  Electrocardiogram (ECG). This test measures the electrical activity in your heart.  Echocardiogram. This uses sound waves to make images of your heart.  CT scan.  MRI.  Culture of pericardial fluid.  Removing a tissue sample of the pericardium to look at under a microscope (biopsy). If tests show that you may have constrictive pericarditis, a small, thin tube may be inserted into your heart to confirm the diagnosis (cardiac catheterization). How is this treated? Treatment for this condition depends on the cause and type of pericarditis that you have. In most cases, acute pericarditis will clear up on its own. Treatment may include:  Limiting physical activity.  Medicines, such as: ? NSAIDs to relieve pain and inflammation. ? Steroids to reduce inflammation. ? Colchicine to relieve pain  and inflammation.  A procedure to remove fluid using a needle (pericardiocentesis) if fluid buildup puts  pressure on the heart.  Surgery to remove part of the pericardium if constrictive pericarditis develops. If another condition is causing your pericarditis, you may also need treatment for that condition. Follow these instructions at home:  Limit your activity as told by your health care provider until your symptoms improve. This usually includes: ? Resting or sitting for most of the day. ? No long walks. ? No exercise. ? No sports.  Athletes may need to limit competition for several months.  Do not use any products that contain nicotine or tobacco, such as cigarettes and e-cigarettes. If you need help quitting, ask your health care provider.  Eat a heart-healthy diet. Ask your dietitian what foods are healthy for your heart.  Take over-the-counter and prescription medicines only as told by your health care provider. ? If you were prescribed an antibiotic medicine, take it as told by your health care provider. Do not stop taking the antibiotic even if you start to feel better.  Keep all follow-up visits as told by your health care provider. This is important. Contact a health care provider if:  You continue to have symptoms of pericarditis.  You develop new symptoms of pericarditis.  Your symptoms get worse.  You have a fever. Get help right away if:  You have worsening chest pain.  You have difficulty breathing.  You pass out. These symptoms may represent a serious problem that is an emergency. Do not wait to see if the symptoms will go away. Get medical help right away. Call your local emergency services (911 in the [place] Do not drive yourself to the hospital. Summary  Pericarditis is inflammation of the pericardium.  The pericardium is a thin, double-layered, fluid-filled sac that surrounds your heart.  The main symptom of this condition is chest pain.  Treatment for this condition depends on the cause and type of pericarditis that you have. This information is not  intended to replace advice given to you by your health care provider. Make sure you discuss any questions you have with your health care provider. Document Revised: 05/20/2017 Document Reviewed: 05/20/2017 Elsevier Patient Education  2020 [employer].

## 2019-11-20 LAB — BETA HCG QUANT (REF LAB): hCG Quant: <1 m[IU]/mL (ref 0–3)

## 2019-11-20 LAB — AFP TUMOR MARKER: AFP, Serum, Tumor Marker: 2.9 ng/mL (ref 0.0–8.3)

## 2019-11-20 LAB — SURGICAL PATHOLOGY

## 2019-11-20 LAB — FLOW CYTOMETRY REQUEST - FLUID (INPATIENT)

## 2019-11-21 ENCOUNTER — Telehealth: Payer: Self-pay | Admitting: Hematology and Oncology

## 2019-11-21 NOTE — Telephone Encounter (Signed)
A hospital follow up appt has been scheduled for [name] to see Dr. Lorenso Courier on 7/30 at 8am. Pt aware to arrive 15 minutes early.

## 2019-11-22 ENCOUNTER — Telehealth: Payer: Self-pay | Admitting: Hematology and Oncology

## 2019-11-22 ENCOUNTER — Other Ambulatory Visit: Payer: Self-pay | Admitting: Hematology and Oncology

## 2019-11-22 ENCOUNTER — Telehealth: Payer: Self-pay | Admitting: *Deleted

## 2019-11-22 DIAGNOSIS — R590 Localized enlarged lymph nodes: Secondary | ICD-10-CM

## 2019-11-22 NOTE — Telephone Encounter (Signed)
Rescheduled per 7/28 los. Pt is aware of appt time and date.

## 2019-11-22 NOTE — Telephone Encounter (Signed)
TCT patient to reschedule his 11/24/19  8am appt as he needs to have PET scan done prior to seeing Dr. Lorenso Courier. Pt is requesting PET scan this week as his insurance is changing on 11/26/19 and he does not want any delays in getting scan.  Prior auth obtained and PET scan scheduled for 11/24/19 @ 7am. Changed pt appt to 3pm that afternoon. TCT patient to advise of the above. He voiced his appreciation. Dr. Lorenso Courier is aware.

## 2019-11-24 ENCOUNTER — Encounter: Payer: Self-pay | Admitting: Hematology and Oncology

## 2019-11-24 ENCOUNTER — Ambulatory Visit (HOSPITAL_COMMUNITY)
Admission: RE | Admit: 2019-11-24 | Discharge: 2019-11-24 | Disposition: A | Discharge status: Home / Self Care | Payer: BC Managed Care – PPO | Source: Ambulatory Visit | Attending: Hematology and Oncology | Admitting: Hematology and Oncology

## 2019-11-24 ENCOUNTER — Inpatient Hospital Stay: Payer: BC Managed Care – PPO | Attending: Hematology and Oncology | Admitting: Hematology and Oncology

## 2019-11-24 ENCOUNTER — Inpatient Hospital Stay: Payer: BC Managed Care – PPO

## 2019-11-24 ENCOUNTER — Inpatient Hospital Stay: Payer: BC Managed Care – PPO | Admitting: Hematology and Oncology

## 2019-11-24 ENCOUNTER — Other Ambulatory Visit: Payer: Self-pay

## 2019-11-24 VITALS — BP 141/69 | HR 79 | Temp 98.2°F | Resp 18 | Ht 71.0 in | Wt 182.2 lb

## 2019-11-24 DIAGNOSIS — J341 Cyst and mucocele of nose and nasal sinus: Secondary | ICD-10-CM | POA: Diagnosis not present

## 2019-11-24 DIAGNOSIS — R590 Localized enlarged lymph nodes: Secondary | ICD-10-CM | POA: Insufficient documentation

## 2019-11-24 DIAGNOSIS — I319 Disease of pericardium, unspecified: Secondary | ICD-10-CM | POA: Insufficient documentation

## 2019-11-24 DIAGNOSIS — J3489 Other specified disorders of nose and nasal sinuses: Secondary | ICD-10-CM | POA: Diagnosis not present

## 2019-11-24 DIAGNOSIS — R599 Enlarged lymph nodes, unspecified: Secondary | ICD-10-CM | POA: Insufficient documentation

## 2019-11-24 DIAGNOSIS — I309 Acute pericarditis, unspecified: Secondary | ICD-10-CM

## 2019-11-24 DIAGNOSIS — J9859 Other diseases of mediastinum, not elsewhere classified: Secondary | ICD-10-CM | POA: Diagnosis not present

## 2019-11-24 LAB — GLUCOSE, CAPILLARY: Glucose-Capillary: 94 mg/dL (ref 70–99)

## 2019-11-24 MED ORDER — FLUDEOXYGLUCOSE F - 18 (FDG) INJECTION
10.3000 | Freq: Once | INTRAVENOUS | Status: AC | PRN
  Administered 2019-11-24: 10.3 via INTRAVENOUS

## 2019-11-24 NOTE — Progress Notes (Signed)
[place] [phone]   [phone]  PROGRESS NOTE  Patient Care Team: Patient, No Pcp Per as PCP - General ([facility]) Martin Munroe, MD as PCP - Cardiology (Cardiology)  Hematological/Oncological History # Enlarged Mediastinal Lymph Nodes 1) 11/17/2019: presented to the emergency department with chest pain. CXR performed showed right paratracheal density. CT recommended. CT angiogram showed diffuse enlargement of the thymus gland with associated mild mediastinal adenopathy including right paratracheal enlarged lymph node corresponding to radiographic abnormality 2) 11/19/2019: CT abdomen performed showing subtle mass versus steatosis within the caudate lobe of liver; due to history of thoracic adenopathy and thymic infiltration, follow-up non emergent MR imaging of the liver with and without contrast recommended to exclude hepatic mass 3) 11/24/2019: PET CT scan performed showing hypermetabolic mediastinal adenopathy, consistent with lymphoma. ([place]) 5. No extrathoracic disease  Interval History:  Martin Spencer 29 y.o. male with medical history significant for enlarged mediastinal lymph nodes who presents for a follow up visit. The patient was last seen during his admission for pericarditis. In the interim since the last visit a PET CT scan was performed this morning.  On exam today Martin Spencer is accompanied by his mother.  He reports that his chest pain has improved considerably since he was last seen in the hospital.  He reports that he is continue to take NSAIDs and colchicine with improvement in the pericarditis pain.  He notes he has not had any issues with fevers, chills, sweats, nausea, vomiting since his last visit.  He has had no issues with shortness of breath and the only symptom he has noticed is an increase in fatigue.  A full 10 point ROS is listed below.  The bulk of today's discussion was focused on the PET CT scan  results which are detailed below.  MEDICAL HISTORY:  Past Medical History:  Diagnosis Date  . ADD (attention deficit disorder)   . Childhood asthma     SURGICAL HISTORY: Past Surgical History:  Procedure Laterality Date  . APPENDECTOMY    . LAPAROSCOPIC APPENDECTOMY N/A 11/14/2017   Procedure: APPENDECTOMY LAPAROSCOPIC;  Surgeon: Georganna Skeans, MD;  Location: [facility];  Service: General;  Laterality: N/A;    SOCIAL HISTORY: Social History   Socioeconomic History  . Marital status: Single    Spouse name: Not on file  . Number of children: Not on file  . Years of education: Not on file  . Highest education level: Not on file  Occupational History  . Not on file  Tobacco Use  . Smoking status: Never Smoker  . Smokeless tobacco: Never Used  Substance and Sexual Activity  . Alcohol use: Yes  . Drug use: Not Currently  . Sexual activity: Not on file  Other Topics Concern  . Not on file  Social History Narrative  . Not on file   Social Determinants of Health   Financial Resource Strain:   . Difficulty of Paying Living Expenses:   Food Insecurity:   . Worried About Charity fundraiser in the Last Year:   . Arboriculturist in the Last Year:   Transportation Needs:   . Film/video editor (Medical):   Marland Kitchen Lack of Transportation (Non-Medical):   Physical Activity:   . Days of Exercise per Week:   . Minutes of Exercise per Session:   Stress:   . Feeling of Stress :   Social Connections:   . Frequency of Communication with Friends and Family:   .  Frequency of Social Gatherings with Friends and Family:   . Attends Religious Services:   . Active Member of Clubs or Organizations:   . Attends Archivist Meetings:   Marland Kitchen Marital Status:   Intimate Partner Violence:   . Fear of Current or Ex-Partner:   . Emotionally Abused:   Marland Kitchen Physically Abused:   . Sexually Abused:     FAMILY HISTORY: Family History  Problem Relation Age of Onset  . Kidney cancer Father       ALLERGIES:  is allergic to other.  MEDICATIONS:  Current Outpatient Medications  Medication Sig Dispense Refill  . colchicine 0.6 MG tablet Take 1 tablet (0.6 mg total) by mouth 2 (two) times daily. 60 tablet 2  . ibuprofen (ADVIL) 800 MG tablet Take 1 tablet (800 mg total) by mouth every 8 (eight) hours. 90 tablet 0  . omeprazole (PRILOSEC) 40 MG capsule Take 1 capsule (40 mg total) by mouth daily. 30 capsule 0   No current facility-administered medications for this visit.    REVIEW OF SYSTEMS:   Constitutional: ( - ) fevers, ( - )  chills , ( - ) night sweats Eyes: ( - ) blurriness of vision, ( - ) double vision, ( - ) watery eyes Ears, nose, mouth, throat, and face: ( - ) mucositis, ( - ) sore throat Respiratory: ( - ) cough, ( - ) dyspnea, ( - ) wheezes Cardiovascular: ( - ) palpitation, ( - ) chest discomfort, ( - ) lower extremity swelling Gastrointestinal:  ( - ) nausea, ( - ) heartburn, ( - ) change in bowel habits Skin: ( - ) abnormal skin rashes Lymphatics: ( - ) new lymphadenopathy, ( - ) easy bruising Neurological: ( - ) numbness, ( - ) tingling, ( - ) new weaknesses Behavioral/Psych: ( - ) mood change, ( - ) new changes  All other systems were reviewed with the patient and are negative.  PHYSICAL EXAMINATION: ECOG PERFORMANCE STATUS: 0 - Asymptomatic  Vitals:   11/24/19 1507  BP: (!) 141/69  Pulse: 79  Resp: 18  Temp: 98.2 F (36.8 C)  SpO2: 100%   Filed Weights   11/24/19 1507  Weight: 182 lb 3.2 oz (82.6 kg)    GENERAL: well appearing fit young Caucasian male. alert, no distress and comfortable SKIN: skin color, texture, turgor are normal, no rashes or significant lesions EYES: conjunctiva are pink and non-injected, sclera clear LUNGS: clear to auscultation and percussion with normal breathing effort HEART: regular rate & rhythm and no murmurs and no lower extremity edema Musculoskeletal: no cyanosis of digits and no clubbing  PSYCH: alert &  oriented x 3, fluent speech NEURO: no focal motor/sensory deficits  LABORATORY DATA:  I have reviewed the data as listed CBC Latest Ref Rng & Units 11/19/2019 11/18/2019 11/17/2019  WBC 4.0 - 10.5 K/uL 7.1 12.5(H) 10.2  Hemoglobin 13.0 - 17.0 g/dL 12.7(L) 14.0 15.3  Hematocrit 39 - 52 % 38.5(L) 40.3 45.3  Platelets 150 - 400 K/uL 215 269 265    CMP Latest Ref Rng & Units 11/18/2019 11/17/2019 11/17/2019  Glucose 70 - 99 mg/dL 213(H) 122(H) 123(H)  BUN 6 - 20 mg/dL 10 14 16   Creatinine 0.61 - 1.24 mg/dL 0.80 0.94 0.84  Sodium 135 - 145 mmol/L 137 138 137  Potassium 3.5 - 5.1 mmol/L 3.9 3.8 3.6  Chloride 98 - 111 mmol/L 105 100 103  CO2 22 - 32 mmol/L 23 27 24   Calcium 8.9 -  10.3 mg/dL 9.4 9.3 9.8  Total Protein 6.5 - 8.1 g/dL 6.9 - -  Total Bilirubin 0.3 - 1.2 mg/dL 1.2 - -  Alkaline Phos 38 - 126 U/L 53 - -  AST 15 - 41 U/L 15 - -  ALT 0 - 44 U/L 20 - -    RADIOGRAPHIC STUDIES: I have personally review the images and agree with the findings below: marked FDG avid mediastinal lymph nodes. Mild uptake under left arm (prior COVID vaccine). No disease noted elsewhere  DG Chest 2 View  Result Date: 11/17/2019 CLINICAL DATA:  Short of breath for 2 days, pain with inspiration EXAM: CHEST - 2 VIEW COMPARISON:  None. FINDINGS: Frontal and lateral views of the chest demonstrate an unremarkable cardiac silhouette. Increased density in the right paratracheal region likely reflects vascular shadow. No airspace disease, effusion, or pneumothorax. No acute bony abnormalities. IMPRESSION: 1. No acute intrathoracic process. 2. Increased density right paratracheal region likely reflects vascular shadow. If Electronically Signed   By: Randa Ngo M.D.   On: 11/17/2019 00:22   CT Angio Chest PE W and/or Wo Contrast  Result Date: 11/17/2019 CLINICAL DATA:  Sharp chest pain and pressure EXAM: CT ANGIOGRAPHY CHEST WITH CONTRAST TECHNIQUE: Multidetector CT imaging of the chest was performed using the  standard protocol during bolus administration of intravenous contrast. Multiplanar CT image reconstructions and MIPs were obtained to evaluate the vascular anatomy. CONTRAST:  68m OMNIPAQUE IOHEXOL 350 MG/ML SOLN COMPARISON:  Chest x-ray 11/17/2019 FINDINGS: Cardiovascular: Satisfactory opacification of the pulmonary arteries to the segmental level. No evidence of pulmonary embolism. Nonaneurysmal aorta. No dissection is seen. Normal cardiac size. Trace pericardial effusion. Mediastinum/Nodes: Midline trachea. No thyroid mass. Diffuse enlargement of the thymus gland. Multiple enlarged mediastinal lymph nodes. Right paratracheal lymph node measuring at least 2 cm, corresponding to radiographic abnormality. Prevascular nodes measuring up to 18 mm. No hilar adenopathy. No significant axillary adenopathy. Lungs/Pleura: Lungs are clear. No pleural effusion or pneumothorax. Upper Abdomen: No acute abnormality. Musculoskeletal: No chest wall abnormality. No acute or significant osseous findings. Review of the MIP images confirms the above findings. IMPRESSION: 1. Negative for acute pulmonary embolus or aortic dissection. Clear lung fields. 2. Diffuse enlargement of the thymus gland with associated mild mediastinal adenopathy including right paratracheal enlarged lymph node corresponding to radiographic abnormality. Findings raise concern for potential lymphoproliferative abnormality such as lymphoma. Electronically Signed   By: KDonavan FoilM.D.   On: 11/17/2019 16:33   CT ABDOMEN PELVIS W CONTRAST  Result Date: 11/19/2019 CLINICAL DATA:  Inguinal and pelvic lymphadenopathy, abnormal CT chest exam demonstrating thymic enlargement and mediastinal adenopathy EXAM: CT ABDOMEN AND PELVIS WITH CONTRAST TECHNIQUE: Multidetector CT imaging of the abdomen and pelvis was performed using the standard protocol following bolus administration of intravenous contrast. Sagittal and coronal MPR images reconstructed from axial data  set. CONTRAST:  1047mOMNIPAQUE IOHEXOL 300 MG/ML SOLN IV. Dilute oral contrast. COMPARISON:  11/14/2017 FINDINGS: Lower chest: Mild bibasilar atelectasis. Hepatobiliary: Minimal focal fatty infiltration adjacent to falciform fissure. Slightly lower attenuation within the caudate lobe than remainder of liver, new since previous exam. This is ill-defined, question steatosis versus subtle mass. No additional focal hepatic abnormalities. Gallbladder unremarkable. Pancreas: Normal appearance Spleen: Normal size and appearance Adrenals/Urinary Tract: Adrenal glands, kidneys, ureters, and bladder normal appearance Stomach/Bowel: Appendix surgically absent by history. Stomach and bowel loops normal appearance Vascular/Lymphatic: Vascular structures patent. Aorta normal caliber. No abdominal, pelvic, or inguinal adenopathy. Reproductive: Unremarkable prostate gland and seminal vesicles Other:  No free air or free fluid. No hernia or inflammatory process. Musculoskeletal: Osseous structures unremarkable. IMPRESSION: Question subtle mass versus steatosis within the caudate lobe of liver; due to history of thoracic adenopathy and thymic infiltration, follow-up non emergent MR imaging of the liver with and without contrast recommended to exclude hepatic mass. No abdominal, pelvic, or inguinal adenopathy identified. No additional intra-abdominal or intrapelvic abnormalities. Electronically Signed   By: Lavonia Dana M.D.   On: 11/19/2019 14:46   NM PET Image Initial (PI) Skull Base To Thigh  Result Date: 11/24/2019 CLINICAL DATA:  Initial treatment strategy for evaluate mediastinal adenopathy. COVID vaccine in left arm 10/26/2019. EXAM: NUCLEAR MEDICINE PET SKULL BASE TO THIGH TECHNIQUE: 10.3 mCi F-18 FDG was injected intravenously. Full-ring PET imaging was performed from the skull base to thigh after the radiotracer. CT data was obtained and used for attenuation correction and anatomic localization. Fasting blood glucose: 94  mg/dl COMPARISON:  11/17/2019 CTA chest.  11/19/2019 abdominopelvic CT. FINDINGS: Mediastinal blood pool activity: SUV max 1.9 Liver activity: SUV max 3.4 NECK: No areas of abnormal hypermetabolism. Incidental CT findings: No cervical adenopathy. Left maxillary sinus mucous retention cyst or polyp. CHEST: Anterior mid images mediastinal hypermetabolic adenopathy. Index right paratracheal node measures 2.4 cm and a S.U.V. max of 11.0 on 61/4. Anterior mediastinal nodal mass measures 6.1 x 3.0 cm and a S.U.V. max of 25.3 on 73/4 Small left axillary nodes with low-level hypermetabolism, including at a S.U.V. max of 3.5. Likely secondary to recent vaccine. Incidental CT findings: Deferred to recent diagnostic CT. ABDOMEN/PELVIS: scattered foci of sigmoid hypermetabolism are favored to be physiologic. No hepatic hypermetabolism, including at the site of left liver lobe abnormality on abdominal CT of 11/19/2019. This is most consistent with focal steatosis. No abdominopelvic nodal hypermetabolism. Incidental CT findings: Normal adrenal glands. No abdominopelvic adenopathy. Appendectomy. SKELETON: No abnormal marrow activity. Incidental CT findings: none IMPRESSION: Hypermetabolic mediastinal adenopathy, consistent with lymphoma. ([place]) 5. No extrathoracic disease. Electronically Signed   By: Abigail Miyamoto M.D.   On: 11/24/2019 14:09   DG Chest 2V REPEAT Same day  Result Date: 11/17/2019 CLINICAL DATA:  Generalized chest discomfort and throat tightness for 2 days. EXAM: CHEST - 2 VIEW SAME DAY COMPARISON:  Two-view chest x-ray 11/17/2019 12:05 a.m. at [facility]. FINDINGS: Heart size normal. Right paratracheal density at the level clavicle is stable. Focal nodular density in the left upper lobe is new. Lungs are otherwise clear. No edema or effusion is present. Visualized soft tissues and bony thorax are unremarkable. IMPRESSION: 1. New nodular density left upper lobe raise concern for infection. 2. Right  paratracheal density likely represents benign venous tissue. If clinical concern exists, CT could be used for further evaluation. Electronically Signed   By: San Morelle M.D.   On: 11/17/2019 14:44   ECHOCARDIOGRAM COMPLETE  Result Date: 11/18/2019    ECHOCARDIOGRAM REPORT   Patient Name:   Martin Spencer [facility] Date of Exam: 11/18/2019 Medical Rec #:  [id number]         Height:       71.0 in Accession #:    [id number]        Weight:       185.4 lb Date of Birth:  10/01/1990          BSA:          2.042 m Patient Age:    28 years          BP:  109/60 mmHg Patient Gender: M                 HR:           74 bpm. Exam Location:  Inpatient Procedure: 2D Echo, Cardiac Doppler and Color Doppler                               MODIFIED REPORT: This report was modified by Cherlynn Kaiser MD on 11/18/2019 due to revision.  Indications:     I31.3 Pericardial effusion  History:         Patient has no prior history of Echocardiogram examinations.  Sonographer:     Tiffany Dance Referring Phys:  [place] Diagnosing Phys: Cherlynn Kaiser MD IMPRESSIONS  1. Left ventricular ejection fraction, by estimation, is 65%. The left ventricle has normal function. The left ventricle has no regional wall motion abnormalities. Left ventricular diastolic parameters were normal.  2. Right ventricular systolic function is normal. The right ventricular size is normal. Tricuspid regurgitation signal is inadequate for assessing PA pressure.  3. The mitral valve is normal in structure. Trivial mitral valve regurgitation. No evidence of mitral stenosis.  4. The aortic valve is tricuspid. Aortic valve regurgitation is not visualized. No aortic stenosis is present.  5. No features of constrictive physiology. Trace pericardial fluid.  6. The inferior vena cava is normal in size with greater than 50% respiratory variability, suggesting right atrial pressure of 3 mmHg. Conclusion(s)/Recommendation(s): Normal biventricular  function without evidence of hemodynamically significant valvular heart disease. Normal coronary artery origins. Trace pericardial fluid. FINDINGS  Left Ventricle: Left ventricular ejection fraction, by estimation, is 65%. The left ventricle has normal function. The left ventricle has no regional wall motion abnormalities. The left ventricular internal cavity size was normal in size. There is no left ventricular hypertrophy. Left ventricular diastolic parameters were normal. Right Ventricle: The right ventricular size is normal. No increase in right ventricular wall thickness. Right ventricular systolic function is normal. Tricuspid regurgitation signal is inadequate for assessing PA pressure. Left Atrium: Left atrial size was normal in size. Right Atrium: Right atrial size was normal in size. Pericardium: Trivial pericardial effusion is present. Mitral Valve: The mitral valve is normal in structure. Normal mobility of the mitral valve leaflets. Trivial mitral valve regurgitation. No evidence of mitral valve stenosis. Tricuspid Valve: The tricuspid valve is normal in structure. Tricuspid valve regurgitation is not demonstrated. No evidence of tricuspid stenosis. Aortic Valve: The aortic valve is tricuspid. Aortic valve regurgitation is not visualized. No aortic stenosis is present. Pulmonic Valve: The pulmonic valve was normal in structure. Pulmonic valve regurgitation is not visualized. No evidence of pulmonic stenosis. Aorta: The aortic root is normal in size and structure. Venous: The inferior vena cava is normal in size with greater than 50% respiratory variability, suggesting right atrial pressure of 3 mmHg. IAS/Shunts: No atrial level shunt detected by color flow Doppler.  LEFT VENTRICLE PLAX 2D LVIDd:         4.80 cm  Diastology LVIDs:         3.00 cm  LV e' lateral:   15.30 cm/s LV PW:         0.90 cm  LV E/e' lateral: 6.6 LV IVS:        0.80 cm  LV e' medial:    12.90 cm/s LVOT diam:     2.30 cm  LV E/e'  medial:  7.8  LV SV:         108 LV SV Index:   53 LVOT Area:     4.15 cm  RIGHT VENTRICLE             IVC RV Basal diam:  2.70 cm     IVC diam: 2.00 cm RV S prime:     16.10 cm/s TAPSE (M-mode): 3.3 cm LEFT ATRIUM             Index       RIGHT ATRIUM           Index LA diam:        3.60 cm 1.76 cm/m  RA Area:     19.80 cm LA Vol (A2C):   77.7 ml 38.06 ml/m RA Volume:   62.00 ml  30.37 ml/m LA Vol (A4C):   53.0 ml 25.96 ml/m LA Biplane Vol: 69.1 ml 33.84 ml/m  AORTIC VALVE LVOT Vmax:   139.00 cm/s LVOT Vmean:  89.200 cm/s LVOT VTI:    0.259 m  AORTA Ao Root diam: 3.40 cm Ao Asc diam:  2.60 cm MITRAL VALVE MV Area (PHT): 2.60 cm     SHUNTS MV Decel Time: 292 msec     Systemic VTI:  0.26 m MV E velocity: 101.00 cm/s  Systemic Diam: 2.30 cm MV A velocity: 47.10 cm/s MV E/A ratio:  2.14 Cherlynn Kaiser MD Electronically signed by Cherlynn Kaiser MD Signature Date/Time: 11/18/2019/12:57:50 PM    Final (Updated)     [place] M Parish 29 y.o. male with medical history significant for enlarged mediastinal lymph nodes who presents for a follow up visit. The patient was last seen during his admission for pericarditis. In the interim since the last visit a PET CT scan was performed this morning.   Bulk of today's exam was dedicated towards discussion of the PET CT scan results and the steps moving forward.  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  There does not appear to be activity elsewhere in the body.  In order to confirm the diagnosis we will set the patient up for a biopsy of this lesion.  We are requesting pulmonary evaluation for consideration of a biopsy, however this is not accessible we would recommend IR guided biopsy.  Once the tissue is obtained we will have the patient return to clinic in order to discuss the treatment options moving forward.  # Enlarged Mediastinal Lymph Nodes --PET CT scan shows  [place] 5 lymph nodes in the mediastinum with no evidence of disease elsewhere --recommend referral to pulmonary for consideration of a biopsy of the paratracheal lymph node. If not accessible, recommend IR guided biopsy.  --Hep B, Hep C, HIV negative. ESR 18, CRP 3.4 (in setting of pericarditis) LDH 132 (wnl)  -- will have patient return to clinic once biopsy has been performed.   Orders Placed This Encounter  Procedures  . Ambulatory referral to Pulmonology    Referral Priority:   Urgent    Referral Type:   Consultation    Referral Reason:   Specialty Services Required    Requested Specialty:   Pulmonary Disease    Number of Visits Requested:   1    All questions were answered. The patient knows to call the clinic with any problems, questions or concerns.  A total of more than 30 minutes were spent on this encounter and over half of that time  was spent on counseling and coordination of care as outlined above.   Ledell Peoples, MD Department of Hematology/Oncology [address] at [facility] Phone: [phone] Pager: [phone] Email: [email or web address]  11/24/2019 5:27 PM

## 2019-11-28 ENCOUNTER — Telehealth: Payer: Self-pay | Admitting: Hematology and Oncology

## 2019-11-28 NOTE — Telephone Encounter (Signed)
Scheduled per los. Called and spoke with patient. Confirmed appts  

## 2019-11-29 ENCOUNTER — Ambulatory Visit: Payer: BC Managed Care – PPO | Admitting: Pulmonary Disease

## 2019-11-29 ENCOUNTER — Encounter: Payer: Self-pay | Admitting: Pulmonary Disease

## 2019-11-29 ENCOUNTER — Other Ambulatory Visit: Payer: Self-pay

## 2019-11-29 ENCOUNTER — Telehealth: Payer: Self-pay | Admitting: *Deleted

## 2019-11-29 ENCOUNTER — Encounter (HOSPITAL_COMMUNITY): Payer: Self-pay | Admitting: Internal Medicine

## 2019-11-29 ENCOUNTER — Telehealth: Payer: Self-pay

## 2019-11-29 VITALS — BP 122/78 | HR 64 | Temp 98.6°F | Ht 71.0 in | Wt 184.6 lb

## 2019-11-29 DIAGNOSIS — R590 Localized enlarged lymph nodes: Secondary | ICD-10-CM

## 2019-11-29 DIAGNOSIS — R948 Abnormal results of function studies of other organs and systems: Secondary | ICD-10-CM

## 2019-11-29 NOTE — Progress Notes (Signed)
Pt denies any acute cardiopulmonary issues. Pt denies being under the care of a PCP. Pt denies having a stress test and cardiac cath. Pt made aware to stop taking  Aspirin (unless otherwise advised by surgeon), vitamins, fish oil and herbal medications. Do not take any NSAIDs ie: Ibuprofen, Advil, Naproxen (Aleve), Motrin, BC and Goody Powder. Pt reminded to quarantine. Pt verbalized understanding of all pre-op instructions.

## 2019-11-29 NOTE — Telephone Encounter (Signed)
There is some availability but the Dr have restrictions on there schedules.Martin Spencer

## 2019-11-29 NOTE — Patient Instructions (Signed)
Thank you for visiting Dr. Valeta Harms at [facility]. Today we recommend the following:  Orders Placed This Encounter  Procedures  . Ambulatory referral to Pulmonology   We are going to work on scheduling this tomorrow or Friday.   Return in about 4 weeks (around 12/27/2019) for with APP or Dr. Valeta Harms.    Please do your part to reduce the spread of COVID-19.

## 2019-11-29 NOTE — Telephone Encounter (Signed)
TCT patient regarding appt planning with [facility] for mediastinal node biopsy. Pt is anxious to have this appt. Advised that right now it is scheduled on 12/15/19 but that I had requested to move that appt up sooner. Waiting to hear back from that practice. Pt voiced understanding. Advised that I would be back in touch with him when I hear more from the pulmonary office.

## 2019-11-29 NOTE — Telephone Encounter (Signed)
Called patient, per Dr. Valeta Harms patient is coming in this afternoon for consult. Appointment made. FYI Dr. Lamonte Sakai.

## 2019-11-29 NOTE — Progress Notes (Signed)
Synopsis: Referred in August 2021 for abnormal pet imaging by No ref. provider found  Subjective:   PATIENT ID: Martin Spencer GENDER: male DOB: [birth year], MRN: [id number]  Chief Complaint  Patient presents with  . Consult    Recent Pets, CT    This is a 29 year old gentleman with recent CT scan of the chest with adenopathy as well as nuclear medicine pet imaging with PET avid adenopathy concerning for lymphoma.  Patient was referred to the office to discuss tissue biopsy via endobronchial ultrasound with transbronchial needle aspirations.  Patient was recently admitted to the hospital for acute pericarditis treated with ibuprofen and colchicine.  He had follow-up evaluation by the oncology office which recommended referral to pulmonary for tissue sampling.  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  PET scan imaging revealed hypermetabolic adenopathy in the pretracheal area as well as epicardial region.  Patient has no current symptoms.  Denies fevers night sweats weight loss or chills.   Past Medical History:  Diagnosis Date  . ADD (attention deficit disorder)   . Childhood asthma      Family History  Problem Relation Age of Onset  . Kidney cancer Father   . Colon cancer Paternal Grandfather      Past Surgical History:  Procedure Laterality Date  . APPENDECTOMY    . LAPAROSCOPIC APPENDECTOMY N/A 11/14/2017   Procedure: APPENDECTOMY LAPAROSCOPIC;  Surgeon: Georganna Skeans, MD;  Location: [facility];  Service: General;  Laterality: N/A;    Social History   Socioeconomic History  . Marital status: Single    Spouse name: Not on file  . Number of children: Not on file  . Years of education: Not on file  . Highest education level: Not on file  Occupational History  . Not on file  Tobacco Use  . Smoking status: Former Research scientist (life sciences)  . Smokeless tobacco: Never Used  Vaping Use  . Vaping Use: Never used  Substance and Sexual Activity  . Alcohol  use: Yes  . Drug use: Not Currently  . Sexual activity: Not on file  Other Topics Concern  . Not on file  Social History Narrative  . Not on file   Social Determinants of Health   Financial Resource Strain:   . Difficulty of Paying Living Expenses:   Food Insecurity:   . Worried About Charity fundraiser in the Last Year:   . Arboriculturist in the Last Year:   Transportation Needs:   . Film/video editor (Medical):   Marland Kitchen Lack of Transportation (Non-Medical):   Physical Activity:   . Days of Exercise per Week:   . Minutes of Exercise per Session:   Stress:   . Feeling of Stress :   Social Connections:   . Frequency of Communication with Friends and Family:   . Frequency of Social Gatherings with Friends and Family:   . Attends Religious Services:   . Active Member of Clubs or Organizations:   . Attends Archivist Meetings:   Marland Kitchen Marital Status:   Intimate Partner Violence:   . Fear of Current or Ex-Partner:   . Emotionally Abused:   Marland Kitchen Physically Abused:   . Sexually Abused:      Allergies  Allergen Reactions  . Other Other (See Comments)    Cats-sneezing,red itchy eyes     Outpatient Medications Prior to Visit  Medication Sig Dispense Refill  . colchicine 0.6 MG  tablet Take 1 tablet (0.6 mg total) by mouth 2 (two) times daily. 60 tablet 2  . ibuprofen (ADVIL) 800 MG tablet Take 1 tablet (800 mg total) by mouth every 8 (eight) hours. 90 tablet 0  . omeprazole (PRILOSEC) 40 MG capsule Take 1 capsule (40 mg total) by mouth daily. 30 capsule 0   No facility-administered medications prior to visit.    Review of Systems  Constitutional: Negative for chills, fever, malaise/fatigue and weight loss.  HENT: Negative for hearing loss, sore throat and tinnitus.   Eyes: Negative for blurred vision and double vision.  Respiratory: Negative for cough, hemoptysis, sputum production, shortness of breath, wheezing and stridor.   Cardiovascular: Negative for chest  pain, palpitations, orthopnea, leg swelling and PND.  Gastrointestinal: Negative for abdominal pain, constipation, diarrhea, heartburn, nausea and vomiting.  Genitourinary: Negative for dysuria, hematuria and urgency.  Musculoskeletal: Negative for joint pain and myalgias.  Skin: Negative for itching and rash.  Neurological: Negative for dizziness, tingling, weakness and headaches.  Endo/Heme/Allergies: Negative for environmental allergies. Does not bruise/bleed easily.  Psychiatric/Behavioral: Negative for depression. The patient is not nervous/anxious and does not have insomnia.   All other systems reviewed and are negative.    Objective:  Physical Exam Vitals reviewed.  Constitutional:      General: He is not in acute distress.    Appearance: He is well-developed.  HENT:     Head: Normocephalic and atraumatic.  Eyes:     General: No scleral icterus.    Conjunctiva/sclera: Conjunctivae normal.     Pupils: Pupils are equal, round, and reactive to light.  Neck:     Vascular: No JVD.     Trachea: No tracheal deviation.  Cardiovascular:     Rate and Rhythm: Normal rate and regular rhythm.     Heart sounds: Normal heart sounds. No murmur heard.   Pulmonary:     Effort: Pulmonary effort is normal. No tachypnea, accessory muscle usage or respiratory distress.     Breath sounds: No stridor. No wheezing, rhonchi or rales.  Abdominal:     General: Bowel sounds are normal. There is no distension.     Palpations: Abdomen is soft.     Tenderness: There is no abdominal tenderness.  Musculoskeletal:        General: No tenderness.     Cervical back: Neck supple.  Lymphadenopathy:     Cervical: No cervical adenopathy.  Skin:    General: Skin is warm and dry.     Capillary Refill: Capillary refill takes less than 2 seconds.     Findings: No rash.  Neurological:     Mental Status: He is alert and oriented to person, place, and time.  Psychiatric:        Behavior: Behavior normal.       Vitals:   11/29/19 1548  BP: 122/78  Pulse: 64  Temp: 98.6 F (37 C)  Weight: 184 lb 9.6 oz (83.7 kg)  Height: _0  (1.803 m)     on RA BMI Readings from Last 3 Encounters:  11/29/19 25.75 kg/m  11/24/19 25.41 kg/m  11/18/19 26.66 kg/m   Wt Readings from Last 3 Encounters:  11/29/19 184 lb 9.6 oz (83.7 kg)  11/24/19 182 lb 3.2 oz (82.6 kg)  11/18/19 191 lb 2.2 oz (86.7 kg)     CBC    Component Value Date/Time   WBC 7.1 11/19/2019 0501   RBC 4.36 11/19/2019 0501   HGB 12.7 (L) 11/19/2019 0501  HCT 38.5 (L) 11/19/2019 0501   PLT 215 11/19/2019 0501   MCV 88.3 11/19/2019 0501   MCH 29.1 11/19/2019 0501   MCHC 33.0 11/19/2019 0501   RDW 12.6 11/19/2019 0501   LYMPHSABS 2.7 11/19/2019 0501   MONOABS 0.8 11/19/2019 0501   EOSABS 0.1 11/19/2019 0501   BASOSABS 0.0 11/19/2019 0501     Chest Imaging: CT chest and nuclear medicine pet imaging. Large adenopathy within the pretracheal region as well as epicardial region.  Pet image with hypermetabolic adenopathy. The patient's images have been independently reviewed by me.    Pulmonary Functions Testing Results: No flowsheet data found.  FeNO:   Pathology:   Echocardiogram:   Heart Catheterization:     Assessment & Plan:     ICD-10-CM   1. Mediastinal adenopathy  R59.0 Ambulatory referral to Pulmonology  2. Abnormal PET scan of mediastinum  R94.8     Discussion:  Young gentleman 29 year old male with abnormal CT imaging and abnormal PET scan imaging concerning for potential underlying malignancy, lymphoma.  Plan: Today in the office we discussed risk benefits and alternatives of proceeding with video bronchoscopy with endobronchial ultrasound transbronchial needle aspirations. This patient needs urgent bronchoscopy. Early tissue diagnosis will help establish treatment plans. Patient is agreeable to have this procedure done as soon as possible.  Plans for video bronchoscopy with  endobronchial ultrasound tomorrow. Preop Covid testing tomorrow.  Greater than 50% of this patient's 60-minute of visit was spent face-to-face discussing above recommendations and treatment plan as well as planning for procedure, discussing procedure in detail as well as risk benefits and alternatives, and coordination of care.  We also reviewed patient's imaging today in the office.   Current Outpatient Medications:  .  colchicine 0.6 MG tablet, Take 1 tablet (0.6 mg total) by mouth 2 (two) times daily., Disp: 60 tablet, Rfl: 2 .  ibuprofen (ADVIL) 800 MG tablet, Take 1 tablet (800 mg total) by mouth every 8 (eight) hours., Disp: 90 tablet, Rfl: 0 .  omeprazole (PRILOSEC) 40 MG capsule, Take 1 capsule (40 mg total) by mouth daily., Disp: 30 capsule, Rfl: 0   Garner Nash, DO [facility] 11/29/2019 4:10 PM

## 2019-11-29 NOTE — H&P (View-Only) (Signed)
Synopsis: Referred in August 2021 for abnormal pet imaging by No ref. provider found  Subjective:   PATIENT ID: Martin Spencer GENDER: male DOB: [birth year], MRN: [id number]  Chief Complaint  Patient presents with  . Consult    Recent Pets, CT    This is a 29 year old gentleman with recent CT scan of the chest with adenopathy as well as nuclear medicine pet imaging with PET avid adenopathy concerning for lymphoma.  Patient was referred to the office to discuss tissue biopsy via endobronchial ultrasound with transbronchial needle aspirations.  Patient was recently admitted to the hospital for acute pericarditis treated with ibuprofen and colchicine.  He had follow-up evaluation by the oncology office which recommended referral to pulmonary for tissue sampling.  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  PET scan imaging revealed hypermetabolic adenopathy in the pretracheal area as well as epicardial region.  Patient has no current symptoms.  Denies fevers night sweats weight loss or chills.   Past Medical History:  Diagnosis Date  . ADD (attention deficit disorder)   . Childhood asthma      Family History  Problem Relation Age of Onset  . Kidney cancer Father   . Colon cancer Paternal Grandfather      Past Surgical History:  Procedure Laterality Date  . APPENDECTOMY    . LAPAROSCOPIC APPENDECTOMY N/A 11/14/2017   Procedure: APPENDECTOMY LAPAROSCOPIC;  Surgeon: Georganna Skeans, MD;  Location: [facility];  Service: General;  Laterality: N/A;    Social History   Socioeconomic History  . Marital status: Single    Spouse name: Not on file  . Number of children: Not on file  . Years of education: Not on file  . Highest education level: Not on file  Occupational History  . Not on file  Tobacco Use  . Smoking status: Former Research scientist (life sciences)  . Smokeless tobacco: Never Used  Vaping Use  . Vaping Use: Never used  Substance and Sexual Activity  . Alcohol  use: Yes  . Drug use: Not Currently  . Sexual activity: Not on file  Other Topics Concern  . Not on file  Social History Narrative  . Not on file   Social Determinants of Health   Financial Resource Strain:   . Difficulty of Paying Living Expenses:   Food Insecurity:   . Worried About Charity fundraiser in the Last Year:   . Arboriculturist in the Last Year:   Transportation Needs:   . Film/video editor (Medical):   Marland Kitchen Lack of Transportation (Non-Medical):   Physical Activity:   . Days of Exercise per Week:   . Minutes of Exercise per Session:   Stress:   . Feeling of Stress :   Social Connections:   . Frequency of Communication with Friends and Family:   . Frequency of Social Gatherings with Friends and Family:   . Attends Religious Services:   . Active Member of Clubs or Organizations:   . Attends Archivist Meetings:   Marland Kitchen Marital Status:   Intimate Partner Violence:   . Fear of Current or Ex-Partner:   . Emotionally Abused:   Marland Kitchen Physically Abused:   . Sexually Abused:      Allergies  Allergen Reactions  . Other Other (See Comments)    Cats-sneezing,red itchy eyes     Outpatient Medications Prior to Visit  Medication Sig Dispense Refill  . colchicine 0.6 MG  tablet Take 1 tablet (0.6 mg total) by mouth 2 (two) times daily. 60 tablet 2  . ibuprofen (ADVIL) 800 MG tablet Take 1 tablet (800 mg total) by mouth every 8 (eight) hours. 90 tablet 0  . omeprazole (PRILOSEC) 40 MG capsule Take 1 capsule (40 mg total) by mouth daily. 30 capsule 0   No facility-administered medications prior to visit.    Review of Systems  Constitutional: Negative for chills, fever, malaise/fatigue and weight loss.  HENT: Negative for hearing loss, sore throat and tinnitus.   Eyes: Negative for blurred vision and double vision.  Respiratory: Negative for cough, hemoptysis, sputum production, shortness of breath, wheezing and stridor.   Cardiovascular: Negative for chest  pain, palpitations, orthopnea, leg swelling and PND.  Gastrointestinal: Negative for abdominal pain, constipation, diarrhea, heartburn, nausea and vomiting.  Genitourinary: Negative for dysuria, hematuria and urgency.  Musculoskeletal: Negative for joint pain and myalgias.  Skin: Negative for itching and rash.  Neurological: Negative for dizziness, tingling, weakness and headaches.  Endo/Heme/Allergies: Negative for environmental allergies. Does not bruise/bleed easily.  Psychiatric/Behavioral: Negative for depression. The patient is not nervous/anxious and does not have insomnia.   All other systems reviewed and are negative.    Objective:  Physical Exam Vitals reviewed.  Constitutional:      General: He is not in acute distress.    Appearance: He is well-developed.  HENT:     Head: Normocephalic and atraumatic.  Eyes:     General: No scleral icterus.    Conjunctiva/sclera: Conjunctivae normal.     Pupils: Pupils are equal, round, and reactive to light.  Neck:     Vascular: No JVD.     Trachea: No tracheal deviation.  Cardiovascular:     Rate and Rhythm: Normal rate and regular rhythm.     Heart sounds: Normal heart sounds. No murmur heard.   Pulmonary:     Effort: Pulmonary effort is normal. No tachypnea, accessory muscle usage or respiratory distress.     Breath sounds: No stridor. No wheezing, rhonchi or rales.  Abdominal:     General: Bowel sounds are normal. There is no distension.     Palpations: Abdomen is soft.     Tenderness: There is no abdominal tenderness.  Musculoskeletal:        General: No tenderness.     Cervical back: Neck supple.  Lymphadenopathy:     Cervical: No cervical adenopathy.  Skin:    General: Skin is warm and dry.     Capillary Refill: Capillary refill takes less than 2 seconds.     Findings: No rash.  Neurological:     Mental Status: He is alert and oriented to person, place, and time.  Psychiatric:        Behavior: Behavior normal.       Vitals:   11/29/19 1548  BP: 122/78  Pulse: 64  Temp: 98.6 F (37 C)  Weight: 184 lb 9.6 oz (83.7 kg)  Height: _0  (1.803 m)     on RA BMI Readings from Last 3 Encounters:  11/29/19 25.75 kg/m  11/24/19 25.41 kg/m  11/18/19 26.66 kg/m   Wt Readings from Last 3 Encounters:  11/29/19 184 lb 9.6 oz (83.7 kg)  11/24/19 182 lb 3.2 oz (82.6 kg)  11/18/19 191 lb 2.2 oz (86.7 kg)     CBC    Component Value Date/Time   WBC 7.1 11/19/2019 0501   RBC 4.36 11/19/2019 0501   HGB 12.7 (L) 11/19/2019 0501  HCT 38.5 (L) 11/19/2019 0501   PLT 215 11/19/2019 0501   MCV 88.3 11/19/2019 0501   MCH 29.1 11/19/2019 0501   MCHC 33.0 11/19/2019 0501   RDW 12.6 11/19/2019 0501   LYMPHSABS 2.7 11/19/2019 0501   MONOABS 0.8 11/19/2019 0501   EOSABS 0.1 11/19/2019 0501   BASOSABS 0.0 11/19/2019 0501     Chest Imaging: CT chest and nuclear medicine pet imaging. Large adenopathy within the pretracheal region as well as epicardial region.  Pet image with hypermetabolic adenopathy. The patient's images have been independently reviewed by me.    Pulmonary Functions Testing Results: No flowsheet data found.  FeNO:   Pathology:   Echocardiogram:   Heart Catheterization:     Assessment & Plan:     ICD-10-CM   1. Mediastinal adenopathy  R59.0 Ambulatory referral to Pulmonology  2. Abnormal PET scan of mediastinum  R94.8     Discussion:  Young gentleman 29 year old male with abnormal CT imaging and abnormal PET scan imaging concerning for potential underlying malignancy, lymphoma.  Plan: Today in the office we discussed risk benefits and alternatives of proceeding with video bronchoscopy with endobronchial ultrasound transbronchial needle aspirations. This patient needs urgent bronchoscopy. Early tissue diagnosis will help establish treatment plans. Patient is agreeable to have this procedure done as soon as possible.  Plans for video bronchoscopy with  endobronchial ultrasound tomorrow. Preop Covid testing tomorrow.  Greater than 50% of this patient's 60-minute of visit was spent face-to-face discussing above recommendations and treatment plan as well as planning for procedure, discussing procedure in detail as well as risk benefits and alternatives, and coordination of care.  We also reviewed patient's imaging today in the office.   Current Outpatient Medications:  .  colchicine 0.6 MG tablet, Take 1 tablet (0.6 mg total) by mouth 2 (two) times daily., Disp: 60 tablet, Rfl: 2 .  ibuprofen (ADVIL) 800 MG tablet, Take 1 tablet (800 mg total) by mouth every 8 (eight) hours., Disp: 90 tablet, Rfl: 0 .  omeprazole (PRILOSEC) 40 MG capsule, Take 1 capsule (40 mg total) by mouth daily., Disp: 30 capsule, Rfl: 0   Garner Nash, DO [facility] 11/29/2019 4:10 PM

## 2019-11-29 NOTE — Telephone Encounter (Signed)
Heard back from [facility]. They are seeing pt today and biopsy to be done on 12/01/19. Pt is aware and on his way to their clinic

## 2019-11-29 NOTE — Progress Notes (Signed)
Dr. Linna Caprice, Anesthesiology, reviewed pt EKG. MD advised that repeat EKG be performed on DOS.

## 2019-11-30 ENCOUNTER — Ambulatory Visit (HOSPITAL_COMMUNITY)
Admission: RE | Admit: 2019-11-30 | Discharge: 2019-11-30 | Disposition: A | Discharge status: Home / Self Care | Payer: BC Managed Care – PPO | Attending: Internal Medicine | Admitting: Internal Medicine

## 2019-11-30 ENCOUNTER — Ambulatory Visit (HOSPITAL_COMMUNITY): Payer: BC Managed Care – PPO | Admitting: Certified Registered"

## 2019-11-30 ENCOUNTER — Other Ambulatory Visit: Payer: Self-pay

## 2019-11-30 ENCOUNTER — Encounter (HOSPITAL_COMMUNITY): Payer: Self-pay | Admitting: Internal Medicine

## 2019-11-30 ENCOUNTER — Encounter (HOSPITAL_COMMUNITY)
Admission: RE | Disposition: A | Discharge status: Home / Self Care | Payer: Self-pay | Source: Home / Self Care | Attending: Internal Medicine

## 2019-11-30 DIAGNOSIS — R948 Abnormal results of function studies of other organs and systems: Secondary | ICD-10-CM

## 2019-11-30 DIAGNOSIS — Z87891 Personal history of nicotine dependence: Secondary | ICD-10-CM | POA: Diagnosis not present

## 2019-11-30 DIAGNOSIS — R590 Localized enlarged lymph nodes: Secondary | ICD-10-CM | POA: Diagnosis not present

## 2019-11-30 DIAGNOSIS — Z20822 Contact with and (suspected) exposure to covid-19: Secondary | ICD-10-CM | POA: Insufficient documentation

## 2019-11-30 DIAGNOSIS — R896 Abnormal cytological findings in specimens from other organs, systems and tissues: Secondary | ICD-10-CM | POA: Diagnosis not present

## 2019-11-30 DIAGNOSIS — Z79899 Other long term (current) drug therapy: Secondary | ICD-10-CM | POA: Insufficient documentation

## 2019-11-30 DIAGNOSIS — Z8709 Personal history of other diseases of the respiratory system: Secondary | ICD-10-CM | POA: Insufficient documentation

## 2019-11-30 DIAGNOSIS — Z9049 Acquired absence of other specified parts of digestive tract: Secondary | ICD-10-CM | POA: Diagnosis not present

## 2019-11-30 DIAGNOSIS — J45909 Unspecified asthma, uncomplicated: Secondary | ICD-10-CM | POA: Diagnosis not present

## 2019-11-30 HISTORY — PX: FINE NEEDLE ASPIRATION: SHX5430

## 2019-11-30 HISTORY — DX: Disease of pericardium, unspecified: I31.9

## 2019-11-30 HISTORY — PX: VIDEO BRONCHOSCOPY WITH ENDOBRONCHIAL ULTRASOUND: SHX6177

## 2019-11-30 HISTORY — DX: Malignant (primary) neoplasm, unspecified: C80.1

## 2019-11-30 LAB — APTT: aPTT: 35 seconds (ref 24–36)

## 2019-11-30 LAB — COMPREHENSIVE METABOLIC PANEL
ALT: 20 U/L (ref 0–44)
AST: 45 U/L — ABNORMAL HIGH (ref 15–41)
Albumin: 4.3 g/dL (ref 3.5–5.0)
Alkaline Phosphatase: 51 U/L (ref 38–126)
Anion gap: 10 (ref 5–15)
BUN: 14 mg/dL (ref 6–20)
CO2: 23 mmol/L (ref 22–32)
Calcium: 9.5 mg/dL (ref 8.9–10.3)
Chloride: 104 mmol/L (ref 98–111)
Creatinine, Ser: 0.85 mg/dL (ref 0.61–1.24)
GFR calc Af Amer: >60 mL/min (ref >60)
GFR calc non Af Amer: >60 mL/min (ref >60)
Glucose, Bld: 83 mg/dL (ref 70–99)
Potassium: 5.2 mmol/L — ABNORMAL HIGH (ref 3.5–5.1)
Sodium: 137 mmol/L (ref 135–145)
Total Bilirubin: 1.8 mg/dL — ABNORMAL HIGH (ref 0.3–1.2)
Total Protein: 7.2 g/dL (ref 6.5–8.1)

## 2019-11-30 LAB — PROTIME-INR
INR: 1 (ref 0.8–1.2)
Prothrombin Time: 12.9 seconds (ref 11.4–15.2)

## 2019-11-30 LAB — SARS CORONAVIRUS 2 BY RT PCR (HOSPITAL ORDER, PERFORMED IN ~~LOC~~ HOSPITAL LAB): SARS Coronavirus 2: NEGATIVE

## 2019-11-30 SURGERY — BRONCHOSCOPY, WITH EBUS
Anesthesia: General

## 2019-11-30 MED ORDER — ROCURONIUM BROMIDE 10 MG/ML (PF) SYRINGE
PREFILLED_SYRINGE | INTRAVENOUS | Status: DC | PRN
  Administered 2019-11-30: 50 mg via INTRAVENOUS

## 2019-11-30 MED ORDER — CHLORHEXIDINE GLUCONATE 0.12 % MT SOLN
OROMUCOSAL | Status: AC
  Administered 2019-11-30: 15 mL via OROMUCOSAL
  Filled 2019-11-30: qty 15

## 2019-11-30 MED ORDER — FENTANYL CITRATE (PF) 100 MCG/2ML IJ SOLN
INTRAMUSCULAR | Status: DC | PRN
  Administered 2019-11-30 (×2): 50 ug via INTRAVENOUS

## 2019-11-30 MED ORDER — LACTATED RINGERS IV SOLN: INTRAVENOUS | Status: DC

## 2019-11-30 MED ORDER — CHLORHEXIDINE GLUCONATE 0.12 % MT SOLN: 15.0000 mL | Freq: Once | OROMUCOSAL | Status: AC

## 2019-11-30 MED ORDER — EPHEDRINE SULFATE-NACL 50-0.9 MG/10ML-% IV SOSY
PREFILLED_SYRINGE | INTRAVENOUS | Status: DC | PRN
  Administered 2019-11-30 (×2): 5 mg via INTRAVENOUS

## 2019-11-30 MED ORDER — MIDAZOLAM HCL 5 MG/5ML IJ SOLN
INTRAMUSCULAR | Status: DC | PRN
  Administered 2019-11-30: 2 mg via INTRAVENOUS

## 2019-11-30 MED ORDER — GLYCOPYRROLATE PF 0.2 MG/ML IJ SOSY
PREFILLED_SYRINGE | INTRAMUSCULAR | Status: DC | PRN
Start: 2019-11-30 — End: 2019-11-30
  Administered 2019-11-30: .1 mg via INTRAVENOUS

## 2019-11-30 MED ORDER — DEXMEDETOMIDINE (PRECEDEX) IN NS 20 MCG/5ML (4 MCG/ML) IV SYRINGE
PREFILLED_SYRINGE | INTRAVENOUS | Status: DC | PRN
  Administered 2019-11-30: 20 ug via INTRAVENOUS

## 2019-11-30 MED ORDER — PROPOFOL 10 MG/ML IV BOLUS
INTRAVENOUS | Status: DC | PRN
  Administered 2019-11-30: 200 mg via INTRAVENOUS

## 2019-11-30 MED ORDER — DEXAMETHASONE SODIUM PHOSPHATE 10 MG/ML IJ SOLN
INTRAMUSCULAR | Status: DC | PRN
  Administered 2019-11-30: 10 mg via INTRAVENOUS

## 2019-11-30 MED ORDER — ONDANSETRON HCL 4 MG/2ML IJ SOLN
INTRAMUSCULAR | Status: DC | PRN
  Administered 2019-11-30: 4 mg via INTRAVENOUS

## 2019-11-30 MED ORDER — LIDOCAINE 2% (20 MG/ML) 5 ML SYRINGE
INTRAMUSCULAR | Status: DC | PRN
  Administered 2019-11-30: 100 mg via INTRAVENOUS

## 2019-11-30 MED ORDER — LACTATED RINGERS IV SOLN: INTRAVENOUS | Status: DC | PRN

## 2019-11-30 SURGICAL SUPPLY — 35 items
ADAPTER VALVE BIOPSY EBUS (MISCELLANEOUS) IMPLANT
ADPTR VALVE BIOPSY EBUS (MISCELLANEOUS)
BRUSH CYTOL CELLEBRITY 1.5X140 (MISCELLANEOUS) IMPLANT
CANISTER SUCT 3000ML PPV (MISCELLANEOUS) ×3 IMPLANT
CONT SPEC 4OZ CLIKSEAL STRL BL (MISCELLANEOUS) ×3 IMPLANT
COVER BACK TABLE 60X90IN (DRAPES) ×3 IMPLANT
FORCEPS BIOP RJ4 1.8 (CUTTING FORCEPS) IMPLANT
GAUZE SPONGE 4X4 12PLY STRL (GAUZE/BANDAGES/DRESSINGS) ×3 IMPLANT
GLOVE BIO SURGEON STRL SZ7.5 (GLOVE) ×3 IMPLANT
GOWN STRL REUS W/ TWL LRG LVL3 (GOWN DISPOSABLE) ×2 IMPLANT
GOWN STRL REUS W/ TWL XL LVL3 (GOWN DISPOSABLE) ×2 IMPLANT
GOWN STRL REUS W/TWL LRG LVL3 (GOWN DISPOSABLE) ×3
GOWN STRL REUS W/TWL XL LVL3 (GOWN DISPOSABLE) ×3
KIT CLEAN ENDO COMPLIANCE (KITS) ×6 IMPLANT
KIT TURNOVER KIT B (KITS) ×3 IMPLANT
MARKER SKIN DUAL TIP RULER LAB (MISCELLANEOUS) ×3 IMPLANT
NDL ASPIRATION VIZISHOT 19G (NEEDLE) IMPLANT
NDL ASPIRATION VIZISHOT 21G (NEEDLE) IMPLANT
NEEDLE ASPIRATION VIZISHOT 19G (NEEDLE) IMPLANT
NEEDLE ASPIRATION VIZISHOT 21G (NEEDLE) IMPLANT
NS IRRIG 1000ML POUR BTL (IV SOLUTION) ×3 IMPLANT
OIL SILICONE PENTAX (PARTS (SERVICE/REPAIRS)) ×3 IMPLANT
PAD ARMBOARD 7.5X6 YLW CONV (MISCELLANEOUS) ×6 IMPLANT
SYR 20ML ECCENTRIC (SYRINGE) ×6 IMPLANT
SYR 20ML LL LF (SYRINGE) ×6 IMPLANT
SYR 50ML SLIP (SYRINGE) IMPLANT
SYR 5ML LUER SLIP (SYRINGE) ×3 IMPLANT
TOWEL GREEN STERILE FF (TOWEL DISPOSABLE) ×3 IMPLANT
TRAP SPECIMEN MUCOUS 40CC (MISCELLANEOUS) IMPLANT
TUBE CONNECTING 20X1/4 (TUBING) ×6 IMPLANT
UNDERPAD 30X30 (UNDERPADS AND DIAPERS) ×3 IMPLANT
VALVE BIOPSY  SINGLE USE (MISCELLANEOUS) ×3
VALVE BIOPSY SINGLE USE (MISCELLANEOUS) ×2 IMPLANT
VALVE SUCTION BRONCHIO DISP (MISCELLANEOUS) ×3 IMPLANT
WATER STERILE IRR 1000ML POUR (IV SOLUTION) ×3 IMPLANT

## 2019-11-30 NOTE — Transfer of Care (Signed)
Immediate Anesthesia Transfer of Care Note  Patient: Martin Spencer  Procedure(s) Performed: VIDEO BRONCHOSCOPY WITH ENDOBRONCHIAL ULTRASOUND (N/A ) FINE NEEDLE ASPIRATION (FNA) LINEAR  Patient Location: Endoscopy Unit  Anesthesia Type:General  Level of Consciousness: drowsy  Airway & Oxygen Therapy: Patient Spontanous Breathing and Patient connected to face mask oxygen  Post-op Assessment: Report given to RN, Post -op Vital signs reviewed and stable and Patient moving all extremities X 4  Post vital signs: Reviewed and stable  Last Vitals:  Vitals Value Taken Time  BP    Temp    Pulse    Resp    SpO2      Last Pain:  Vitals:   11/30/19 1053  TempSrc:   PainSc: 4          Complications: No complications documented.

## 2019-11-30 NOTE — Discharge Instructions (Signed)
Flexible Bronchoscopy, Care After This sheet gives you information about how to care for yourself after your test. Your doctor may also give you more specific instructions. If you have problems or questions, contact your doctor. Follow these instructions at home: Driving  Do not drive for 24 hours if you were given a medicine to help you relax (sedative).  Do not drive or use heavy machinery while taking prescription pain medicine. General instructions   Take over-the-counter and prescription medicines only as told by your doctor.  Return to your normal activities as told. Ask what activities are safe for you.  Do not use any products that have nicotine or tobacco in them. This includes cigarettes and e-cigarettes. If you need help quitting, ask your doctor.  Keep all follow-up visits as told by your doctor. This is important. It is very important if you had a tissue sample (biopsy) taken. Get help right away if:  You have shortness of breath that gets worse.  You get light-headed.  You feel like you are going to pass out (faint).  You have chest pain.  You cough up: ? More than a little blood. ? More blood than before. Summary  Do not eat or drink anything (not even water) for 2 hours after your test, or until your numbing medicine wears off.  Do not use cigarettes. Do not use e-cigarettes.  Get help right away if you have chest pain. This information is not intended to replace advice given to you by your health care provider. Make sure you discuss any questions you have with your health care provider. Document Revised: 03/26/2017 Document Reviewed: 05/01/2016 Elsevier Patient Education  2020 [employer].

## 2019-11-30 NOTE — Interval H&P Note (Signed)
History and Physical Interval Note:  11/30/2019 12:37 PM  Martin Spencer  has presented today for surgery, with the diagnosis of adenopathy.  The various methods of treatment have been discussed with the patient and family. After consideration of risks, benefits and other options for treatment, the patient has consented to  Procedure(s): [place] (N/A) as a surgical intervention.  The patient's history has been reviewed, patient examined, no change in status, stable for surgery.  I have reviewed the patient's chart and labs.  Questions were answered to the patient's satisfaction.     Candee Furbish

## 2019-11-30 NOTE — Op Note (Signed)
Flexible and EBUS Bronchoscopy Procedure Note  ANGELINO RUMERY  [id number]  1990/07/15  Date:11/30/19  Time:3:54 PM   Provider Performing:Marielouise Amey B Dontavia Brand   Procedure: Flexible bronchoscopy and EBUS Bronchoscopy  Indication(s) Anterior mediastinal mass and adenopathy  Consent Risks of the procedure as well as the alternatives and risks of each were explained to the patient and/or caregiver.  Consent for the procedure was obtained.  Anesthesia General Anesthesia   Time Out Verified patient identification, verified procedure, site/side was marked, verified correct patient position, special equipment/implants available, medications/allergies/relevant history reviewed, required imaging and test results available.   Sterile Technique Usual hand hygiene, masks, gowns, and gloves were used   Procedure Description The EBUS bronchoscope was advanced into airway with station 4L biopsied and sent for slide, cell block, and/or culture.  The EBUS bronchoscope was removed.  Diagnostic bronchoscope was then advanced through endotracheal tube and into airway.  Airways were examined down to subsegmental level with no abnormal findings. There was no active bleeding from biopsy site and small amounts of bloddy secretions were suctioned to clarity. The diagnostic bronchoscope was then removed completing the procedure.  Findings: - Left Paratracheal mass measuring 7.4-1UL   Complications/Tolerance None; patient tolerated the procedure well. Chest X-ray is needed post procedure.   EBL Minimal   Specimen(s) Cytology specimens sent for cell block and slides

## 2019-11-30 NOTE — Anesthesia Procedure Notes (Signed)
Procedure Name: Intubation Date/Time: 11/30/2019 12:47 PM Performed by: Orlie Dakin, CRNA Pre-anesthesia Checklist: Patient identified, Emergency Drugs available, Suction available and Patient being monitored Patient Re-evaluated:Patient Re-evaluated prior to induction Oxygen Delivery Method: Circle system utilized Preoxygenation: Pre-oxygenation with 100% oxygen Induction Type: IV induction Ventilation: Mask ventilation without difficulty Laryngoscope Size: Miller and 3 Tube type: Oral Tube size: 8.5 mm Number of attempts: 1 Airway Equipment and Method: Stylet Placement Confirmation: ETT inserted through vocal cords under direct vision,  positive ETCO2 and breath sounds checked- equal and bilateral Secured at: 23 cm Tube secured with: Tape Dental Injury: Teeth and Oropharynx as per pre-operative assessment  Comments: 4x4s bite block used at end of proc.

## 2019-11-30 NOTE — Anesthesia Postprocedure Evaluation (Signed)
Anesthesia Post Note  Patient: Martin Spencer  Procedure(s) Performed: VIDEO BRONCHOSCOPY WITH ENDOBRONCHIAL ULTRASOUND (N/A ) FINE NEEDLE ASPIRATION (FNA) LINEAR     Patient location during evaluation: PACU Anesthesia Type: General Level of consciousness: sedated and patient cooperative Pain management: pain level controlled Vital Signs Assessment: post-procedure vital signs reviewed and stable Respiratory status: spontaneous breathing Cardiovascular status: stable Anesthetic complications: no   No complications documented.  Last Vitals:  Vitals:   11/30/19 1440 11/30/19 1450  BP: 117/69 118/60  Pulse: 81 80  Resp: 11 11  Temp:    SpO2: 99% 99%    Last Pain:  Vitals:   11/30/19 1405  TempSrc: Oral  PainSc: 0-No pain                 Nolon Nations

## 2019-11-30 NOTE — Anesthesia Preprocedure Evaluation (Addendum)
Anesthesia Evaluation  Patient identified by MRN, date of birth, ID band Patient awake    Reviewed: Allergy & Precautions, H&P , NPO status , Patient's Chart, lab work & pertinent test results  Airway Mallampati: I  TM Distance: >3 FB Neck ROM: Full    Dental  (+) Dental Advisory Given, Teeth Intact, Chipped,    Pulmonary asthma , former smoker,    breath sounds clear to auscultation       Cardiovascular negative cardio ROS   Rhythm:regular Rate:Normal  Echo 10/2019 1. Left ventricular ejection fraction, by estimation, is 65%. The left ventricle has normal function. The left ventricle has no regional wall motion abnormalities. Left ventricular diastolic parameters were normal.  2. Right ventricular systolic function is normal. The right ventricular size is normal. Tricuspid regurgitation signal is inadequate for assessing PA pressure.  3. The mitral valve is normal in structure. Trivial mitral valve regurgitation. No evidence of mitral stenosis.  4. The aortic valve is tricuspid. Aortic valve regurgitation is not visualized. No aortic stenosis is present.  5. No features of constrictive physiology. Trace pericardial fluid.  6. The inferior vena cava is normal in size with greater than 50% respiratory variability, suggesting right atrial pressure of 3 mmHg.    Neuro/Psych PSYCHIATRIC DISORDERS    GI/Hepatic   Endo/Other    Renal/GU      Musculoskeletal   Abdominal   Peds  Hematology   Anesthesia Other Findings   Reproductive/Obstetrics                          Anesthesia Physical  Anesthesia Plan  ASA: II  Anesthesia Plan: General   Post-op Pain Management:    Induction: Intravenous  PONV Risk Score and Plan: 2 and Ondansetron, Dexamethasone, Midazolam and Treatment may vary due to age or medical condition  Airway Management Planned: Oral ETT  Additional Equipment:   Intra-op  Plan:   Post-operative Plan: Extubation in OR  Informed Consent: I have reviewed the patients History and Physical, chart, labs and discussed the procedure including the risks, benefits and alternatives for the proposed anesthesia with the patient or authorized representative who has indicated his/her understanding and acceptance.     Dental advisory given  Plan Discussed with: CRNA  Anesthesia Plan Comments:         Anesthesia Quick Evaluation

## 2019-12-01 ENCOUNTER — Encounter (HOSPITAL_COMMUNITY): Payer: Self-pay | Admitting: Internal Medicine

## 2019-12-01 LAB — CYTOLOGY - NON PAP

## 2019-12-04 ENCOUNTER — Telehealth: Payer: Self-pay | Admitting: Hematology and Oncology

## 2019-12-04 NOTE — Telephone Encounter (Signed)
Scheduled per 8/9 sch message. Pt is aware of appt time and date. 

## 2019-12-05 ENCOUNTER — Encounter: Payer: Self-pay | Admitting: Thoracic Surgery (Cardiothoracic Vascular Surgery)

## 2019-12-05 ENCOUNTER — Other Ambulatory Visit: Payer: Self-pay

## 2019-12-05 ENCOUNTER — Institutional Professional Consult (permissible substitution): Payer: BC Managed Care – PPO | Admitting: Thoracic Surgery (Cardiothoracic Vascular Surgery)

## 2019-12-05 VITALS — BP 122/75 | HR 67 | Resp 20 | Ht 71.0 in | Wt 184.0 lb

## 2019-12-05 DIAGNOSIS — J9859 Other diseases of mediastinum, not elsewhere classified: Secondary | ICD-10-CM

## 2019-12-05 DIAGNOSIS — R590 Localized enlarged lymph nodes: Secondary | ICD-10-CM

## 2019-12-05 NOTE — Progress Notes (Signed)
PCP is Patient, No Pcp Per Referring Provider is Martin Nash, DO  Chief Complaint  Patient presents with   Mediastinal Mass    Surgical consult, EBUS 11/30/19, CTA Chest/Abd/Pelvis 11/19/19, PET ScN 11/24/19, ECHO 11/18/19    HPI: [name] is sent for consultation regarding anterior mediastinal mass and mediastinal adenopathy.  [facility] is a 29 year old young man with a history of attention deficit disorder and childhood asthma.  He was in his usual state of health until mid July when he developed chest pain.  He had severe pain in the chest which would radiate to his neck.  It was particularly worse with coughing or deep breathing.  He was admitted and diagnosed with pericarditis.  He had a CT angio to rule out pulmonary embolus.  He was found to have an anterior mediastinal mass and some right paratracheal adenopathy.  He saw Dr. Lorenso [facility].  A PET/CT showed marked hypermetabolic activity both in the anterior mediastinal mass in the right paratracheal lymph nodes.  Endobronchial ultrasound was nondiagnostic.  He says he was having some night sweats and hot flashes.  No fevers that he is aware of.  Still has some pain with coughing and deep breathing.  No change in appetite or weight loss.   Past Medical History:  Diagnosis Date   ADD (attention deficit disorder)    Cancer ([facility])    Childhood asthma    Pericarditis     Past Surgical History:  Procedure Laterality Date   APPENDECTOMY     FINE NEEDLE ASPIRATION  11/30/2019   Procedure: FINE NEEDLE ASPIRATION (FNA) LINEAR;  Surgeon: Martin Furbish, MD;  Location: [facility] ENDOSCOPY;  Service: Pulmonary;;   LAPAROSCOPIC APPENDECTOMY N/A 11/14/2017   Procedure: APPENDECTOMY LAPAROSCOPIC;  Surgeon: Martin Skeans, MD;  Location: [facility];  Service: General;  Laterality: N/A;   VIDEO BRONCHOSCOPY WITH ENDOBRONCHIAL ULTRASOUND N/A 11/30/2019   Procedure: VIDEO BRONCHOSCOPY WITH ENDOBRONCHIAL ULTRASOUND;  Surgeon: Martin Furbish, MD;   Location: [facility];  Service: Pulmonary;  Laterality: N/A;   WISDOM TOOTH EXTRACTION      Family History  Problem Relation Age of Onset   Kidney cancer Father    Colon cancer Paternal Grandfather     Social History Social History   Tobacco Use   Smoking status: Former Smoker    Types: Cigarettes   Smokeless tobacco: Never Used   Tobacco comment: quit in high school  Vaping Use   Vaping Use: Never used  Substance Use Topics   Alcohol use: Yes   Drug use: Not Currently    Types: Marijuana    Comment: CBD last use 2 weeks ago ( 11/29/19)    Current Outpatient Medications  Medication Sig Dispense Refill   colchicine 0.6 MG tablet Take 1 tablet (0.6 mg total) by mouth 2 (two) times daily. 60 tablet 2   ibuprofen (ADVIL) 800 MG tablet Take 1 tablet (800 mg total) by mouth every 8 (eight) hours. 90 tablet 0   omeprazole (PRILOSEC) 40 MG capsule Take 1 capsule (40 mg total) by mouth daily. 30 capsule 0   No current facility-administered medications for this visit.    Allergies  Allergen Reactions   Other Other (See Comments)    Cats-sneezing,red itchy eyes    Review of Systems  Constitutional: Positive for diaphoresis. Negative for appetite change and unexpected weight change.  HENT: Negative for trouble swallowing and voice change.   Eyes: Negative for visual disturbance.  Respiratory: Negative for shortness of breath.  Cardiovascular: Positive for chest pain. Negative for palpitations and leg swelling.  Gastrointestinal: Negative for abdominal distention and abdominal pain.  Genitourinary: Negative for difficulty urinating and dysuria.  Neurological: Negative for seizures and weakness.  Hematological: Negative for adenopathy. Does not bruise/bleed easily.  All other systems reviewed and are negative.   BP 122/75    Pulse 67    Resp 20    Ht 5\' 11"  (1.803 m)    Wt 184 lb (83.5 kg)    SpO2 98% Comment: RA   BMI 25.66 kg/m  Physical Exam Vitals  reviewed.  Constitutional:      General: He is not in acute distress.    Appearance: Normal appearance.  HENT:     Head: Normocephalic and atraumatic.  Eyes:     General: No scleral icterus.    Extraocular Movements: Extraocular movements intact.  Cardiovascular:     Rate and Rhythm: Normal rate and regular rhythm.     Heart sounds: No murmur heard.  Friction rub (Faint) present. No gallop.   Pulmonary:     Effort: Pulmonary effort is normal. No respiratory distress.     Breath sounds: Normal breath sounds. No wheezing or rales.  Abdominal:     General: There is no distension.     Palpations: Abdomen is soft.     Tenderness: There is no abdominal tenderness.  Musculoskeletal:        General: No swelling.     Cervical back: Neck supple.  Lymphadenopathy:     Cervical: No cervical adenopathy.  Skin:    General: Skin is warm and dry.  Neurological:     General: No focal deficit present.     Mental Status: He is alert and oriented to person, place, and time.     Cranial Nerves: No cranial nerve deficit.     Motor: No weakness.     Gait: Gait normal.    Diagnostic Tests: NUCLEAR MEDICINE PET SKULL BASE TO THIGH  TECHNIQUE: 10.3 mCi F-18 FDG was injected intravenously. Full-ring PET imaging was performed from the skull base to thigh after the radiotracer. CT data was obtained and used for attenuation correction and anatomic localization.  Fasting blood glucose: 94 mg/dl  COMPARISON:  11/17/2019 CTA chest.  11/19/2019 abdominopelvic CT.  FINDINGS: Mediastinal blood pool activity: SUV max 1.9  Liver activity: SUV max 3.4  NECK: No areas of abnormal hypermetabolism.  Incidental CT findings: No cervical adenopathy. Left maxillary sinus mucous retention cyst or polyp.  CHEST: Anterior mid images mediastinal hypermetabolic adenopathy. Index right paratracheal node measures 2.4 cm and a S.U.V. max of 11.0 on 61/4.  Anterior mediastinal nodal mass measures 6.1  x 3.0 cm and a S.U.V. max of 25.3 on 73/4  Small left axillary nodes with low-level hypermetabolism, including at a S.U.V. max of 3.5. Likely secondary to recent vaccine.  Incidental CT findings: Deferred to recent diagnostic CT.  ABDOMEN/PELVIS: scattered foci of sigmoid hypermetabolism are favored to be physiologic. No hepatic hypermetabolism, including at the site of left liver lobe abnormality on abdominal CT of 11/19/2019. This is most consistent with focal steatosis. No abdominopelvic nodal hypermetabolism.  Incidental CT findings: Normal adrenal glands. No abdominopelvic adenopathy. Appendectomy.  SKELETON: No abnormal marrow activity.  Incidental CT findings: none  IMPRESSION: Hypermetabolic mediastinal adenopathy, consistent with lymphoma. ([place]) 5. No extrathoracic disease.   Electronically Signed   By: Abigail Miyamoto M.D.   On: 11/24/2019 14:09 I personally reviewed the PET CT images and concur with the  findings noted above  Impression: [facility] is a 29 year old young man previously healthy who recently presented with chest pain.  He was found to have pericarditis.  During his work-up he was found to have an anterior mediastinal mass and right paratracheal adenopathy.  Both of those sites are markedly hypermetabolic on PET/CT.  I reviewed the CT and PET/CT images with Mr. Anselmi and his parents.  Differential diagnosis is primarily lymphoma versus germ cell tumor.  Lymphoma, specifically Hodgkin's is the most likely diagnosis.  He had an endobronchial ultrasound which not surprisingly was nondiagnostic.  He needs a tissue diagnosis so that appropriate treatment can be initiated.  Options in his case include VATS, Chamberlain procedure, and mediastinoscopy.  Of those mediastinoscopy offers the least discomfort and quickest recovery.  I described the proposed procedure to Mr. Simington and his parents.  I informed them of the general nature of the  procedure including the need for general anesthesia, the incision to be used, the outpatient nature of the procedure, and the recovery.  I informed him of the indications, risks, benefits, and alternatives.  They understand the risks include, but are not limited to DVT, PE, bleeding, possible need for sternotomy or thoracotomy, pneumothorax, recurrent nerve injury, as well as possibility of other unstable complications.  He accepts the risks and wishes to proceed.  Plan: Mediastinoscopy on Friday, 12/08/2019  Melrose Nakayama, MD [facility] [phone]

## 2019-12-05 NOTE — H&P (View-Only) (Signed)
PCP is Patient, No Pcp Per Referring Provider is Icard, Octavio Graves, DO  Chief Complaint  Patient presents with  . Mediastinal Mass    Surgical consult, EBUS 11/30/19, CTA Chest/Abd/Pelvis 11/19/19, PET ScN 11/24/19, ECHO 11/18/19    HPI: [name] is sent for consultation regarding anterior mediastinal mass and mediastinal adenopathy.  [facility] is a 29 year old young man with a history of attention deficit disorder and childhood asthma.  He was in his usual state of health until mid July when he developed chest pain.  He had severe pain in the chest which would radiate to his neck.  It was particularly worse with coughing or deep breathing.  He was admitted and diagnosed with pericarditis.  He had a CT angio to rule out pulmonary embolus.  He was found to have an anterior mediastinal mass and some right paratracheal adenopathy.  He saw Dr. Lorenso Courier.  A PET/CT showed marked hypermetabolic activity both in the anterior mediastinal mass in the right paratracheal lymph nodes.  Endobronchial ultrasound was nondiagnostic.  He says he was having some night sweats and hot flashes.  No fevers that he is aware of.  Still has some pain with coughing and deep breathing.  No change in appetite or weight loss.   Past Medical History:  Diagnosis Date  . ADD (attention deficit disorder)   . Cancer ([place])   . Childhood asthma   . Pericarditis     Past Surgical History:  Procedure Laterality Date  . APPENDECTOMY    . FINE NEEDLE ASPIRATION  11/30/2019   Procedure: FINE NEEDLE ASPIRATION (FNA) LINEAR;  Surgeon: Candee Furbish, MD;  Location: [facility] ENDOSCOPY;  Service: Pulmonary;;  . LAPAROSCOPIC APPENDECTOMY N/A 11/14/2017   Procedure: APPENDECTOMY LAPAROSCOPIC;  Surgeon: Georganna Skeans, MD;  Location: [place];  Service: General;  Laterality: N/A;  . VIDEO BRONCHOSCOPY WITH ENDOBRONCHIAL ULTRASOUND N/A 11/30/2019   Procedure: VIDEO BRONCHOSCOPY WITH ENDOBRONCHIAL ULTRASOUND;  Surgeon: Candee Furbish, MD;   Location: [facility] ENDOSCOPY;  Service: Pulmonary;  Laterality: N/A;  . WISDOM TOOTH EXTRACTION      Family History  Problem Relation Age of Onset  . Kidney cancer Father   . Colon cancer Paternal Grandfather     Social History Social History   Tobacco Use  . Smoking status: Former Smoker    Types: Cigarettes  . Smokeless tobacco: Never Used  . Tobacco comment: quit in high school  Vaping Use  . Vaping Use: Never used  Substance Use Topics  . Alcohol use: Yes  . Drug use: Not Currently    Types: Marijuana    Comment: CBD last use 2 weeks ago ( 11/29/19)    Current Outpatient Medications  Medication Sig Dispense Refill  . colchicine 0.6 MG tablet Take 1 tablet (0.6 mg total) by mouth 2 (two) times daily. 60 tablet 2  . ibuprofen (ADVIL) 800 MG tablet Take 1 tablet (800 mg total) by mouth every 8 (eight) hours. 90 tablet 0  . omeprazole (PRILOSEC) 40 MG capsule Take 1 capsule (40 mg total) by mouth daily. 30 capsule 0   No current facility-administered medications for this visit.    Allergies  Allergen Reactions  . Other Other (See Comments)    Cats-sneezing,red itchy eyes    Review of Systems  Constitutional: Positive for diaphoresis. Negative for appetite change and unexpected weight change.  HENT: Negative for trouble swallowing and voice change.   Eyes: Negative for visual disturbance.  Respiratory: Negative for shortness of breath.  Cardiovascular: Positive for chest pain. Negative for palpitations and leg swelling.  Gastrointestinal: Negative for abdominal distention and abdominal pain.  Genitourinary: Negative for difficulty urinating and dysuria.  Neurological: Negative for seizures and weakness.  Hematological: Negative for adenopathy. Does not bruise/bleed easily.  All other systems reviewed and are negative.   BP 122/75   Pulse 67   Resp 20   Ht 5\' 11"  (1.803 m)   Wt 184 lb (83.5 kg)   SpO2 98% Comment: RA  BMI 25.66 kg/m  Physical Exam Vitals  reviewed.  Constitutional:      General: He is not in acute distress.    Appearance: Normal appearance.  HENT:     Head: Normocephalic and atraumatic.  Eyes:     General: No scleral icterus.    Extraocular Movements: Extraocular movements intact.  Cardiovascular:     Rate and Rhythm: Normal rate and regular rhythm.     Heart sounds: No murmur heard.  Friction rub (Faint) present. No gallop.   Pulmonary:     Effort: Pulmonary effort is normal. No respiratory distress.     Breath sounds: Normal breath sounds. No wheezing or rales.  Abdominal:     General: There is no distension.     Palpations: Abdomen is soft.     Tenderness: There is no abdominal tenderness.  Musculoskeletal:        General: No swelling.     Cervical back: Neck supple.  Lymphadenopathy:     Cervical: No cervical adenopathy.  Skin:    General: Skin is warm and dry.  Neurological:     General: No focal deficit present.     Mental Status: He is alert and oriented to person, place, and time.     Cranial Nerves: No cranial nerve deficit.     Motor: No weakness.     Gait: Gait normal.    Diagnostic Tests: NUCLEAR MEDICINE PET SKULL BASE TO THIGH  TECHNIQUE: 10.3 mCi F-18 FDG was injected intravenously. Full-ring PET imaging was performed from the skull base to thigh after the radiotracer. CT data was obtained and used for attenuation correction and anatomic localization.  Fasting blood glucose: 94 mg/dl  COMPARISON:  11/17/2019 CTA chest.  11/19/2019 abdominopelvic CT.  FINDINGS: Mediastinal blood pool activity: SUV max 1.9  Liver activity: SUV max 3.4  NECK: No areas of abnormal hypermetabolism.  Incidental CT findings: No cervical adenopathy. Left maxillary sinus mucous retention cyst or polyp.  CHEST: Anterior mid images mediastinal hypermetabolic adenopathy. Index right paratracheal node measures 2.4 cm and a S.U.V. max of 11.0 on 61/4.  Anterior mediastinal nodal mass measures 6.1  x 3.0 cm and a S.U.V. max of 25.3 on 73/4  Small left axillary nodes with low-level hypermetabolism, including at a S.U.V. max of 3.5. Likely secondary to recent vaccine.  Incidental CT findings: Deferred to recent diagnostic CT.  ABDOMEN/PELVIS: scattered foci of sigmoid hypermetabolism are favored to be physiologic. No hepatic hypermetabolism, including at the site of left liver lobe abnormality on abdominal CT of 11/19/2019. This is most consistent with focal steatosis. No abdominopelvic nodal hypermetabolism.  Incidental CT findings: Normal adrenal glands. No abdominopelvic adenopathy. Appendectomy.  SKELETON: No abnormal marrow activity.  Incidental CT findings: none  IMPRESSION: Hypermetabolic mediastinal adenopathy, consistent with lymphoma. ([place]) 5. No extrathoracic disease.   Electronically Signed   By: Abigail Miyamoto M.D.   On: 11/24/2019 14:09 I personally reviewed the PET CT images and concur with the findings noted above  Impression: Tiffany Kocher  Duarte is a 29 year old young man previously healthy who recently presented with chest pain.  He was found to have pericarditis.  During his work-up he was found to have an anterior mediastinal mass and right paratracheal adenopathy.  Both of those sites are markedly hypermetabolic on PET/CT.  I reviewed the CT and PET/CT images with Mr. Morten and his parents.  Differential diagnosis is primarily lymphoma versus germ cell tumor.  Lymphoma, specifically Hodgkin's is the most likely diagnosis.  He had an endobronchial ultrasound which not surprisingly was nondiagnostic.  He needs a tissue diagnosis so that appropriate treatment can be initiated.  Options in his case include VATS, Chamberlain procedure, and mediastinoscopy.  Of those mediastinoscopy offers the least discomfort and quickest recovery.  I described the proposed procedure to Mr. Verdi and his parents.  I informed them of the general nature of the  procedure including the need for general anesthesia, the incision to be used, the outpatient nature of the procedure, and the recovery.  I informed him of the indications, risks, benefits, and alternatives.  They understand the risks include, but are not limited to DVT, PE, bleeding, possible need for sternotomy or thoracotomy, pneumothorax, recurrent nerve injury, as well as possibility of other unstable complications.  He accepts the risks and wishes to proceed.  Plan: Mediastinoscopy on Friday, 12/08/2019  Melrose Nakayama, MD [facility] [phone]

## 2019-12-06 ENCOUNTER — Ambulatory Visit (HOSPITAL_COMMUNITY)
Admission: RE | Admit: 2019-12-06 | Discharge: 2019-12-06 | Disposition: A | Discharge status: Home / Self Care | Payer: BC Managed Care – PPO | Source: Ambulatory Visit | Attending: Thoracic Surgery (Cardiothoracic Vascular Surgery) | Admitting: Thoracic Surgery (Cardiothoracic Vascular Surgery)

## 2019-12-06 ENCOUNTER — Encounter (HOSPITAL_COMMUNITY): Payer: Self-pay

## 2019-12-06 ENCOUNTER — Other Ambulatory Visit: Payer: Self-pay

## 2019-12-06 ENCOUNTER — Encounter (HOSPITAL_COMMUNITY)
Admission: RE | Admit: 2019-12-06 | Discharge: 2019-12-06 | Disposition: A | Discharge status: Home / Self Care | Payer: BC Managed Care – PPO | Source: Ambulatory Visit | Attending: Thoracic Surgery (Cardiothoracic Vascular Surgery) | Admitting: Thoracic Surgery (Cardiothoracic Vascular Surgery)

## 2019-12-06 ENCOUNTER — Other Ambulatory Visit (HOSPITAL_COMMUNITY)
Admission: RE | Admit: 2019-12-06 | Discharge: 2019-12-06 | Disposition: A | Discharge status: Home / Self Care | Payer: BC Managed Care – PPO | Source: Ambulatory Visit | Attending: Thoracic Surgery (Cardiothoracic Vascular Surgery) | Admitting: Thoracic Surgery (Cardiothoracic Vascular Surgery)

## 2019-12-06 DIAGNOSIS — Z87891 Personal history of nicotine dependence: Secondary | ICD-10-CM | POA: Diagnosis not present

## 2019-12-06 DIAGNOSIS — Z01818 Encounter for other preprocedural examination: Secondary | ICD-10-CM | POA: Diagnosis not present

## 2019-12-06 DIAGNOSIS — Z9049 Acquired absence of other specified parts of digestive tract: Secondary | ICD-10-CM | POA: Diagnosis not present

## 2019-12-06 DIAGNOSIS — Z79899 Other long term (current) drug therapy: Secondary | ICD-10-CM | POA: Diagnosis not present

## 2019-12-06 DIAGNOSIS — Z20822 Contact with and (suspected) exposure to covid-19: Secondary | ICD-10-CM | POA: Insufficient documentation

## 2019-12-06 DIAGNOSIS — C8192 Hodgkin lymphoma, unspecified, intrathoracic lymph nodes: Secondary | ICD-10-CM | POA: Diagnosis not present

## 2019-12-06 DIAGNOSIS — Z8709 Personal history of other diseases of the respiratory system: Secondary | ICD-10-CM | POA: Diagnosis not present

## 2019-12-06 DIAGNOSIS — R590 Localized enlarged lymph nodes: Secondary | ICD-10-CM | POA: Diagnosis not present

## 2019-12-06 DIAGNOSIS — J45909 Unspecified asthma, uncomplicated: Secondary | ICD-10-CM | POA: Diagnosis not present

## 2019-12-06 DIAGNOSIS — R222 Localized swelling, mass and lump, trunk: Secondary | ICD-10-CM | POA: Diagnosis present

## 2019-12-06 HISTORY — DX: Shortness of breath: R06.02

## 2019-12-06 LAB — CBC
HCT: 45.4 % (ref 39.0–52.0)
Hemoglobin: 15.5 g/dL (ref 13.0–17.0)
MCH: 28.9 pg (ref 26.0–34.0)
MCHC: 34.1 g/dL (ref 30.0–36.0)
MCV: 84.7 fL (ref 80.0–100.0)
Platelets: 261 10*3/uL (ref 150–400)
RBC: 5.36 MIL/uL (ref 4.22–5.81)
RDW: 12.2 % (ref 11.5–15.5)
WBC: 7.1 10*3/uL (ref 4.0–10.5)
nRBC: 0 % (ref 0.0–0.2)

## 2019-12-06 LAB — SURGICAL PCR SCREEN
MRSA, PCR: NEGATIVE
Staphylococcus aureus: POSITIVE — AB

## 2019-12-06 LAB — COMPREHENSIVE METABOLIC PANEL
ALT: 28 U/L (ref 0–44)
AST: 22 U/L (ref 15–41)
Albumin: 4.4 g/dL (ref 3.5–5.0)
Alkaline Phosphatase: 58 U/L (ref 38–126)
Anion gap: 11 (ref 5–15)
BUN: 13 mg/dL (ref 6–20)
CO2: 25 mmol/L (ref 22–32)
Calcium: 9.8 mg/dL (ref 8.9–10.3)
Chloride: 103 mmol/L (ref 98–111)
Creatinine, Ser: 0.71 mg/dL (ref 0.61–1.24)
GFR calc Af Amer: >60 mL/min (ref >60)
GFR calc non Af Amer: >60 mL/min (ref >60)
Glucose, Bld: 90 mg/dL (ref 70–99)
Potassium: 4.1 mmol/L (ref 3.5–5.1)
Sodium: 139 mmol/L (ref 135–145)
Total Bilirubin: 1.5 mg/dL — ABNORMAL HIGH (ref 0.3–1.2)
Total Protein: 7.4 g/dL (ref 6.5–8.1)

## 2019-12-06 LAB — PROTIME-INR
INR: 1 (ref 0.8–1.2)
Prothrombin Time: 13 seconds (ref 11.4–15.2)

## 2019-12-06 LAB — SARS CORONAVIRUS 2 (TAT 6-24 HRS): SARS Coronavirus 2: NEGATIVE

## 2019-12-06 LAB — APTT: aPTT: 36 seconds (ref 24–36)

## 2019-12-06 NOTE — Progress Notes (Signed)
PCP - denies Cardiologist -Dr. Cherlynn Kaiser   PPM/ICD - denies  Chest x-ray - 12/06/2019 EKG - 11/30/2019 Stress Test - deniess ECHO - 11/18/2019 Cardiac Cath - denies  Sleep Study - denies CPAP - N/A  DM: denies  Blood Thinner Instructions: N/A Aspirin Instructions:N/A  ERAS Protcol - No  COVID TEST- Scheduled for today 12/06/2019. Patient verbalized understanding of self-quarantine instructions, appointment time and place.  Anesthesia review: No  Patient denies shortness of breath, fever, cough and chest pain at PAT appointment  All instructions explained to the patient, with a verbal understanding of the material. Patient agrees to go over the instructions while at home for a better understanding. Patient also instructed to self quarantine after being tested for COVID-19. The opportunity to ask questions was provided.

## 2019-12-06 NOTE — Progress Notes (Signed)
Your procedure is scheduled on Friday, August 13th.  Report to [facility] Main Entrance "A" at 5:30 A.M., and check in at the Admitting office.  Call this number if you have problems the morning of surgery:  [phone]  Call [phone] if you have any questions prior to your surgery date Monday-Friday 8am-4pm   Remember:  Do not eat or drink after midnight the night before your surgery   Take these medicines the morning of surgery with A SIP OF WATER omeprazole (PRILOSEC)  As of today, STOP taking any Aspirin (unless otherwise instructed by your surgeon) Aleve, Naproxen, Ibuprofen, Motrin, Advil, Goody's, BC's, all herbal medications, fish oil, and all vitamins.                     Do not wear jewelry.            Do not wear lotions, powders, colognes, or deodorant.            Men may shave face and neck.            Do not bring valuables to the hospital.            [facility] is not responsible for any belongings or valuables.  Do NOT Smoke (Tobacco/Vaping) or drink Alcohol 24 hours prior to your procedure If you use a CPAP at night, you may bring all equipment for your overnight stay.   Contacts, glasses, dentures or bridgework may not be worn into surgery.      For patients admitted to the hospital, discharge time will be determined by your treatment team.   Patients discharged the day of surgery will not be allowed to drive home, and someone needs to stay with them for 24 hours.  Special instructions:   [place]- Preparing For Surgery  Before surgery, you can play an important role. Because skin is not sterile, your skin needs to be as free of germs as possible. You can reduce the number of germs on your skin by washing with CHG (chlorahexidine gluconate) Soap before surgery.  CHG is an antiseptic cleaner which kills germs and bonds with the skin to continue killing germs even after washing.    Oral Hygiene is also important to reduce your risk of infection.  Remember  - BRUSH YOUR TEETH THE MORNING OF SURGERY WITH YOUR REGULAR TOOTHPASTE  Please do not use if you have an allergy to CHG or antibacterial soaps. If your skin becomes reddened/irritated stop using the CHG.  Do not shave (including legs and underarms) for at least 48 hours prior to first CHG shower. It is OK to shave your face.  Please follow these instructions carefully.   1. Shower the NIGHT BEFORE SURGERY and the MORNING OF SURGERY with CHG Soap.   2. If you chose to wash your hair, wash your hair first as usual with your normal shampoo.  3. After you shampoo, rinse your hair and body thoroughly to remove the shampoo.  4. Use CHG as you would any other liquid soap. You can apply CHG directly to the skin and wash gently with a scrungie or a clean washcloth.   5. Apply the CHG Soap to your body ONLY FROM THE NECK DOWN.  Do not use on open wounds or open sores. Avoid contact with your eyes, ears, mouth and genitals (private parts). Wash Face and genitals (private parts)  with your normal soap.   6. Wash thoroughly, paying special attention to the area  where your surgery will be performed.  7. Thoroughly rinse your body with warm water from the neck down.  8. DO NOT shower/wash with your normal soap after using and rinsing off the CHG Soap.  9. Pat yourself dry with a CLEAN TOWEL.  10. Wear CLEAN PAJAMAS to bed the night before surgery  11. Place CLEAN SHEETS on your bed the night of your first shower and DO NOT SLEEP WITH PETS.  Day of Surgery: Wear Clean/Comfortable clothing the morning of surgery Do not apply any deodorants/lotions.   Remember to brush your teeth WITH YOUR REGULAR TOOTHPASTE.   Please read over the following fact sheets that you were given.

## 2019-12-07 ENCOUNTER — Ambulatory Visit
Admission: RE | Admit: 2019-12-07 | Discharge: 2019-12-07 | Disposition: A | Discharge status: Home / Self Care | Payer: BC Managed Care – PPO | Source: Ambulatory Visit

## 2019-12-08 ENCOUNTER — Ambulatory Visit (HOSPITAL_COMMUNITY): Payer: BC Managed Care – PPO | Admitting: Certified Registered Nurse Anesthetist

## 2019-12-08 ENCOUNTER — Encounter (HOSPITAL_COMMUNITY): Payer: Self-pay | Admitting: Thoracic Surgery (Cardiothoracic Vascular Surgery)

## 2019-12-08 ENCOUNTER — Encounter (HOSPITAL_COMMUNITY)
Admission: RE | Disposition: A | Discharge status: Home / Self Care | Payer: Self-pay | Source: Home / Self Care | Attending: Thoracic Surgery (Cardiothoracic Vascular Surgery)

## 2019-12-08 ENCOUNTER — Ambulatory Visit (HOSPITAL_COMMUNITY)
Admission: RE | Admit: 2019-12-08 | Discharge: 2019-12-08 | Disposition: A | Discharge status: Home / Self Care | Payer: BC Managed Care – PPO | Attending: Thoracic Surgery (Cardiothoracic Vascular Surgery) | Admitting: Thoracic Surgery (Cardiothoracic Vascular Surgery)

## 2019-12-08 ENCOUNTER — Other Ambulatory Visit: Payer: BC Managed Care – PPO

## 2019-12-08 ENCOUNTER — Other Ambulatory Visit: Payer: Self-pay

## 2019-12-08 ENCOUNTER — Ambulatory Visit: Payer: BC Managed Care – PPO | Admitting: Hematology and Oncology

## 2019-12-08 DIAGNOSIS — Z87891 Personal history of nicotine dependence: Secondary | ICD-10-CM | POA: Diagnosis not present

## 2019-12-08 DIAGNOSIS — R590 Localized enlarged lymph nodes: Secondary | ICD-10-CM | POA: Diagnosis not present

## 2019-12-08 DIAGNOSIS — Z8709 Personal history of other diseases of the respiratory system: Secondary | ICD-10-CM | POA: Diagnosis not present

## 2019-12-08 DIAGNOSIS — C8192 Hodgkin lymphoma, unspecified, intrathoracic lymph nodes: Secondary | ICD-10-CM | POA: Insufficient documentation

## 2019-12-08 DIAGNOSIS — Z79899 Other long term (current) drug therapy: Secondary | ICD-10-CM | POA: Diagnosis not present

## 2019-12-08 DIAGNOSIS — Z9049 Acquired absence of other specified parts of digestive tract: Secondary | ICD-10-CM | POA: Diagnosis not present

## 2019-12-08 DIAGNOSIS — R222 Localized swelling, mass and lump, trunk: Secondary | ICD-10-CM | POA: Diagnosis not present

## 2019-12-08 DIAGNOSIS — F909 Attention-deficit hyperactivity disorder, unspecified type: Secondary | ICD-10-CM | POA: Diagnosis not present

## 2019-12-08 DIAGNOSIS — I309 Acute pericarditis, unspecified: Secondary | ICD-10-CM | POA: Diagnosis not present

## 2019-12-08 HISTORY — PX: MEDIASTINOSCOPY: SHX5086

## 2019-12-08 LAB — PREPARE RBC (CROSSMATCH)

## 2019-12-08 LAB — ABO/RH: ABO/RH(D): O POS

## 2019-12-08 SURGERY — MEDIASTINOSCOPY
Anesthesia: General

## 2019-12-08 MED ORDER — PROPOFOL 10 MG/ML IV BOLUS
INTRAVENOUS | Status: AC
  Filled 2019-12-08: qty 20

## 2019-12-08 MED ORDER — CHLORHEXIDINE GLUCONATE 0.12 % MT SOLN
OROMUCOSAL | Status: AC
  Administered 2019-12-08: 15 mL via OROMUCOSAL
  Filled 2019-12-08: qty 15

## 2019-12-08 MED ORDER — PROPOFOL 10 MG/ML IV BOLUS
INTRAVENOUS | Status: DC | PRN
  Administered 2019-12-08: 200 mg via INTRAVENOUS

## 2019-12-08 MED ORDER — AMISULPRIDE (ANTIEMETIC) 5 MG/2ML IV SOLN: 10.0000 mg | Freq: Once | INTRAVENOUS | Status: DC | PRN

## 2019-12-08 MED ORDER — MIDAZOLAM HCL 2 MG/2ML IJ SOLN
INTRAMUSCULAR | Status: DC | PRN
  Administered 2019-12-08: 2 mg via INTRAVENOUS

## 2019-12-08 MED ORDER — HYDROMORPHONE HCL 1 MG/ML IJ SOLN
0.2500 mg | INTRAMUSCULAR | Status: DC | PRN
  Administered 2019-12-08: 1 mg via INTRAVENOUS
  Administered 2019-12-08 (×2): 0.5 mg via INTRAVENOUS

## 2019-12-08 MED ORDER — DEXMEDETOMIDINE (PRECEDEX) IN NS 20 MCG/5ML (4 MCG/ML) IV SYRINGE
PREFILLED_SYRINGE | INTRAVENOUS | Status: AC
  Filled 2019-12-08: qty 5

## 2019-12-08 MED ORDER — LIDOCAINE 2% (20 MG/ML) 5 ML SYRINGE
INTRAMUSCULAR | Status: AC
  Filled 2019-12-08: qty 5

## 2019-12-08 MED ORDER — ONDANSETRON HCL 4 MG/2ML IJ SOLN
INTRAMUSCULAR | Status: AC
  Filled 2019-12-08: qty 2

## 2019-12-08 MED ORDER — CHLORHEXIDINE GLUCONATE 4 % EX LIQD: 60.0000 mL | Freq: Once | CUTANEOUS | Status: DC

## 2019-12-08 MED ORDER — LACTATED RINGERS IV SOLN: INTRAVENOUS | Status: DC | PRN

## 2019-12-08 MED ORDER — ONDANSETRON HCL 4 MG/2ML IJ SOLN: 4.0000 mg | Freq: Once | INTRAMUSCULAR | Status: DC | PRN

## 2019-12-08 MED ORDER — HYDROMORPHONE HCL 1 MG/ML IJ SOLN
INTRAMUSCULAR | Status: AC
  Filled 2019-12-08: qty 1

## 2019-12-08 MED ORDER — LACTATED RINGERS IV SOLN: INTRAVENOUS | Status: DC

## 2019-12-08 MED ORDER — FENTANYL CITRATE (PF) 250 MCG/5ML IJ SOLN
INTRAMUSCULAR | Status: DC | PRN
  Administered 2019-12-08 (×3): 50 ug via INTRAVENOUS
  Administered 2019-12-08: 100 ug via INTRAVENOUS

## 2019-12-08 MED ORDER — OXYCODONE HCL 5 MG PO TABS
5.0000 mg | ORAL_TABLET | Freq: Once | ORAL | Status: AC | PRN
  Administered 2019-12-08: 5 mg via ORAL

## 2019-12-08 MED ORDER — DEXMEDETOMIDINE (PRECEDEX) IN NS 20 MCG/5ML (4 MCG/ML) IV SYRINGE
PREFILLED_SYRINGE | INTRAVENOUS | Status: DC | PRN
  Administered 2019-12-08: 20 ug via INTRAVENOUS

## 2019-12-08 MED ORDER — CHLORHEXIDINE GLUCONATE 0.12 % MT SOLN: 15.0000 mL | Freq: Once | OROMUCOSAL | Status: AC

## 2019-12-08 MED ORDER — OXYCODONE HCL 5 MG PO TABS: 5.0000 mg | ORAL_TABLET | Freq: Four times a day (QID) | ORAL | Status: DC | PRN

## 2019-12-08 MED ORDER — LIDOCAINE 2% (20 MG/ML) 5 ML SYRINGE
INTRAMUSCULAR | Status: DC | PRN
  Administered 2019-12-08: 60 mg via INTRAVENOUS

## 2019-12-08 MED ORDER — ORAL CARE MOUTH RINSE: 15.0000 mL | Freq: Once | OROMUCOSAL | Status: AC

## 2019-12-08 MED ORDER — ROCURONIUM BROMIDE 10 MG/ML (PF) SYRINGE
PREFILLED_SYRINGE | INTRAVENOUS | Status: DC | PRN
  Administered 2019-12-08: 30 mg via INTRAVENOUS
  Administered 2019-12-08: 50 mg via INTRAVENOUS

## 2019-12-08 MED ORDER — ONDANSETRON HCL 4 MG/2ML IJ SOLN
INTRAMUSCULAR | Status: DC | PRN
  Administered 2019-12-08: 4 mg via INTRAVENOUS

## 2019-12-08 MED ORDER — 0.9 % SODIUM CHLORIDE (POUR BTL) OPTIME
TOPICAL | Status: DC | PRN
  Administered 2019-12-08: 1000 mL

## 2019-12-08 MED ORDER — OXYCODONE HCL 5 MG PO TABS
ORAL_TABLET | ORAL | Status: AC
  Filled 2019-12-08: qty 1

## 2019-12-08 MED ORDER — MIDAZOLAM HCL 2 MG/2ML IJ SOLN
INTRAMUSCULAR | Status: AC
  Filled 2019-12-08: qty 2

## 2019-12-08 MED ORDER — SUGAMMADEX SODIUM 200 MG/2ML IV SOLN
INTRAVENOUS | Status: DC | PRN
  Administered 2019-12-08: 200 mg via INTRAVENOUS
  Administered 2019-12-08: 100 mg via INTRAVENOUS

## 2019-12-08 MED ORDER — FENTANYL CITRATE (PF) 250 MCG/5ML IJ SOLN
INTRAMUSCULAR | Status: AC
  Filled 2019-12-08: qty 5

## 2019-12-08 MED ORDER — OXYCODONE HCL 5 MG PO TABS: 5.0000 mg | ORAL_TABLET | Freq: Four times a day (QID) | ORAL | 0 refills | Status: DC | PRN

## 2019-12-08 MED ORDER — OXYCODONE HCL 5 MG/5ML PO SOLN: 5.0000 mg | Freq: Once | ORAL | Status: AC | PRN

## 2019-12-08 MED ORDER — CEFAZOLIN SODIUM-DEXTROSE 2-4 GM/100ML-% IV SOLN
2.0000 g | INTRAVENOUS | Status: AC
  Administered 2019-12-08: 2 g via INTRAVENOUS

## 2019-12-08 MED ORDER — DEXAMETHASONE SODIUM PHOSPHATE 10 MG/ML IJ SOLN
INTRAMUSCULAR | Status: AC
  Filled 2019-12-08: qty 1

## 2019-12-08 MED ORDER — DEXAMETHASONE SODIUM PHOSPHATE 10 MG/ML IJ SOLN
INTRAMUSCULAR | Status: DC | PRN
  Administered 2019-12-08: 10 mg via INTRAVENOUS

## 2019-12-08 MED ORDER — ROCURONIUM BROMIDE 10 MG/ML (PF) SYRINGE
PREFILLED_SYRINGE | INTRAVENOUS | Status: AC
  Filled 2019-12-08: qty 10

## 2019-12-08 MED ORDER — CEFAZOLIN SODIUM-DEXTROSE 2-4 GM/100ML-% IV SOLN
INTRAVENOUS | Status: AC
  Filled 2019-12-08: qty 100

## 2019-12-08 SURGICAL SUPPLY — 40 items
ADH SKN CLS APL DERMABOND .7 (GAUZE/BANDAGES/DRESSINGS) ×1
APPLIER CLIP LOGIC TI 5 (MISCELLANEOUS) IMPLANT
APR CLP MED LRG 33X5 (MISCELLANEOUS)
BLADE CLIPPER SURG (BLADE) ×1 IMPLANT
BLADE SURG 15 STRL LF DISP TIS (BLADE) ×1 IMPLANT
BLADE SURG 15 STRL SS (BLADE) ×2
CANISTER SUCT 3000ML PPV (MISCELLANEOUS) ×2 IMPLANT
CLIP VESOCCLUDE MED 6/CT (CLIP) ×2 IMPLANT
CNTNR URN SCR LID CUP LEK RST (MISCELLANEOUS) ×2 IMPLANT
CONT SPEC 4OZ STRL OR WHT (MISCELLANEOUS) ×6
COVER SURGICAL LIGHT HANDLE (MISCELLANEOUS) ×3 IMPLANT
DERMABOND ADVANCED (GAUZE/BANDAGES/DRESSINGS) ×1
DERMABOND ADVANCED .7 DNX12 (GAUZE/BANDAGES/DRESSINGS) ×1 IMPLANT
DRAPE CHEST BREAST 15X10 FENES (DRAPES) ×2 IMPLANT
ELECT REM PT RETURN 9FT ADLT (ELECTROSURGICAL) ×2
ELECTRODE REM PT RTRN 9FT ADLT (ELECTROSURGICAL) ×1 IMPLANT
GAUZE 4X4 16PLY RFD (DISPOSABLE) ×2 IMPLANT
GLOVE TRIUMPH SURG SIZE 7.5 (KITS) ×1 IMPLANT
GOWN STRL REUS W/ TWL LRG LVL3 (GOWN DISPOSABLE) ×1 IMPLANT
GOWN STRL REUS W/ TWL XL LVL3 (GOWN DISPOSABLE) ×1 IMPLANT
GOWN STRL REUS W/TWL LRG LVL3 (GOWN DISPOSABLE) ×4
GOWN STRL REUS W/TWL XL LVL3 (GOWN DISPOSABLE) ×2
HEMOSTAT SURGICEL 2X14 (HEMOSTASIS) IMPLANT
KIT BASIN OR (CUSTOM PROCEDURE TRAY) ×2 IMPLANT
KIT TURNOVER KIT B (KITS) ×2 IMPLANT
NS IRRIG 1000ML POUR BTL (IV SOLUTION) ×2 IMPLANT
PACK GENERAL/GYN (CUSTOM PROCEDURE TRAY) ×2 IMPLANT
PAD ARMBOARD 7.5X6 YLW CONV (MISCELLANEOUS) ×4 IMPLANT
PENCIL BUTTON HOLSTER BLD 10FT (ELECTRODE) ×2 IMPLANT
SPONGE INTESTINAL PEANUT (DISPOSABLE) IMPLANT
SUT SILK 2 0 (SUTURE)
SUT SILK 2-0 18XBRD TIE 12 (SUTURE) IMPLANT
SUT VIC AB 2-0 CT1 27 (SUTURE) ×2
SUT VIC AB 2-0 CT1 TAPERPNT 27 (SUTURE) IMPLANT
SUT VIC AB 3-0 SH 18 (SUTURE) ×2 IMPLANT
SUT VICRYL 4-0 PS2 18IN ABS (SUTURE) ×2 IMPLANT
SYR 10ML LL (SYRINGE) ×2 IMPLANT
TOWEL GREEN STERILE (TOWEL DISPOSABLE) ×2 IMPLANT
TOWEL GREEN STERILE FF (TOWEL DISPOSABLE) ×2 IMPLANT
WATER STERILE IRR 1000ML POUR (IV SOLUTION) ×2 IMPLANT

## 2019-12-08 NOTE — Op Note (Signed)
NAME: Martin Spencer, Martin Spencer MEDICAL RECORD [id number] ACCOUNT [id number] DATE OF BIRTH:29-Jun-1990 FACILITY: [facility] LOCATION: [facility] PHYSICIAN:Pride Gonzales Chaya Jan, MD  OPERATIVE REPORT  DATE OF PROCEDURE:  12/08/2019  PREOPERATIVE DIAGNOSIS:  Anterior mediastinal mass and mediastinal adenopathy.  POSTOPERATIVE DIAGNOSIS:  Anterior mediastinal mass and mediastinal adenopathy.  PROCEDURE:  Mediastinoscopy.  SURGEON:  Modesto Charon, MD  ASSISTANT:  None.  ANESTHESIA:  General.  FINDINGS:  Enlarged, firm, white right paratracheal mass just inferior to the innominate artery.  Frozen section showed lymphoid cells.  No carcinoma seen.  CLINICAL REPORT:  Martin Spencer is a 29 year old young man who presented with chest pain and was found to have pericarditis.  He had a CT angiogram to rule out a pulmonary embolus and was found to have an anterior mediastinal mass and right paratracheal  adenopathy.  On PET CT, these areas were markedly hypermetabolic.  He underwent endobronchial ultrasound, which was nondiagnostic and now is referred for surgical biopsy.  The indications, risks, benefits, and alternatives were discussed in detail with  the patient.  He understood and accepted the risks and agreed to proceed.  OPERATIVE NOTE:  Martin Spencer was brought to the operating room on 12/08/2019.  He had induction of general anesthesia.  Intravenous antibiotics were administered.  Sequential compression devices were placed on the calves for DVT prophylaxis.  The neck  and chest were prepped and draped in the usual sterile fashion.  A timeout was performed.  A transverse incision was made 1 fingerbreadth above the sternal notch.  It was carried through the skin and subcutaneous tissue.  The strap muscles were separated in the midline.  The pretracheal fascia was identified and  incised and the pretracheal plane was developed bluntly into the mediastinum.  There was a palpable mass just inferior  to the innominate artery on the right side.  The mediastinoscope was inserted and blunt dissection and cautery were used to expose the  right paratracheal node.  There was an inflammatory reaction around this.  Biopsies were taken and sent for frozen section.  Numerous additional biopsies were taken for permanent pathology and flow cytometry.  There was minimal bleeding with the  biopsies.  After the biopsies were performed, the wound was packed with gauze.  The frozen section returned showing numerous lymphoid cells.  It was not a carcinoma, but could not rule out lymphoma.  The packing was removed.  The mediastinoscope was  reinserted.  There was no significant bleeding.  The mediastinoscope was withdrawn.  The wound was closed in 2 layers with 3-0 Vicryl subcutaneous sutures and a 4-0 Vicryl subcuticular suture.  Dermabond was applied.  The patient was extubated in the  operating room and taken to the [facility] in good condition.  VN/NUANCE  D:12/08/2019 T:12/08/2019 JOB:[id number]

## 2019-12-08 NOTE — Anesthesia Postprocedure Evaluation (Signed)
Anesthesia Post Note  Patient: Martin Spencer  Procedure(s) Performed: MEDIASTINOSCOPY (N/A )     Patient location during evaluation: PACU Anesthesia Type: General Level of consciousness: awake and alert Pain management: pain level controlled Vital Signs Assessment: post-procedure vital signs reviewed and stable Respiratory status: spontaneous breathing, nonlabored ventilation and respiratory function stable Cardiovascular status: blood pressure returned to baseline and stable Postop Assessment: no apparent nausea or vomiting Anesthetic complications: no   No complications documented.  Last Vitals:  Vitals:   12/08/19 0945 12/08/19 1000  BP: 125/73 125/74  Pulse: 72 65  Resp: 14 13  Temp:  36.9 C  SpO2: 97% 96%    Last Pain:  Vitals:   12/08/19 1000  PainSc: 0-No pain                 Lidia Collum

## 2019-12-08 NOTE — Transfer of Care (Signed)
Immediate Anesthesia Transfer of Care Note  Patient: Martin Spencer  Procedure(s) Performed: MEDIASTINOSCOPY (N/A )  Patient Location: PACU  Anesthesia Type:General  Level of Consciousness: awake, alert  and patient cooperative  Airway & Oxygen Therapy: Patient Spontanous Breathing  Post-op Assessment: Report given to RN and Post -op Vital signs reviewed and stable  Post vital signs: Reviewed and stable  Last Vitals:  Vitals Value Taken Time  BP 124/73 12/08/19 0920  Temp 36.8 C 12/08/19 0919  Pulse 74 12/08/19 0921  Resp 16 12/08/19 0921  SpO2 99 % 12/08/19 0921  Vitals shown include unvalidated device data.  Last Pain:  Vitals:   12/08/19 0616  PainSc: 3       Patients Stated Pain Goal: 2 (35/[id number])  Complications: No complications documented.

## 2019-12-08 NOTE — Brief Op Note (Signed)
12/08/2019  9:36 AM  PATIENT:  Martin Spencer  29 y.o. male  PRE-OPERATIVE DIAGNOSIS:  Mediastinal adenopathy  POST-OPERATIVE DIAGNOSIS:  Mediastinal adenopathy  PROCEDURE:  Procedure(s): MEDIASTINOSCOPY (N/A)  SURGEON:  Surgeon(s) and Role:    * Melrose Nakayama, MD - Primary  PHYSICIAN ASSISTANT:   ASSISTANTS: none   ANESTHESIA:   general  EBL:  25 mL   BLOOD ADMINISTERED:none  DRAINS: none   LOCAL MEDICATIONS USED:  NONE  SPECIMEN:  Source of Specimen:  mediastinal node  DISPOSITION OF SPECIMEN:  PATHOLOGY  COUNTS:  YES  TOURNIQUET:  * No tourniquets in log *  DICTATION: .Other Dictation: Dictation Number -  PLAN OF CARE: Discharge to home after PACU  PATIENT DISPOSITION:  PACU - hemodynamically stable.   Delay start of Pharmacological VTE agent (>24hrs) due to surgical blood loss or risk of bleeding: not applicable

## 2019-12-08 NOTE — Anesthesia Preprocedure Evaluation (Addendum)
Anesthesia Evaluation  Patient identified by MRN, date of birth, ID band Patient awake    Reviewed: Allergy & Precautions, NPO status , Patient's Chart, lab work & pertinent test results  History of Anesthesia Complications Negative for: history of anesthetic complications  Airway Mallampati: I  TM Distance: >3 FB Neck ROM: Full    Dental  (+) Teeth Intact   Pulmonary neg pulmonary ROS, former smoker,    Pulmonary exam normal        Cardiovascular Normal cardiovascular exam  Pericarditis Mediastinal lymphadenopathy   Neuro/Psych negative neurological ROS  negative psych ROS   GI/Hepatic negative GI ROS, Neg liver ROS,   Endo/Other  negative endocrine ROS  Renal/GU negative Renal ROS  negative genitourinary   Musculoskeletal negative musculoskeletal ROS (+)   Abdominal   Peds  (+) ATTENTION DEFICIT DISORDER WITHOUT HYPERACTIVITY Hematology negative hematology ROS (+)   Anesthesia Other Findings   Reproductive/Obstetrics                           Anesthesia Physical Anesthesia Plan  ASA: III  Anesthesia Plan: General   Post-op Pain Management:    Induction: Intravenous  PONV Risk Score and Plan: 2 and Ondansetron, Dexamethasone, Treatment may vary due to age or medical condition and Midazolam  Airway Management Planned: Oral ETT  Additional Equipment: None  Intra-op Plan:   Post-operative Plan: Extubation in OR  Informed Consent: I have reviewed the patients History and Physical, chart, labs and discussed the procedure including the risks, benefits and alternatives for the proposed anesthesia with the patient or authorized representative who has indicated his/her understanding and acceptance.     Dental advisory given  Plan Discussed with:   Anesthesia Plan Comments:        Anesthesia Quick Evaluation

## 2019-12-08 NOTE — Discharge Instructions (Signed)
Follow up with Dr. Lorenso Courier as scheduled.  You have a prescription for a narcotic pain medication, oxycodone. You may use as directed. You may use acetaminophen (Tylenol) or ibuprofen (Advil, Motrin) in addition to or instead of the oxycodone.  Do not drive this weekend. You may begin driving Monday if you are not having pain. Do not drive within 4 hours of taking oxycodone.  There is a medical adhesive over the incision. It will begin to peel off in about 2 weeks.  You may shower tomorrow.  Avoid heavy physical activity for 1 week.  Call [phone] if you develop chest pain, shortness of breath, fever > 101 F or notice excessive pain, swelling, redness or drainage from the incision.  My office will contact you with a follow up appointment.

## 2019-12-08 NOTE — Anesthesia Procedure Notes (Signed)
Procedure Name: Intubation Date/Time: 12/08/2019 7:38 AM Performed by: Janace Litten, CRNA Pre-anesthesia Checklist: Patient identified, Emergency Drugs available, Suction available and Patient being monitored Patient Re-evaluated:Patient Re-evaluated prior to induction Oxygen Delivery Method: Circle System Utilized Preoxygenation: Pre-oxygenation with 100% oxygen Induction Type: IV induction Ventilation: Mask ventilation without difficulty Laryngoscope Size: Mac and 4 Grade View: Grade I Tube type: Oral Tube size: 7.5 mm Number of attempts: 1 Airway Equipment and Method: Stylet Placement Confirmation: ETT inserted through vocal cords under direct vision,  positive ETCO2 and breath sounds checked- equal and bilateral Secured at: 23 cm Tube secured with: Tape Dental Injury: Teeth and Oropharynx as per pre-operative assessment

## 2019-12-08 NOTE — Interval H&P Note (Signed)
History and Physical Interval Note:  12/08/2019 7:17 AM  Martin Spencer  has presented today for surgery, with the diagnosis of Mediastinal adenopathy.  The various methods of treatment have been discussed with the patient and family. After consideration of risks, benefits and other options for treatment, the patient has consented to  Procedure(s): MEDIASTINOSCOPY (N/A) as a surgical intervention.  The patient's history has been reviewed, patient examined, no change in status, stable for surgery.  I have reviewed the patient's chart and labs.  Questions were answered to the patient's satisfaction.     Melrose Nakayama

## 2019-12-09 ENCOUNTER — Encounter (HOSPITAL_COMMUNITY): Payer: Self-pay | Admitting: Thoracic Surgery (Cardiothoracic Vascular Surgery)

## 2019-12-11 ENCOUNTER — Encounter (HOSPITAL_COMMUNITY): Payer: Self-pay | Admitting: Thoracic Surgery (Cardiothoracic Vascular Surgery)

## 2019-12-11 LAB — TYPE AND SCREEN
ABO/RH(D): O POS
Antibody Screen: NEGATIVE
Unit division: 0
Unit division: 0

## 2019-12-11 LAB — BPAM RBC
Blood Product Expiration Date: 202109122359
Blood Product Expiration Date: 202109122359
ISSUE DATE / TIME: 202108130015
ISSUE DATE / TIME: 202108130015
Unit Type and Rh: 5100
Unit Type and Rh: 5100

## 2019-12-13 ENCOUNTER — Telehealth: Payer: Self-pay | Admitting: *Deleted

## 2019-12-13 NOTE — Telephone Encounter (Signed)
Received call from patient inquiring about pathology results from his biopsy on 12/08/19. Advised that the results aren't in yet. Advised that it can sometimes take up to a week for results. Advised that Dr. Lorenso Courier will likely contact him on 12/18/19 with results as it would have been a bit more than a week at that time.  Akari voiced understanding.

## 2019-12-14 LAB — SURGICAL PATHOLOGY

## 2019-12-15 ENCOUNTER — Telehealth: Payer: Self-pay | Admitting: Hematology and Oncology

## 2019-12-15 NOTE — Telephone Encounter (Signed)
R/s 8/25 appt per sch msg. Called and spoke with patient. Confirmed new date and time

## 2019-12-18 ENCOUNTER — Encounter: Payer: Self-pay | Admitting: Hematology and Oncology

## 2019-12-18 ENCOUNTER — Inpatient Hospital Stay: Payer: BC Managed Care – PPO

## 2019-12-18 ENCOUNTER — Other Ambulatory Visit: Payer: Self-pay

## 2019-12-18 ENCOUNTER — Inpatient Hospital Stay: Payer: BC Managed Care – PPO | Attending: Hematology and Oncology | Admitting: Hematology and Oncology

## 2019-12-18 ENCOUNTER — Telehealth: Payer: Self-pay | Admitting: Hematology and Oncology

## 2019-12-18 ENCOUNTER — Telehealth: Payer: Self-pay | Admitting: *Deleted

## 2019-12-18 VITALS — BP 126/60 | HR 80 | Temp 96.9°F | Resp 18 | Ht 71.0 in | Wt 179.0 lb

## 2019-12-18 DIAGNOSIS — C8112 Nodular sclerosis classical Hodgkin lymphoma, intrathoracic lymph nodes: Secondary | ICD-10-CM

## 2019-12-18 DIAGNOSIS — Z87891 Personal history of nicotine dependence: Secondary | ICD-10-CM | POA: Insufficient documentation

## 2019-12-18 DIAGNOSIS — R590 Localized enlarged lymph nodes: Secondary | ICD-10-CM | POA: Diagnosis not present

## 2019-12-18 NOTE — Telephone Encounter (Signed)
TCT patient with appts for port placement and pulmonary function tests.  Spoke with pt and advised that he has port placement scheduled for 12/26/19 @ 1pm . Advised of NPO status after 8am and to bring a driver with him.  Also informed him of appt @ [facility] tomorrow, 12/19/19 @ 1pm for PFTs. Advised he needs to bring his vaccination card with him as proof of Covid vaccines being done. Martin Spencer voiced understanding. He is very appreciative of today's information.  He knows he still has to have Chemo Ed . This will be this week or next.

## 2019-12-18 NOTE — Progress Notes (Signed)
START ON PATHWAY REGIMEN - Lymphoma and CLL     A cycle is every 28 days:     Bleomycin      Dacarbazine      Doxorubicin      Vinblastine   **Always confirm dose/schedule in your pharmacy ordering system**  Patient Characteristics: Classical Hodgkin Lymphoma, First Line, Stage I / II, Early Favorable with  No Risk Factors  and No Bulk, Age < 101 Disease Type: Not Applicable Disease Type: Not Applicable Disease Type: Classical Hodgkin Lymphoma Line of therapy: First Line First Line, Stage I/II Disease Characteristics: Early Favorable with No Risk Factors and No Bulk Age: < 69 Intent of Therapy: Curative Intent, Discussed with Patient

## 2019-12-18 NOTE — Telephone Encounter (Signed)
Scheduled appt per 8/23 sch msg - pt is aware of appt date and time

## 2019-12-18 NOTE — Progress Notes (Signed)
[place] [phone]   [phone]  PROGRESS NOTE  Patient Care Team: Patient, No Pcp Per as PCP - General (General Practice) Elouise Munroe, MD as PCP - Cardiology (Cardiology)  Hematological/Oncological History # Classical Hodgkin Lymphoma, Stage I.  Favorable Disease 1) 11/17/2019: presented to the emergency department with chest pain. CXR performed showed right paratracheal density. CT recommended. CT angiogram showed diffuse enlargement of the thymus gland with associated mild mediastinal adenopathy including right paratracheal enlarged lymph node corresponding to radiographic abnormality 2) 11/19/2019: CT abdomen performed showing subtle mass versus steatosis within the caudate lobe of liver; due to history of thoracic adenopathy and thymic infiltration, follow-up non emergent MR imaging of the liver with and without contrast recommended to exclude hepatic mass 3) 11/24/2019: PET CT scan performed showing hypermetabolic mediastinal adenopathy, consistent with lymphoma. ([place]) 5. No extrathoracic disease 4) 11/30/2019: EBUS performed, failed to provide adequate tissue for diagnosis 5) 12/08/2019: mediastinoscopy performed, biopsy confirmed Hodgkin's lymphoma.   Interval History:  Martin Spencer 29 y.o. male with medical history significant for classical Hodgkin Lymphoma who presents for a follow up visit. The patient was last on 11/24/2019 after his PET CT scan. He presents today to discuss the biopsy results.   On exam today Martin Spencer is accompanied by his mother and father.  He reports that his chest pain has resolved, he is still taking colchicine and NSAID therapy.  He reports that he does have some occasional pain in the back but that his biopsy site with the mediastinoscopy is healing well.  He notes that his weight is stable and he has had no the symptom such as fevers, chills, sweats, nausea, vomiting or diarrhea.  Overall he reports  that his health is stable and he is able to pursue his daily activities of living without any difficulty.  He has not had any issues with fatigue or energy level.  A full 10 point ROS is listed below.  The bulk of today's discussion was focused on the else of the biopsy, the diagnosis, and the treatment which are detailed below.  MEDICAL HISTORY:  Past Medical History:  Diagnosis Date  . ADD (attention deficit disorder)   . Cancer ([place])   . Childhood asthma   . Pericarditis   . Shortness of breath    per patient, since recent diagnoses of pericarditis, sometimes get SOB on rest    SURGICAL HISTORY: Past Surgical History:  Procedure Laterality Date  . APPENDECTOMY    . FINE NEEDLE ASPIRATION  11/30/2019   Procedure: FINE NEEDLE ASPIRATION (FNA) LINEAR;  Surgeon: Candee Furbish, MD;  Location: [facility] ENDOSCOPY;  Service: Pulmonary;;  . LAPAROSCOPIC APPENDECTOMY N/A 11/14/2017   Procedure: APPENDECTOMY LAPAROSCOPIC;  Surgeon: Georganna Skeans, MD;  Location: [place];  Service: General;  Laterality: N/A;  . MEDIASTINOSCOPY N/A 12/08/2019   Procedure: MEDIASTINOSCOPY;  Surgeon: Melrose Nakayama, MD;  Location: [place];  Service: Thoracic;  Laterality: N/A;  . VIDEO BRONCHOSCOPY WITH ENDOBRONCHIAL ULTRASOUND N/A 11/30/2019   Procedure: VIDEO BRONCHOSCOPY WITH ENDOBRONCHIAL ULTRASOUND;  Surgeon: Candee Furbish, MD;  Location: [facility] ENDOSCOPY;  Service: Pulmonary;  Laterality: N/A;  . WISDOM TOOTH EXTRACTION      SOCIAL HISTORY: Social History   Socioeconomic History  . Marital status: Single    Spouse name: Not on file  . Number of children: Not on file  . Years of education: Not on file  . Highest education level: Not on file  Occupational History  .  Not on file  Tobacco Use  . Smoking status: Former Smoker    Types: Cigarettes  . Smokeless tobacco: Never Used  . Tobacco comment: quit in high school  Vaping Use  . Vaping Use: Never used  Substance and Sexual Activity  . Alcohol use:  Yes    Comment: per patient "rarely"  . Drug use: Not Currently    Types: Marijuana    Comment: CBD last use 2 weeks ago ( 11/29/19)  . Sexual activity: Not on file  Other Topics Concern  . Not on file  Social History Narrative  . Not on file   Social Determinants of Health   Financial Resource Strain:   . Difficulty of Paying Living Expenses: Not on file  Food Insecurity:   . Worried About Charity fundraiser in the Last Year: Not on file  . Ran Out of Food in the Last Year: Not on file  Transportation Needs:   . Lack of Transportation (Medical): Not on file  . Lack of Transportation (Non-Medical): Not on file  Physical Activity:   . Days of Exercise per Week: Not on file  . Minutes of Exercise per Session: Not on file  Stress:   . Feeling of Stress : Not on file  Social Connections:   . Frequency of Communication with Friends and Family: Not on file  . Frequency of Social Gatherings with Friends and Family: Not on file  . Attends Religious Services: Not on file  . Active Member of Clubs or Organizations: Not on file  . Attends Archivist Meetings: Not on file  . Marital Status: Not on file  Intimate Partner Violence:   . Fear of Current or Ex-Partner: Not on file  . Emotionally Abused: Not on file  . Physically Abused: Not on file  . Sexually Abused: Not on file    FAMILY HISTORY: Family History  Problem Relation Age of Onset  . Kidney cancer Father   . Colon cancer Paternal Grandfather     ALLERGIES:  is allergic to other.  MEDICATIONS:  Current Outpatient Medications  Medication Sig Dispense Refill  . colchicine 0.6 MG tablet Take 1 tablet (0.6 mg total) by mouth 2 (two) times daily. (Patient not taking: Reported on 12/18/2019) 60 tablet 2  . ibuprofen (ADVIL) 800 MG tablet Take 1 tablet (800 mg total) by mouth every 8 (eight) hours. 90 tablet 0  . omeprazole (PRILOSEC) 40 MG capsule Take 1 capsule (40 mg total) by mouth daily. 30 capsule 0  .  oxyCODONE (OXY IR/ROXICODONE) 5 MG immediate release tablet Take 1-2 tablets (5-10 mg total) by mouth every 6 (six) hours as needed for moderate pain or severe pain. (Patient not taking: Reported on 12/18/2019) 30 tablet 0   No current facility-administered medications for this visit.    REVIEW OF SYSTEMS:   Constitutional: ( - ) fevers, ( - )  chills , ( - ) night sweats Eyes: ( - ) blurriness of vision, ( - ) double vision, ( - ) watery eyes Ears, nose, mouth, throat, and face: ( - ) mucositis, ( - ) sore throat Respiratory: ( - ) cough, ( - ) dyspnea, ( - ) wheezes Cardiovascular: ( - ) palpitation, ( - ) chest discomfort, ( - ) lower extremity swelling Gastrointestinal:  ( - ) nausea, ( - ) heartburn, ( - ) change in bowel habits Skin: ( - ) abnormal skin rashes Lymphatics: ( - ) new lymphadenopathy, ( - )  easy bruising Neurological: ( - ) numbness, ( - ) tingling, ( - ) new weaknesses Behavioral/Psych: ( - ) mood change, ( - ) new changes  All other systems were reviewed with the patient and are negative.  PHYSICAL EXAMINATION: ECOG PERFORMANCE STATUS: 0 - Asymptomatic  Vitals:   12/18/19 1104  BP: 126/60  Pulse: 80  Resp: 18  Temp: (!) 96.9 F (36.1 C)  SpO2: 100%   Filed Weights   12/18/19 1104  Weight: 179 lb (81.2 kg)    GENERAL: well appearing fit young Caucasian male. alert, no distress and comfortable SKIN: skin color, texture, turgor are normal, no rashes or significant lesions. Small surgical incision at base of neck, well healing.  EYES: conjunctiva are pink and non-injected, sclera clear LUNGS: clear to auscultation and percussion with normal breathing effort HEART: regular rate & rhythm and no murmurs and no lower extremity edema Musculoskeletal: no cyanosis of digits and no clubbing  PSYCH: alert & oriented x 3, fluent speech NEURO: no focal motor/sensory deficits  LABORATORY DATA:  I have reviewed the data as listed CBC Latest Ref Rng & Units 12/06/2019  11/19/2019 11/18/2019  WBC 4.0 - 10.5 K/uL 7.1 7.1 12.5(H)  Hemoglobin 13.0 - 17.0 g/dL 15.5 12.7(L) 14.0  Hematocrit 39 - 52 % 45.4 38.5(L) 40.3  Platelets 150 - 400 K/uL 261 215 269    CMP Latest Ref Rng & Units 12/06/2019 11/30/2019 11/18/2019  Glucose 70 - 99 mg/dL 90 83 213(H)  BUN 6 - 20 mg/dL 13 14 10   Creatinine 0.61 - 1.24 mg/dL 0.71 0.85 0.80  Sodium 135 - 145 mmol/L 139 137 137  Potassium 3.5 - 5.1 mmol/L 4.1 5.2(H) 3.9  Chloride 98 - 111 mmol/L 103 104 105  CO2 22 - 32 mmol/L 25 23 23   Calcium 8.9 - 10.3 mg/dL 9.8 9.5 9.4  Total Protein 6.5 - 8.1 g/dL 7.4 7.2 6.9  Total Bilirubin 0.3 - 1.2 mg/dL 1.5(H) 1.8(H) 1.2  Alkaline Phos 38 - 126 U/L 58 51 53  AST 15 - 41 U/L 22 45(H) 15  ALT 0 - 44 U/L 28 20 20     RADIOGRAPHIC STUDIES: I have personally review the images and agree with the findings below: marked FDG avid mediastinal lymph nodes. Mild uptake under left arm (prior COVID vaccine). No disease noted elsewhere  DG Chest 2 View  Result Date: 12/06/2019 CLINICAL DATA:  Pre admit for lung BX,,Mediastinal adenopathy Asthma at young age,,former smoker EXAM: CHEST - 2 VIEW COMPARISON:  Chest radiograph 11/17/2019 FINDINGS: Stable cardiomediastinal contours including right paratracheal convexity. The lungs are clear. No pneumothorax or pleural effusion. No acute finding in the visualized skeleton. IMPRESSION: 1. No acute cardiopulmonary process. 2. Stable right paratracheal convexity corresponding to known adenopathy. Electronically Signed   By: Audie Pinto M.D.   On: 12/06/2019 15:56   CT ABDOMEN PELVIS W CONTRAST  Result Date: 11/19/2019 CLINICAL DATA:  Inguinal and pelvic lymphadenopathy, abnormal CT chest exam demonstrating thymic enlargement and mediastinal adenopathy EXAM: CT ABDOMEN AND PELVIS WITH CONTRAST TECHNIQUE: Multidetector CT imaging of the abdomen and pelvis was performed using the standard protocol following bolus administration of intravenous contrast.  Sagittal and coronal MPR images reconstructed from axial data set. CONTRAST:  166m OMNIPAQUE IOHEXOL 300 MG/ML SOLN IV. Dilute oral contrast. COMPARISON:  11/14/2017 FINDINGS: Lower chest: Mild bibasilar atelectasis. Hepatobiliary: Minimal focal fatty infiltration adjacent to falciform fissure. Slightly lower attenuation within the caudate lobe than remainder of liver, new since previous exam. This  is ill-defined, question steatosis versus subtle mass. No additional focal hepatic abnormalities. Gallbladder unremarkable. Pancreas: Normal appearance Spleen: Normal size and appearance Adrenals/Urinary Tract: Adrenal glands, kidneys, ureters, and bladder normal appearance Stomach/Bowel: Appendix surgically absent by history. Stomach and bowel loops normal appearance Vascular/Lymphatic: Vascular structures patent. Aorta normal caliber. No abdominal, pelvic, or inguinal adenopathy. Reproductive: Unremarkable prostate gland and seminal vesicles Other: No free air or free fluid. No hernia or inflammatory process. Musculoskeletal: Osseous structures unremarkable. IMPRESSION: Question subtle mass versus steatosis within the caudate lobe of liver; due to history of thoracic adenopathy and thymic infiltration, follow-up non emergent MR imaging of the liver with and without contrast recommended to exclude hepatic mass. No abdominal, pelvic, or inguinal adenopathy identified. No additional intra-abdominal or intrapelvic abnormalities. Electronically Signed   By: Lavonia Dana M.D.   On: 11/19/2019 14:46   NM PET Image Initial (PI) Skull Base To Thigh  Result Date: 11/24/2019 CLINICAL DATA:  Initial treatment strategy for evaluate mediastinal adenopathy. COVID vaccine in left arm 10/26/2019. EXAM: NUCLEAR MEDICINE PET SKULL BASE TO THIGH TECHNIQUE: 10.3 mCi F-18 FDG was injected intravenously. Full-ring PET imaging was performed from the skull base to thigh after the radiotracer. CT data was obtained and used for attenuation  correction and anatomic localization. Fasting blood glucose: 94 mg/dl COMPARISON:  11/17/2019 CTA chest.  11/19/2019 abdominopelvic CT. FINDINGS: Mediastinal blood pool activity: SUV max 1.9 Liver activity: SUV max 3.4 NECK: No areas of abnormal hypermetabolism. Incidental CT findings: No cervical adenopathy. Left maxillary sinus mucous retention cyst or polyp. CHEST: Anterior mid images mediastinal hypermetabolic adenopathy. Index right paratracheal node measures 2.4 cm and a S.U.V. max of 11.0 on 61/4. Anterior mediastinal nodal mass measures 6.1 x 3.0 cm and a S.U.V. max of 25.3 on 73/4 Small left axillary nodes with low-level hypermetabolism, including at a S.U.V. max of 3.5. Likely secondary to recent vaccine. Incidental CT findings: Deferred to recent diagnostic CT. ABDOMEN/PELVIS: scattered foci of sigmoid hypermetabolism are favored to be physiologic. No hepatic hypermetabolism, including at the site of left liver lobe abnormality on abdominal CT of 11/19/2019. This is most consistent with focal steatosis. No abdominopelvic nodal hypermetabolism. Incidental CT findings: Normal adrenal glands. No abdominopelvic adenopathy. Appendectomy. SKELETON: No abnormal marrow activity. Incidental CT findings: none IMPRESSION: Hypermetabolic mediastinal adenopathy, consistent with lymphoma. ([place]) 5. No extrathoracic disease. Electronically Signed   By: Abigail Miyamoto M.D.   On: 11/24/2019 14:09    ASSESSMENT & PLAN AXLE PARFAIT 29 y.o. male with medical history significant for classical Hodgkin Lymphoma stage I who presents for a follow up visit.   Bulk of today's exam was dedicated towards discussion of the diagnosis and the steps moving forward.  Today we discussed the patient has a stage I classical Hodgkin's lymphoma, favorable disease.  He does not currently have any B symptoms, and he has only mildly elevations in ESR.  Additionally he is young and otherwise fit and therefore would be able to  tolerate full ABVD chemotherapy.  We discussed expected side effects including nausea, vomiting, constipation, nerve damage, fertility loss, hair loss, and possible secondary malignancy.  The patient and his parents voiced their understanding of the process moving forward including obtaining PFTs, chemotherapy education, port placement, and fertility preservation at [facility].  They have the opportunity to ask all questions and concerns regarding treatment moving forward.  The regimen of choice for this stage I classical Hodgkin's lymphoma, favorable disease would consist of ABVD chemotherapy.  ABVD is  comprised of doxorubicin 25 mg/m IV, bleomycin 10 units/m IV, vinblastine 6 g/m IV, and dacarbazine 375 mg/m IV all to be administered on days 1 and 15 of a 28-day cycle.  After 2 cycles the patient is to undergo a PET CT scan at which time treatment moving forward can be dictated by response to therapy.  # Classical Hodgkin Lymphoma, Stage I.  Favorable Disease.  --plan for ABVD chemotherapy (noted above) to start on 12/27/2019 after completion of PFTs, Port, and fertility preservation. TTE performed on 11/18/2019 (EF 65%)  --discussed the risks and benefits of treatment. Additionally noted that we will re-evaluate with a PET CT scan after 4 doses of chemotherapy.  --Will have patient return to clinic q 2 weeks with treatment.   # Symptom Management --will prescribe zofran 62m PO q8H PRN and compazine 194mq6H PRN.  --EMLA cream for port -- assure daily BM while on therapy with vincristine. Assure patient has senna docusate and miralax on hand during treatment.  --strict return precautions for shortness of breath, cough, fevers.   Orders Placed This Encounter  Procedures  . IR Fluoro Guide CV Line Right    Indicate type of CVC ordering    Standing Status:   Future    Standing Expiration Date:   12/17/2020    Order Specific Question:   Reason for exam:    Answer:   Starting  chemotherapy for hodgkins Lymphoma    Order Specific Question:   Preferred Imaging Location?    [facility]    Referral Priority:   Routine    Referral Type:   Consultation    Referral Reason:   Specialty Services Required    Requested Specialty:   Oncology    Number of Visits Requested:   1  . Pulmonary Function Test    PFT requested for clearance prior to Bleomycin chemotherapy    Standing Status:   Future    Number of Occurrences:   1    Standing Expiration Date:   12/17/2020    Order Specific Question:   Where should this test be performed?    Answer:   [facility]    Order Specific Question:   Full PFT: includes the following: basic spirometry, spirometry pre & post bronchodilator, diffusion capacity (DLCO), lung volumes    Answer:   Full PFT    Order Specific Question:   MIP/MEP    Answer:   No    Order Specific Question:   6 minute walk    Answer:   No    Order Specific Question:   ABG    Answer:   No    Order Specific Question:   Diffusion capacity (DLCO)    Answer:   Yes    Order Specific Question:   Lung volumes    Answer:   Yes    Order Specific Question:   Methacholine challenge    Answer:   No    Order Specific Question:   Release to patient    Answer:   Immediate    All questions were answered. The patient knows to call the clinic with any problems, questions or concerns.  A total of more than 30 minutes were spent on this encounter and over half of that time was spent on counseling and coordination of care as outlined above.   JoLedell PeoplesMD Department of Hematology/Oncology [facility]:  [phone] Pager: [phone] Email: [email or web address]  12/19/2019 2:11 PM

## 2019-12-19 ENCOUNTER — Encounter: Payer: Self-pay | Admitting: Hematology and Oncology

## 2019-12-19 ENCOUNTER — Telehealth: Payer: Self-pay | Admitting: *Deleted

## 2019-12-19 ENCOUNTER — Ambulatory Visit (INDEPENDENT_AMBULATORY_CARE_PROVIDER_SITE_OTHER): Payer: BC Managed Care – PPO | Admitting: Internal Medicine

## 2019-12-19 DIAGNOSIS — C8112 Nodular sclerosis classical Hodgkin lymphoma, intrathoracic lymph nodes: Secondary | ICD-10-CM | POA: Diagnosis not present

## 2019-12-19 LAB — PULMONARY FUNCTION TEST
DL/VA % pred: 91 %
DL/VA: 4.6 ml/min/mmHg/L
DLCO cor % pred: 99 %
DLCO cor: 34.06 ml/min/mmHg
DLCO unc % pred: 101 %
DLCO unc: 34.9 ml/min/mmHg
FEF 25-75 Post: 4.48 L/sec
FEF 25-75 Pre: 3.82 L/sec
FEF2575-%Change-Post: 17 %
FEF2575-%Pred-Post: 93 %
FEF2575-%Pred-Pre: 79 %
FEV1-%Change-Post: 5 %
FEV1-%Pred-Post: 103 %
FEV1-%Pred-Pre: 97 %
FEV1-Post: 4.86 L
FEV1-Pre: 4.62 L
FEV1FVC-%Change-Post: 11 %
FEV1FVC-%Pred-Pre: 92 %
FEV6-%Change-Post: -5 %
FEV6-%Pred-Post: 99 %
FEV6-%Pred-Pre: 105 %
FEV6-Post: 5.69 L
FEV6-Pre: 6.01 L
FEV6FVC-%Pred-Post: 100 %
FEV6FVC-%Pred-Pre: 100 %
FVC-%Change-Post: -5 %
FVC-%Pred-Post: 98 %
FVC-%Pred-Pre: 104 %
FVC-Post: 5.69 L
FVC-Pre: 6.01 L
Post FEV1/FVC ratio: 86 %
Post FEV6/FVC ratio: 100 %
Pre FEV1/FVC ratio: 77 %
Pre FEV6/FVC Ratio: 100 %
RV % pred: 123 %
RV: 1.98 L
TLC % pred: 109 %
TLC: 7.83 L

## 2019-12-19 NOTE — Telephone Encounter (Signed)
-----   Message from Garner Nash, DO sent at 12/18/2019  1:50 PM EDT ----- Absolutely Martin Spencer  Thanks Lowry Ram, can we get this guy PFTs ASAP, in the office or in the hospital. FULL set, spiro,volumes and dlco. So he can start chemo? Thanks  Leory Plowman   ----- Message ----- From: Orson Slick, MD Sent: 12/18/2019   1:03 PM EDT To: Garner Nash, DO  Dr. Valeta Harms,  I wanted to reach out to you about [name]. He is the 29 year old male with enlarged mediastinal lymph nodes, now found to have Hodgkin lymphoma. I am currently preparing him for ABVD chemotherapy, but need PFTs prior to start. I am hoping to get everything he needs completed this week. Would you be able to help me expedite his PFTs so we can start chemo ASAP?  Thank you for your help,  Best,  Martin Spencer

## 2019-12-19 NOTE — Telephone Encounter (Signed)
Will call first thing in am. Hospital scheduling is not available at this time

## 2019-12-19 NOTE — Telephone Encounter (Signed)
I think the hospital is fine.  The PFTs are pending him starting chemo.  He needs them to start chemo.  So I am sure he would prefer to go as soon as possible  Garner Nash, DO [facility] 12/19/2019 5:10 PM

## 2019-12-19 NOTE — Progress Notes (Signed)
PFT done today. 

## 2019-12-19 NOTE — Telephone Encounter (Signed)
01/11/20 is my only available  Going to hospital will cost him $$$ more  Let me know if this is too late of a date?

## 2019-12-20 ENCOUNTER — Other Ambulatory Visit: Payer: Self-pay

## 2019-12-20 ENCOUNTER — Ambulatory Visit: Payer: BC Managed Care – PPO | Admitting: Hematology and Oncology

## 2019-12-20 ENCOUNTER — Inpatient Hospital Stay: Payer: BC Managed Care – PPO

## 2019-12-20 ENCOUNTER — Other Ambulatory Visit: Payer: BC Managed Care – PPO

## 2019-12-20 NOTE — Telephone Encounter (Signed)
Dr. Lorenso Courier, PFT complete. They look good.   PFT Results Latest Ref Rng & Units 12/19/2019  FVC-Pre L 6.01  FVC-Predicted Pre % 104  FVC-Post L 5.69  FVC-Predicted Post % 98  Pre FEV1/FVC % % 77  Post FEV1/FCV % % 86  FEV1-Pre L 4.62  FEV1-Predicted Pre % 97  FEV1-Post L 4.86  DLCO uncorrected ml/min/mmHg 34.90  DLCO UNC% % 101  DLCO corrected ml/min/mmHg 34.06  DLCO COR %Predicted % 99  DLVA Predicted % 91  TLC L 7.83  TLC % Predicted % 109  RV % Predicted % 123    I called the patient.   Thanks  Garner Nash, DO [facility] 12/20/2019 1:09 PM

## 2019-12-20 NOTE — Telephone Encounter (Signed)
Patient has bad the PFT and wants you to call him

## 2019-12-20 NOTE — Telephone Encounter (Signed)
Thank you for your help. He is on track to start therapy next week.

## 2019-12-21 ENCOUNTER — Telehealth: Payer: Self-pay | Admitting: Hematology and Oncology

## 2019-12-21 ENCOUNTER — Other Ambulatory Visit: Payer: Self-pay | Admitting: *Deleted

## 2019-12-21 DIAGNOSIS — Z3141 Encounter for fertility testing: Secondary | ICD-10-CM | POA: Diagnosis not present

## 2019-12-21 MED ORDER — LIDOCAINE-PRILOCAINE 2.5-2.5 % EX CREA
1.0000 | TOPICAL_CREAM | CUTANEOUS | 1 refills | Status: DC | PRN
Start: 2019-12-21 — End: 2021-09-23

## 2019-12-21 MED ORDER — PROCHLORPERAZINE MALEATE 10 MG PO TABS
10.0000 mg | ORAL_TABLET | Freq: Four times a day (QID) | ORAL | 1 refills | Status: DC | PRN
Start: 2019-12-21 — End: 2020-05-17

## 2019-12-21 MED ORDER — ONDANSETRON HCL 8 MG PO TABS
8.0000 mg | ORAL_TABLET | Freq: Three times a day (TID) | ORAL | 1 refills | Status: DC | PRN
Start: 2019-12-21 — End: 2020-05-17

## 2019-12-21 NOTE — Telephone Encounter (Signed)
Scheduled per los. Called and spoke with patient. Confirmed appt 

## 2019-12-22 DIAGNOSIS — C8190 Hodgkin lymphoma, unspecified, unspecified site: Secondary | ICD-10-CM | POA: Diagnosis not present

## 2019-12-22 DIAGNOSIS — C8112 Nodular sclerosis classical Hodgkin lymphoma, intrathoracic lymph nodes: Secondary | ICD-10-CM | POA: Diagnosis not present

## 2019-12-22 NOTE — Progress Notes (Signed)
Pharmacist Chemotherapy Monitoring - Initial Assessment    Anticipated start date: 12/22/19  Regimen:  . Are orders appropriate based on the patient's diagnosis, regimen, and cycle? Yes . Does the plan date match the patient's scheduled date? Yes . Is the sequencing of drugs appropriate? Yes . Are the premedications appropriate for the patient's regimen? Yes . Prior Authorization for treatment is: Approved o If applicable, is the correct biosimilar selected based on the patient's insurance? not applicable  Organ Function and Labs: Marland Kitchen Are dose adjustments needed based on the patient's renal function, hepatic function, or hematologic function? No . Are appropriate labs ordered prior to the start of patient's treatment? Yes . Other organ system assessment, if indicated: anthracyclines: Echo/ MUGA and bleomycin: PFTs . The following baseline labs, if indicated, have been ordered: N/A  Dose Assessment: . Are the drug doses appropriate? Yes . Are the following correct: o Drug concentrations Yes o IV fluid compatible with drug Yes o Administration routes Yes o Timing of therapy Yes . If applicable, does the patient have documented access for treatment and/or plans for port-a-cath placement? yes . If applicable, have lifetime cumulative doses been properly documented and assessed? yes Lifetime Dose Tracking  No doses have been documented on this patient for the following tracked chemicals: Doxorubicin, Epirubicin, Idarubicin, Daunorubicin, Mitoxantrone, Bleomycin, Oxaliplatin, Carboplatin, Liposomal Doxorubicin  o   Toxicity Monitoring/Prevention: . The patient has the following take home antiemetics prescribed: Ondansetron, Prochlorperazine, Dexamethasone and Lorazepam . The patient has the following take home medications prescribed: N/A . Medication allergies and previous infusion related reactions, if applicable, have been reviewed and addressed. Yes . The patient's current medication  list has been assessed for drug-drug interactions with their chemotherapy regimen. no significant drug-drug interactions were identified on review.  Order Review: . Are the treatment plan orders signed? Yes . Is the patient scheduled to see a provider prior to their treatment? No  I verify that I have reviewed each item in the above checklist and answered each question accordingly.  Martin Spencer 12/22/2019 3:04 PM

## 2019-12-25 ENCOUNTER — Other Ambulatory Visit: Payer: Self-pay

## 2019-12-25 ENCOUNTER — Other Ambulatory Visit: Payer: Self-pay | Admitting: Student

## 2019-12-25 ENCOUNTER — Encounter: Payer: Self-pay | Admitting: Internal Medicine

## 2019-12-25 ENCOUNTER — Ambulatory Visit: Payer: BC Managed Care – PPO | Admitting: Internal Medicine

## 2019-12-25 ENCOUNTER — Telehealth: Payer: Self-pay | Admitting: *Deleted

## 2019-12-25 ENCOUNTER — Other Ambulatory Visit: Payer: Self-pay | Admitting: Radiology

## 2019-12-25 VITALS — BP 130/72 | HR 72 | Temp 97.2°F | Ht 71.0 in | Wt 181.0 lb

## 2019-12-25 DIAGNOSIS — R079 Chest pain, unspecified: Secondary | ICD-10-CM

## 2019-12-25 DIAGNOSIS — C8112 Nodular sclerosis classical Hodgkin lymphoma, intrathoracic lymph nodes: Secondary | ICD-10-CM

## 2019-12-25 DIAGNOSIS — I309 Acute pericarditis, unspecified: Secondary | ICD-10-CM

## 2019-12-25 NOTE — Patient Instructions (Signed)
Medication Instructions:  STOP taking ibuprofen STOP Colchicine  *If you need a refill on your cardiac medications before your next appointment, please call your pharmacy*   Lab Work: None Ordered At This Time.   If you have labs (blood work) drawn today and your tests are completely normal, you will receive your results only by: Marland Kitchen MyChart Message (if you have MyChart) OR . A paper copy in the mail If you have any lab test that is abnormal or we need to change your treatment, we will call you to review the results.   Testing/Procedures: None Ordered At This Time.     Follow-Up: At [facility], you and your health needs are our priority.  As part of our continuing mission to provide you with exceptional heart care, we have created designated Provider Care Teams.  These Care Teams include your primary Cardiologist (physician) and Advanced Practice Providers (APPs -  Physician Assistants and Nurse Practitioners) who all work together to provide you with the care you need, when you need it.  Your next appointment:   8 week(s)  The format for your next appointment:   In Person  Provider:   Cherlynn Kaiser, MD

## 2019-12-25 NOTE — Progress Notes (Signed)
Cardiology Office Note:    Date:  12/25/2019   ID:  Martin Spencer, DOB 29-Oct-1990, MRN [id number]  PCP:  Patient, No Pcp Per  Cardiologist:  Elouise Munroe, MD  Electrophysiologist:  None   Referring MD: No ref. provider found   Chief Complaint: Hodgkins lymphoma, pericarditis  History of Present Illness:    Martin Spencer is a 29 y.o. 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   Today he feels ok overall but notes continued 2/10 chest pain, suggesting continued inflammation vs pleural discomfort from lymphoma.  Has continued on NSAIDs and colchicine, but stopped colchicine last week on the advice of Dr. Lorenso Courier. No N/v. Would stop his NSAID today, continue colchicine if possible for total of 3 mo, however has been held by oncology.   Past Medical History:  Diagnosis Date  . ADD (attention deficit disorder)   . Cancer ([place])   . Childhood asthma   . Pericarditis   . Shortness of breath    per patient, since recent diagnoses of pericarditis, sometimes get SOB on rest    Past Surgical History:  Procedure Laterality Date  . APPENDECTOMY    . FINE NEEDLE ASPIRATION  11/30/2019   Procedure: FINE NEEDLE ASPIRATION (FNA) LINEAR;  Surgeon: Candee Furbish, MD;  Location: [facility] ENDOSCOPY;  Service: Pulmonary;;  . LAPAROSCOPIC APPENDECTOMY N/A 11/14/2017   Procedure: APPENDECTOMY LAPAROSCOPIC;  Surgeon: Georganna Skeans, MD;  Location: [place];  Service: General;  Laterality: N/A;  . MEDIASTINOSCOPY N/A 12/08/2019   Procedure: MEDIASTINOSCOPY;  Surgeon: Melrose Nakayama, MD;  Location: [place];  Service: Thoracic;  Laterality: N/A;  . VIDEO BRONCHOSCOPY WITH ENDOBRONCHIAL ULTRASOUND N/A 11/30/2019   Procedure: VIDEO BRONCHOSCOPY WITH ENDOBRONCHIAL ULTRASOUND;  Surgeon: Candee Furbish, MD;  Location: [facility] ENDOSCOPY;  Service: Pulmonary;  Laterality: N/A;  . WISDOM TOOTH EXTRACTION       Current Medications: Current Meds  Medication Sig  . lidocaine-prilocaine (EMLA) cream Apply 1 application topically as needed.     Allergies:   Other   Social History   Tobacco Use  . Smoking status: Former Smoker    Types: Cigarettes  . Smokeless tobacco: Never Used  . Tobacco comment: quit in high school  Vaping Use  . Vaping Use: Never used  Substance Use Topics  . Alcohol use: Yes    Comment: per patient "rarely"  . Drug use: Not Currently    Types: Marijuana    Comment: CBD last use 2 weeks ago ( 11/29/19)     Family History: The patient's family history includes Colon cancer in his paternal grandfather; Kidney cancer in his father.  ROS:   Please see the history of present illness.    All other systems reviewed and are negative.  EKGs/Labs/Other Studies Reviewed:    The following studies were reviewed today:  Recent Labs: 11/18/2019: TSH 1.728 12/06/2019: ALT 28; BUN 13; Creatinine, Ser 0.71; Hemoglobin 15.5; Platelets 261; Potassium 4.1; Sodium 139  Recent Lipid Panel No results found for: CHOL, TRIG, HDL, CHOLHDL, VLDL, LDLCALC, LDLDIRECT  Physical Exam:    VS:  BP 130/72   Pulse 72   Temp (!) 97.2 F (36.2 C)   Ht 5\' 11"  (1.803 m)   Wt 181 lb (82.1 kg)   SpO2 98%   BMI 25.24 kg/m     Wt Readings from Last 5 Encounters:  12/25/19 181 lb (82.1 kg)  12/18/19 179 lb (81.2  kg)  12/08/19 180 lb 8.9 oz (81.9 kg)  12/06/19 180 lb 9.6 oz (81.9 kg)  12/05/19 184 lb (83.5 kg)     Constitutional: No acute distress Eyes: sclera non-icteric, normal conjunctiva and lids ENMT: normal dentition, moist mucous membranes Cardiovascular: regular rhythm, normal rate, no murmurs. S1 and S2 normal. Radial pulses normal bilaterally. No jugular venous distention.  Respiratory: clear to auscultation bilaterally GI : normal bowel sounds, soft and nontender. No distention.   MSK: extremities warm, well perfused. No edema.  NEURO: grossly nonfocal exam, moves all  extremities. PSYCH: alert and oriented x 3, normal mood and affect.   ASSESSMENT:    1. Acute pericarditis, unspecified type   2. Chest pain, unspecified type   3. 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 Continue colchicine until end October 2021 if possible, however he will be starting chemotherapy Friday so this has been stopped. Will review with Dr. Lorenso Courier and update patient on plan. Could be related to lymphoma as well.   Total time of encounter: 20 minutes total time of encounter, including 15 minutes spent in face-to-face patient care on the date of this encounter. This time includes coordination of care and counseling regarding above mentioned problem list. Remainder of non-face-to-face time involved reviewing chart documents/testing relevant to the patient encounter and documentation in the medical record. I have independently reviewed documentation from referring provider.   Cherlynn Kaiser, MD [place]  [facility]    Medication Adjustments/Labs and Tests Ordered: Current medicines are reviewed at length with the patient today.  Concerns regarding medicines are outlined above.  No orders of the defined types were placed in this encounter.  No orders of the defined types were placed in this encounter.   Patient Instructions  Medication Instructions:  STOP taking ibuprofen STOP Colchicine  *If you need a refill on your cardiac medications before your next appointment, please call your pharmacy*   Lab Work: None Ordered At This Time.   If you have labs (blood work) drawn today and your tests are completely normal, you will receive your results only by: Marland Kitchen MyChart Message (if you have MyChart) OR . A paper copy in the mail If you have any lab test that is abnormal or we need to change your treatment, we will call you to review  the results.   Testing/Procedures: None Ordered At This Time.     Follow-Up: At [facility], you and your health needs are our priority.  As part of our continuing mission to provide you with exceptional heart care, we have created designated Provider Care Teams.  These Care Teams include your primary Cardiologist (physician) and Advanced Practice Providers (APPs -  Physician Assistants and Nurse Practitioners) who all work together to provide you with the care you need, when you need it.  Your next appointment:   8 week(s)  The format for your next appointment:   In Person  Provider:   Cherlynn Kaiser, MD

## 2019-12-25 NOTE — Telephone Encounter (Signed)
Received call from patient. He states he is feeling anxious about everything that is going to be happening this week-port placement and his first chemotherapy treatment (on his birthday). He is asking if Dr. Lorenso Courier will prescribe any Lorazepam for him. Advised that usually he does not but that I would ask. Spoke to Dr. Lorenso Courier about the above. He states he will discuss pt's anxiety with him on Wednesday but that he will not prescribe any lorazepam at this time.  tct back to patient and explained the above. Advised that he can come and talk to Dr. Lorenso Courier after his port placement tomorrow if he wanted to.  Dr. Lorenso Courier sees him prior to chemo on Wednesday as well.  Pt voiced understanding.

## 2019-12-26 ENCOUNTER — Ambulatory Visit (HOSPITAL_COMMUNITY)
Admission: RE | Admit: 2019-12-26 | Discharge: 2019-12-26 | Disposition: A | Discharge status: Home / Self Care | Payer: BC Managed Care – PPO | Source: Ambulatory Visit | Attending: Hematology and Oncology | Admitting: Hematology and Oncology

## 2019-12-26 ENCOUNTER — Encounter (HOSPITAL_COMMUNITY): Payer: Self-pay

## 2019-12-26 ENCOUNTER — Other Ambulatory Visit: Payer: Self-pay

## 2019-12-26 ENCOUNTER — Other Ambulatory Visit: Payer: Self-pay | Admitting: Hematology and Oncology

## 2019-12-26 DIAGNOSIS — I319 Disease of pericardium, unspecified: Secondary | ICD-10-CM | POA: Insufficient documentation

## 2019-12-26 DIAGNOSIS — R590 Localized enlarged lymph nodes: Secondary | ICD-10-CM

## 2019-12-26 DIAGNOSIS — Z800 Family history of malignant neoplasm of digestive organs: Secondary | ICD-10-CM | POA: Insufficient documentation

## 2019-12-26 DIAGNOSIS — Z452 Encounter for adjustment and management of vascular access device: Secondary | ICD-10-CM | POA: Diagnosis not present

## 2019-12-26 DIAGNOSIS — C8190 Hodgkin lymphoma, unspecified, unspecified site: Secondary | ICD-10-CM | POA: Diagnosis not present

## 2019-12-26 DIAGNOSIS — Z79899 Other long term (current) drug therapy: Secondary | ICD-10-CM | POA: Insufficient documentation

## 2019-12-26 DIAGNOSIS — Z87891 Personal history of nicotine dependence: Secondary | ICD-10-CM | POA: Diagnosis not present

## 2019-12-26 DIAGNOSIS — Z8051 Family history of malignant neoplasm of kidney: Secondary | ICD-10-CM | POA: Diagnosis not present

## 2019-12-26 HISTORY — PX: IR IMAGING GUIDED PORT INSERTION: IMG5740

## 2019-12-26 LAB — CBC WITH DIFFERENTIAL/PLATELET
Abs Immature Granulocytes: 0.02 10*3/uL (ref 0.00–0.07)
Basophils Absolute: 0 10*3/uL (ref 0.0–0.1)
Basophils Relative: 0 %
Eosinophils Absolute: 0 10*3/uL (ref 0.0–0.5)
Eosinophils Relative: 1 %
HCT: 41.5 % (ref 39.0–52.0)
Hemoglobin: 14.5 g/dL (ref 13.0–17.0)
Immature Granulocytes: 0 %
Lymphocytes Relative: 26 %
Lymphs Abs: 2 10*3/uL (ref 0.7–4.0)
MCH: 29 pg (ref 26.0–34.0)
MCHC: 34.9 g/dL (ref 30.0–36.0)
MCV: 83 fL (ref 80.0–100.0)
Monocytes Absolute: 0.6 10*3/uL (ref 0.1–1.0)
Monocytes Relative: 8 %
Neutro Abs: 4.9 10*3/uL (ref 1.7–7.7)
Neutrophils Relative %: 65 %
Platelets: 255 10*3/uL (ref 150–400)
RBC: 5 MIL/uL (ref 4.22–5.81)
RDW: 12.4 % (ref 11.5–15.5)
WBC: 7.6 10*3/uL (ref 4.0–10.5)
nRBC: 0 % (ref 0.0–0.2)

## 2019-12-26 LAB — PROTIME-INR
INR: 1 (ref 0.8–1.2)
Prothrombin Time: 13.2 seconds (ref 11.4–15.2)

## 2019-12-26 MED ORDER — DIPHENHYDRAMINE HCL 50 MG/ML IJ SOLN
INTRAMUSCULAR | Status: AC | PRN
  Administered 2019-12-26: 25 mg via INTRAVENOUS

## 2019-12-26 MED ORDER — FENTANYL CITRATE (PF) 100 MCG/2ML IJ SOLN
INTRAMUSCULAR | Status: AC | PRN
  Administered 2019-12-26 (×4): 50 ug via INTRAVENOUS

## 2019-12-26 MED ORDER — LIDOCAINE-EPINEPHRINE 1 %-1:100000 IJ SOLN
INTRAMUSCULAR | Status: AC | PRN
  Administered 2019-12-26: 10 mL

## 2019-12-26 MED ORDER — LIDOCAINE HCL 1 % IJ SOLN
INTRAMUSCULAR | Status: AC
  Filled 2019-12-26: qty 20

## 2019-12-26 MED ORDER — FENTANYL CITRATE (PF) 100 MCG/2ML IJ SOLN
INTRAMUSCULAR | Status: AC
  Filled 2019-12-26: qty 4

## 2019-12-26 MED ORDER — CEFAZOLIN SODIUM-DEXTROSE 2-4 GM/100ML-% IV SOLN: 2.0000 g | INTRAVENOUS | Status: AC

## 2019-12-26 MED ORDER — HEPARIN SOD (PORK) LOCK FLUSH 100 UNIT/ML IV SOLN
INTRAVENOUS | Status: AC
  Filled 2019-12-26: qty 5

## 2019-12-26 MED ORDER — FENTANYL CITRATE (PF) 100 MCG/2ML IJ SOLN
25.0000 ug | Freq: Once | INTRAMUSCULAR | Status: AC
  Administered 2019-12-26: 25 ug via INTRAVENOUS

## 2019-12-26 MED ORDER — DIPHENHYDRAMINE HCL 50 MG/ML IJ SOLN
INTRAMUSCULAR | Status: AC
  Filled 2019-12-26: qty 1

## 2019-12-26 MED ORDER — MIDAZOLAM HCL 2 MG/2ML IJ SOLN
INTRAMUSCULAR | Status: AC | PRN
  Administered 2019-12-26: 1 mg via INTRAVENOUS
  Administered 2019-12-26: 2 mg via INTRAVENOUS
  Administered 2019-12-26 (×3): 1 mg via INTRAVENOUS

## 2019-12-26 MED ORDER — MIDAZOLAM HCL 2 MG/2ML IJ SOLN
INTRAMUSCULAR | Status: AC
  Filled 2019-12-26: qty 2

## 2019-12-26 MED ORDER — FENTANYL CITRATE (PF) 100 MCG/2ML IJ SOLN
INTRAMUSCULAR | Status: AC
  Filled 2019-12-26: qty 2

## 2019-12-26 MED ORDER — LIDOCAINE-EPINEPHRINE 1 %-1:100000 IJ SOLN
INTRAMUSCULAR | Status: AC
  Filled 2019-12-26: qty 1

## 2019-12-26 MED ORDER — MIDAZOLAM HCL 2 MG/2ML IJ SOLN
INTRAMUSCULAR | Status: AC
  Filled 2019-12-26: qty 4

## 2019-12-26 MED ORDER — LIDOCAINE HCL (PF) 1 % IJ SOLN
INTRAMUSCULAR | Status: AC | PRN
  Administered 2019-12-26: 5 mL

## 2019-12-26 MED ORDER — CEFAZOLIN SODIUM-DEXTROSE 2-4 GM/100ML-% IV SOLN
INTRAVENOUS | Status: AC
  Administered 2019-12-26: 2 g via INTRAVENOUS
  Filled 2019-12-26: qty 100

## 2019-12-26 MED ORDER — SODIUM CHLORIDE 0.9 % IV SOLN: INTRAVENOUS | Status: DC

## 2019-12-26 NOTE — Consult Note (Signed)
Chief Complaint: Patient was seen in consultation today for image guided Port-A-Cath placement  Referring Physician(s): Dorsey,John T IV  Supervising Physician: Daryll Brod  Patient Status: [name]  History of Present Illness: Martin Spencer is a 29 y.o. male with history of newly diagnosed Hodgkin's lymphoma who presents today for Port-A-Cath placement for chemotherapy.  Additional medical history as below.  Past Medical History:  Diagnosis Date  . ADD (attention deficit disorder)   . Cancer ([place])   . Childhood asthma   . Pericarditis   . Shortness of breath    per patient, since recent diagnoses of pericarditis, sometimes get SOB on rest    Past Surgical History:  Procedure Laterality Date  . APPENDECTOMY    . FINE NEEDLE ASPIRATION  11/30/2019   Procedure: FINE NEEDLE ASPIRATION (FNA) LINEAR;  Surgeon: Candee Furbish, MD;  Location: [facility] ENDOSCOPY;  Service: Pulmonary;;  . LAPAROSCOPIC APPENDECTOMY N/A 11/14/2017   Procedure: APPENDECTOMY LAPAROSCOPIC;  Surgeon: Georganna Skeans, MD;  Location: [place];  Service: General;  Laterality: N/A;  . MEDIASTINOSCOPY N/A 12/08/2019   Procedure: MEDIASTINOSCOPY;  Surgeon: Melrose Nakayama, MD;  Location: [place];  Service: Thoracic;  Laterality: N/A;  . VIDEO BRONCHOSCOPY WITH ENDOBRONCHIAL ULTRASOUND N/A 11/30/2019   Procedure: VIDEO BRONCHOSCOPY WITH ENDOBRONCHIAL ULTRASOUND;  Surgeon: Candee Furbish, MD;  Location: [facility] ENDOSCOPY;  Service: Pulmonary;  Laterality: N/A;  . WISDOM TOOTH EXTRACTION      Allergies: Other  Medications: Prior to Admission medications   Medication Sig Start Date End Date Taking? Authorizing Provider  lidocaine-prilocaine (EMLA) cream Apply 1 application topically as needed. 12/21/19   Orson Slick, MD  omeprazole (PRILOSEC) 40 MG capsule Take 1 capsule (40 mg total) by mouth daily. 11/19/19 12/19/19  Darliss Cheney, MD  ondansetron (ZOFRAN) 8 MG tablet Take 1 tablet (8 mg total) by mouth  every 8 (eight) hours as needed for nausea or vomiting. 12/21/19   Orson Slick, MD  oxyCODONE (OXY IR/ROXICODONE) 5 MG immediate release tablet Take 1-2 tablets (5-10 mg total) by mouth every 6 (six) hours as needed for moderate pain or severe pain. Patient not taking: Reported on 12/18/2019 12/08/19   Melrose Nakayama, MD  prochlorperazine (COMPAZINE) 10 MG tablet Take 1 tablet (10 mg total) by mouth every 6 (six) hours as needed for nausea or vomiting. 12/21/19   Orson Slick, MD     Family History  Problem Relation Age of Onset  . Kidney cancer Father   . Colon cancer Paternal Grandfather     Social History   Socioeconomic History  . Marital status: Single    Spouse name: Not on file  . Number of children: Not on file  . Years of education: Not on file  . Highest education level: Not on file  Occupational History  . Not on file  Tobacco Use  . Smoking status: Former Smoker    Types: Cigarettes  . Smokeless tobacco: Never Used  . Tobacco comment: quit in high school  Vaping Use  . Vaping Use: Never used  Substance and Sexual Activity  . Alcohol use: Yes    Comment: per patient "rarely"  . Drug use: Not Currently    Types: Marijuana    Comment: CBD last use 2 weeks ago ( 11/29/19)  . Sexual activity: Not on file  Other Topics Concern  . Not on file  Social History Narrative  . Not on file   Social Determinants  of Health   Financial Resource Strain:   . Difficulty of Paying Living Expenses: Not on file  Food Insecurity:   . Worried About Charity fundraiser in the Last Year: Not on file  . Ran Out of Food in the Last Year: Not on file  Transportation Needs:   . Lack of Transportation (Medical): Not on file  . Lack of Transportation (Non-Medical): Not on file  Physical Activity:   . Days of Exercise per Week: Not on file  . Minutes of Exercise per Session: Not on file  Stress:   . Feeling of Stress : Not on file  Social Connections:   . Frequency of  Communication with Friends and Family: Not on file  . Frequency of Social Gatherings with Friends and Family: Not on file  . Attends Religious Services: Not on file  . Active Member of Clubs or Organizations: Not on file  . Attends Archivist Meetings: Not on file  . Marital Status: Not on file      Review of Systems denies fever, headache, dyspnea, cough, abdominal pain, nausea, vomiting or bleeding.  Does have some intermittent chest discomfort and back pain.  Vital Signs: BP 130/76   Pulse 77   Temp 98.2 F (36.8 C) (Oral)   Resp 20   SpO2 99%   Physical Exam awake, alert.  Chest clear to auscultation bilaterally.  Heart with regular rate and rhythm.  Abdomen soft, positive bowel sounds, nontender.  No lower extremity edema.  Imaging: DG Chest 2 View  Result Date: 12/06/2019 CLINICAL DATA:  Pre admit for lung BX,,Mediastinal adenopathy Asthma at young age,,former smoker EXAM: CHEST - 2 VIEW COMPARISON:  Chest radiograph 11/17/2019 FINDINGS: Stable cardiomediastinal contours including right paratracheal convexity. The lungs are clear. No pneumothorax or pleural effusion. No acute finding in the visualized skeleton. IMPRESSION: 1. No acute cardiopulmonary process. 2. Stable right paratracheal convexity corresponding to known adenopathy. Electronically Signed   By: Audie Pinto M.D.   On: 12/06/2019 15:56    Labs:  CBC: Recent Labs    11/18/19 0317 11/19/19 0501 12/06/19 1000 12/26/19 1318  WBC 12.5* 7.1 7.1 7.6  HGB 14.0 12.7* 15.5 14.5  HCT 40.3 38.5* 45.4 41.5  PLT 269 215 261 255    COAGS: Recent Labs    11/30/19 1122 12/06/19 1000 12/26/19 1318  INR 1.0 1.0 1.0  APTT 35 36  --     BMP: Recent Labs    11/17/19 1409 11/18/19 0317 11/30/19 1122 12/06/19 1000  NA 138 137 137 139  K 3.8 3.9 5.2* 4.1  CL 100 105 104 103  CO2 27 23 23 25   GLUCOSE 122* 213* 83 90  BUN 14 10 14 13   CALCIUM 9.3 9.4 9.5 9.8  CREATININE 0.94 0.80 0.85 0.71    GFRNONAA >60 >60 >60 >60  GFRAA >60 >60 >60 >60    LIVER FUNCTION TESTS: Recent Labs    11/18/19 0317 11/30/19 1122 12/06/19 1000  BILITOT 1.2 1.8* 1.5*  AST 15 45* 22  ALT 20 20 28   ALKPHOS 53 51 58  PROT 6.9 7.2 7.4  ALBUMIN 3.7 4.3 4.4    TUMOR MARKERS: No results for input(s): AFPTM, CEA, CA199, CHROMGRNA in the last 8760 hours.  Assessment and Plan:  29 y.o. male with history of newly diagnosed Hodgkin's lymphoma who presents today for Port-A-Cath placement for chemotherapy.Risks and benefits of image guided port-a-catheter placement was discussed with the patient including, but not limited to  bleeding, infection, pneumothorax, or fibrin sheath development and need for additional procedures.  All of the patient's questions were answered, patient is agreeable to proceed. Consent signed and in chart.     Thank you for this interesting consult.  I greatly enjoyed meeting Martin Spencer and look forward to participating in their care.  A copy of this report was sent to the requesting provider on this date.  Electronically Signed: D. Rowe Robert, PA-C 12/26/2019, 2:13 PM   I spent a total of  25 minutes   in face to face in clinical consultation, greater than 50% of which was counseling/coordinating care for Port-A-Cath placement

## 2019-12-26 NOTE — Discharge Instructions (Signed)
DO NOT APPLY EMLA CREAM OR LOTIONS TO THE PORT SITE X 2 WEEKS  DO NOT SHOWER X 24 HRS  FOR ANY QUESTIONS OR CONCERNS CALL [phone]  Implanted Port Insertion, Care After This sheet gives you information about how to care for yourself after your procedure. Your health care provider may also give you more specific instructions. If you have problems or questions, contact your health care provider. What can I expect after the procedure? After the procedure, it is common to have:  Discomfort at the port insertion site.  Bruising on the skin over the port. This should improve over 3-4 days. Follow these instructions at home: [facility] care  After your port is placed, you will get a manufacturer's information card. The card has information about your port. Keep this card with you at all times.  Take care of the port as told by your health care provider. Ask your health care provider if you or a family member can get training for taking care of the port at home. A home health care nurse may also take care of the port.  Make sure to remember what type of port you have. Incision care      Follow instructions from your health care provider about how to take care of your port insertion site. Make sure you: ? Wash your hands with soap and water before and after you change your bandage (dressing). If soap and water are not available, use hand sanitizer. ? Change your dressing as told by your health care provider. ? Leave stitches (sutures), skin glue, or adhesive strips in place. These skin closures may need to stay in place for 2 weeks or longer. If adhesive strip edges start to loosen and curl up, you may trim the loose edges. Do not remove adhesive strips completely unless your health care provider tells you to do that.  Check your port insertion site every day for signs of infection. Check for: ? Redness, swelling, or pain. ? Fluid or blood. ? Warmth. ? Pus or a bad smell. Activity  Return  to your normal activities as told by your health care provider. Ask your health care provider what activities are safe for you.  Do not lift anything that is heavier than 10 lb (4.5 kg), or the limit that you are told, until your health care provider says that it is safe. General instructions  Take over-the-counter and prescription medicines only as told by your health care provider.  Do not take baths, swim, or use a hot tub until your health care provider approves. Ask your health care provider if you may take showers. You may only be allowed to take sponge baths.  Do not drive for 24 hours if you were given a sedative during your procedure.  Wear a medical alert bracelet in case of an emergency. This will tell any health care providers that you have a port.  Keep all follow-up visits as told by your health care provider. This is important. Contact a health care provider if:  You cannot flush your port with saline as directed, or you cannot draw blood from the port.  You have a fever or chills.  You have redness, swelling, or pain around your port insertion site.  You have fluid or blood coming from your port insertion site.  Your port insertion site feels warm to the touch.  You have pus or a bad smell coming from the port insertion site. Get help right away if:  You  have chest pain or shortness of breath.  You have bleeding from your port that you cannot control. Summary  Take care of the port as told by your health care provider. Keep the manufacturer's information card with you at all times.  Change your dressing as told by your health care provider.  Contact a health care provider if you have a fever or chills or if you have redness, swelling, or pain around your port insertion site.  Keep all follow-up visits as told by your health care provider. This information is not intended to replace advice given to you by your health care provider. Make sure you discuss any  questions you have with your health care provider. Document Revised: 11/09/2017 Document Reviewed: 11/09/2017 Elsevier Patient Education  [place]. Moderate Conscious Sedation, Adult, Care After These instructions provide you with information about caring for yourself after your procedure. Your health care provider may also give you more specific instructions. Your treatment has been planned according to current medical practices, but problems sometimes occur. Call your health care provider if you have any problems or questions after your procedure. What can I expect after the procedure? After your procedure, it is common:  To feel sleepy for several hours.  To feel clumsy and have poor balance for several hours.  To have poor judgment for several hours.  To vomit if you eat too soon. Follow these instructions at home: For at least 24 hours after the procedure:   Do not: ? Participate in activities where you could fall or become injured. ? Drive. ? Use heavy machinery. ? Drink alcohol. ? Take sleeping pills or medicines that cause drowsiness. ? Make important decisions or sign legal documents. ? Take care of children on your own.  Rest. Eating and drinking  Follow the diet recommended by your health care provider.  If you vomit: ? Drink water, juice, or soup when you can drink without vomiting. ? Make sure you have little or no nausea before eating solid foods. General instructions  Have a responsible adult stay with you until you are awake and alert.  Take over-the-counter and prescription medicines only as told by your health care provider.  If you smoke, do not smoke without supervision.  Keep all follow-up visits as told by your health care provider. This is important. Contact a health care provider if:  You keep feeling nauseous or you keep vomiting.  You feel light-headed.  You develop a rash.  You have a fever. Get help right away if:  You have  trouble breathing. This information is not intended to replace advice given to you by your health care provider. Make sure you discuss any questions you have with your health care provider. Document Revised: 03/26/2017 Document Reviewed: 08/03/2015 Elsevier Patient Education  2020 [employer].

## 2019-12-26 NOTE — Procedures (Signed)
Interventional Radiology Procedure Note  Procedure: Rt IJ POWER PORT    Complications: None  Estimated Blood Loss:  MIN  Findings: TIP SVCRA    Tamera Punt, MD

## 2019-12-27 ENCOUNTER — Encounter: Payer: Self-pay | Admitting: Hematology and Oncology

## 2019-12-27 NOTE — Progress Notes (Signed)
Called pt to introduce myself as his Arboriculturist.  Unfortunately there aren't any foundations offering copay assistance for his Dx.  I offered the [employer], went over what it covers and gave him the income requirement.  He stated he exceeds that amount so he doesn't qualify for the grant at this time.  I will give him my card on 12/28/19 in case his situation changes and for any questions or concerns he may have in the future.

## 2019-12-28 ENCOUNTER — Inpatient Hospital Stay: Payer: BC Managed Care – PPO | Attending: Hematology and Oncology

## 2019-12-28 ENCOUNTER — Other Ambulatory Visit: Payer: Self-pay | Admitting: Hematology and Oncology

## 2019-12-28 ENCOUNTER — Inpatient Hospital Stay: Payer: BC Managed Care – PPO

## 2019-12-28 ENCOUNTER — Other Ambulatory Visit: Payer: Self-pay

## 2019-12-28 ENCOUNTER — Encounter: Payer: Self-pay | Admitting: Hematology and Oncology

## 2019-12-28 ENCOUNTER — Ambulatory Visit: Payer: BC Managed Care – PPO | Admitting: Hematology and Oncology

## 2019-12-28 ENCOUNTER — Other Ambulatory Visit: Payer: BC Managed Care – PPO

## 2019-12-28 ENCOUNTER — Inpatient Hospital Stay: Payer: BC Managed Care – PPO | Admitting: Hematology and Oncology

## 2019-12-28 VITALS — BP 130/86 | HR 82 | Temp 97.4°F | Resp 18 | Ht 71.0 in | Wt 183.5 lb

## 2019-12-28 VITALS — BP 119/74 | HR 87

## 2019-12-28 DIAGNOSIS — Z23 Encounter for immunization: Secondary | ICD-10-CM | POA: Diagnosis not present

## 2019-12-28 DIAGNOSIS — C8112 Nodular sclerosis classical Hodgkin lymphoma, intrathoracic lymph nodes: Secondary | ICD-10-CM

## 2019-12-28 DIAGNOSIS — Z5111 Encounter for antineoplastic chemotherapy: Secondary | ICD-10-CM | POA: Diagnosis not present

## 2019-12-28 DIAGNOSIS — R590 Localized enlarged lymph nodes: Secondary | ICD-10-CM | POA: Diagnosis not present

## 2019-12-28 DIAGNOSIS — D709 Neutropenia, unspecified: Secondary | ICD-10-CM | POA: Insufficient documentation

## 2019-12-28 DIAGNOSIS — Z95828 Presence of other vascular implants and grafts: Secondary | ICD-10-CM

## 2019-12-28 LAB — CBC WITH DIFFERENTIAL (CANCER CENTER ONLY)
Abs Immature Granulocytes: 0.02 10*3/uL (ref 0.00–0.07)
Basophils Absolute: 0 10*3/uL (ref 0.0–0.1)
Basophils Relative: 0 %
Eosinophils Absolute: 0.1 10*3/uL (ref 0.0–0.5)
Eosinophils Relative: 2 %
HCT: 39.1 % (ref 39.0–52.0)
Hemoglobin: 13.7 g/dL (ref 13.0–17.0)
Immature Granulocytes: 0 %
Lymphocytes Relative: 28 %
Lymphs Abs: 2.2 10*3/uL (ref 0.7–4.0)
MCH: 29.2 pg (ref 26.0–34.0)
MCHC: 35 g/dL (ref 30.0–36.0)
MCV: 83.4 fL (ref 80.0–100.0)
Monocytes Absolute: 0.7 10*3/uL (ref 0.1–1.0)
Monocytes Relative: 9 %
Neutro Abs: 4.8 10*3/uL (ref 1.7–7.7)
Neutrophils Relative %: 61 %
Platelet Count: 233 10*3/uL (ref 150–400)
RBC: 4.69 MIL/uL (ref 4.22–5.81)
RDW: 12.1 % (ref 11.5–15.5)
WBC Count: 7.9 10*3/uL (ref 4.0–10.5)
nRBC: 0 % (ref 0.0–0.2)

## 2019-12-28 LAB — CMP (CANCER CENTER ONLY)
ALT: 14 U/L (ref 0–44)
AST: 15 U/L (ref 15–41)
Albumin: 4 g/dL (ref 3.5–5.0)
Alkaline Phosphatase: 66 U/L (ref 38–126)
Anion gap: 8 (ref 5–15)
BUN: 10 mg/dL (ref 6–20)
CO2: 29 mmol/L (ref 22–32)
Calcium: 9.4 mg/dL (ref 8.9–10.3)
Chloride: 103 mmol/L (ref 98–111)
Creatinine: 0.86 mg/dL (ref 0.61–1.24)
GFR, Est AFR Am: >60 mL/min (ref >60)
GFR, Estimated: >60 mL/min (ref >60)
Glucose, Bld: 90 mg/dL (ref 70–99)
Potassium: 4 mmol/L (ref 3.5–5.1)
Sodium: 140 mmol/L (ref 135–145)
Total Bilirubin: 0.7 mg/dL (ref 0.3–1.2)
Total Protein: 7.5 g/dL (ref 6.5–8.1)

## 2019-12-28 LAB — LACTATE DEHYDROGENASE: LDH: 199 U/L — ABNORMAL HIGH (ref 98–192)

## 2019-12-28 LAB — PHOSPHORUS: Phosphorus: 3.4 mg/dL (ref 2.5–4.6)

## 2019-12-28 LAB — URIC ACID: Uric Acid, Serum: 4 mg/dL (ref 3.7–8.6)

## 2019-12-28 MED ORDER — SODIUM CHLORIDE 0.9 % IV SOLN
375.0000 mg/m2 | Freq: Once | INTRAVENOUS | Status: AC
  Administered 2019-12-28: 760 mg via INTRAVENOUS
  Filled 2019-12-28: qty 76

## 2019-12-28 MED ORDER — SODIUM CHLORIDE 0.9 % IV SOLN
10.0000 mg | Freq: Once | INTRAVENOUS | Status: AC
  Administered 2019-12-28: 10 mg via INTRAVENOUS
  Filled 2019-12-28: qty 10

## 2019-12-28 MED ORDER — PALONOSETRON HCL INJECTION 0.25 MG/5ML
INTRAVENOUS | Status: AC
  Filled 2019-12-28: qty 5

## 2019-12-28 MED ORDER — VINBLASTINE SULFATE CHEMO INJECTION 1 MG/ML
6.0000 mg/m2 | Freq: Once | INTRAVENOUS | Status: AC
  Administered 2019-12-28: 12.1 mg via INTRAVENOUS
  Filled 2019-12-28: qty 12.1

## 2019-12-28 MED ORDER — SODIUM CHLORIDE 0.9 % IV SOLN
10.0000 [IU]/m2 | Freq: Once | INTRAVENOUS | Status: AC
  Administered 2019-12-28: 20 [IU] via INTRAVENOUS
  Filled 2019-12-28: qty 6.67

## 2019-12-28 MED ORDER — SODIUM CHLORIDE 0.9% FLUSH
10.0000 mL | INTRAVENOUS | Status: DC | PRN
  Administered 2019-12-28: 10 mL via INTRAVENOUS
  Filled 2019-12-28: qty 10

## 2019-12-28 MED ORDER — HEPARIN SOD (PORK) LOCK FLUSH 100 UNIT/ML IV SOLN
500.0000 [IU] | Freq: Once | INTRAVENOUS | Status: AC | PRN
  Administered 2019-12-28: 500 [IU]
  Filled 2019-12-28: qty 5

## 2019-12-28 MED ORDER — PALONOSETRON HCL INJECTION 0.25 MG/5ML
0.2500 mg | Freq: Once | INTRAVENOUS | Status: AC
  Administered 2019-12-28: 0.25 mg via INTRAVENOUS

## 2019-12-28 MED ORDER — SODIUM CHLORIDE 0.9 % IV SOLN
Freq: Once | INTRAVENOUS | Status: AC
  Filled 2019-12-28: qty 250

## 2019-12-28 MED ORDER — DOXORUBICIN HCL CHEMO IV INJECTION 2 MG/ML
25.0000 mg/m2 | Freq: Once | INTRAVENOUS | Status: AC
  Administered 2019-12-28: 50 mg via INTRAVENOUS
  Filled 2019-12-28: qty 25

## 2019-12-28 MED ORDER — SODIUM CHLORIDE 0.9% FLUSH
10.0000 mL | INTRAVENOUS | Status: DC | PRN
  Administered 2019-12-28: 10 mL
  Filled 2019-12-28: qty 10

## 2019-12-28 MED ORDER — SODIUM CHLORIDE 0.9 % IV SOLN
150.0000 mg | Freq: Once | INTRAVENOUS | Status: AC
  Administered 2019-12-28: 150 mg via INTRAVENOUS
  Filled 2019-12-28: qty 150

## 2019-12-28 NOTE — Patient Instructions (Addendum)
Section Discharge Instructions for Patients Receiving Chemotherapy  Today you received the following chemotherapy agents: doxorubicin, vinblastine, bleomycin, dacarbazine.  To help prevent nausea and vomiting after your treatment, we encourage you to take your nausea medication as directed.   If you develop nausea and vomiting that is not controlled by your nausea medication, call the clinic.   BELOW ARE SYMPTOMS THAT SHOULD BE REPORTED IMMEDIATELY:  *FEVER GREATER THAN 100.5 F  *CHILLS WITH OR WITHOUT FEVER  NAUSEA AND VOMITING THAT IS NOT CONTROLLED WITH YOUR NAUSEA MEDICATION  *UNUSUAL SHORTNESS OF BREATH  *UNUSUAL BRUISING OR BLEEDING  TENDERNESS IN MOUTH AND THROAT WITH OR WITHOUT PRESENCE OF ULCERS  *URINARY PROBLEMS  *BOWEL PROBLEMS  UNUSUAL RASH Items with * indicate a potential emergency and should be followed up as soon as possible.  Feel free to call the clinic should you have any questions or concerns. The clinic phone number is [phone].  Please show the [facility] at check-in to the Emergency Department and triage nurse.  Doxorubicin injection What is this medicine? DOXORUBICIN (dox oh ROO bi sin) is a chemotherapy drug. It is used to treat many kinds of cancer like leukemia, lymphoma, neuroblastoma, sarcoma, and Wilms' tumor. It is also used to treat bladder cancer, breast cancer, lung cancer, ovarian cancer, stomach cancer, and thyroid cancer. This medicine may be used for other purposes; ask your health care provider or pharmacist if you have questions. COMMON BRAND NAME(S): Adriamycin, Adriamycin PFS, Adriamycin RDF, Rubex What should I tell my health care provider before I take this medicine? They need to know if you have any of these conditions:  heart disease  history of low blood counts caused by a medicine  liver disease  recent or ongoing radiation therapy  an unusual or allergic reaction to doxorubicin, other  chemotherapy agents, other medicines, foods, dyes, or preservatives  pregnant or trying to get pregnant  breast-feeding How should I use this medicine? This drug is given as an infusion into a vein. It is administered in a hospital or clinic by a specially trained health care professional. If you have pain, swelling, burning or any unusual feeling around the site of your injection, tell your health care professional right away. Talk to your pediatrician regarding the use of this medicine in children. Special care may be needed. Overdosage: If you think you have taken too much of this medicine contact a poison control center or emergency room at once. NOTE: This medicine is only for you. Do not share this medicine with others. What if I miss a dose? It is important not to miss your dose. Call your doctor or health care professional if you are unable to keep an appointment. What may interact with this medicine? This medicine may interact with the following medications:  6-mercaptopurine  paclitaxel  phenytoin  St. John's Wort  trastuzumab  verapamil This list may not describe all possible interactions. Give your health care provider a list of all the medicines, herbs, non-prescription drugs, or dietary supplements you use. Also tell them if you smoke, drink alcohol, or use illegal drugs. Some items may interact with your medicine. What should I watch for while using this medicine? This drug may make you feel generally unwell. This is not uncommon, as chemotherapy can affect healthy cells as well as cancer cells. Report any side effects. Continue your course of treatment even though you feel ill unless your doctor tells you to stop. There is a maximum  amount of this medicine you should receive throughout your life. The amount depends on the medical condition being treated and your overall health. Your doctor will watch how much of this medicine you receive in your lifetime. Tell your doctor  if you have taken this medicine before. You may need blood work done while you are taking this medicine. Your urine may turn red for a few days after your dose. This is not blood. If your urine is dark or brown, call your doctor. In some cases, you may be given additional medicines to help with side effects. Follow all directions for their use. Call your doctor or health care professional for advice if you get a fever, chills or sore throat, or other symptoms of a cold or flu. Do not treat yourself. This drug decreases your body's ability to fight infections. Try to avoid being around people who are sick. This medicine may increase your risk to bruise or bleed. Call your doctor or health care professional if you notice any unusual bleeding. Talk to your doctor about your risk of cancer. You may be more at risk for certain types of cancers if you take this medicine. Do not become pregnant while taking this medicine or for 6 months after stopping it. Women should inform their doctor if they wish to become pregnant or think they might be pregnant. Men should not father a child while taking this medicine and for 6 months after stopping it. There is a potential for serious side effects to an unborn child. Talk to your health care professional or pharmacist for more information. Do not breast-feed an infant while taking this medicine. This medicine has caused ovarian failure in some women and reduced sperm counts in some men This medicine may interfere with the ability to have a child. Talk with your doctor or health care professional if you are concerned about your fertility. This medicine may cause a decrease in Co-Enzyme Q-10. You should make sure that you get enough Co-Enzyme Q-10 while you are taking this medicine. Discuss the foods you eat and the vitamins you take with your health care professional. What side effects may I notice from receiving this medicine? Side effects that you should report to your  doctor or health care professional as soon as possible:  allergic reactions like skin rash, itching or hives, swelling of the face, lips, or tongue  breathing problems  chest pain  fast or irregular heartbeat  low blood counts - this medicine may decrease the number of white blood cells, red blood cells and platelets. You may be at increased risk for infections and bleeding.  pain, redness, or irritation at site where injected  signs of infection - fever or chills, cough, sore throat, pain or difficulty passing urine  signs of decreased platelets or bleeding - bruising, pinpoint red spots on the skin, black, tarry stools, blood in the urine  swelling of the ankles, feet, hands  tiredness  weakness Side effects that usually do not require medical attention (report to your doctor or health care professional if they continue or are bothersome):  diarrhea  hair loss  mouth sores  nail discoloration or damage  nausea  red colored urine  vomiting This list may not describe all possible side effects. Call your doctor for medical advice about side effects. You may report side effects to FDA at 1-800-FDA-1088. Where should I keep my medicine? This drug is given in a hospital or clinic and will not be stored at  home. NOTE: This sheet is a summary. It may not cover all possible information. If you have questions about this medicine, talk to your doctor, pharmacist, or health care provider.  2020 Elsevier/Gold Standard (2016-11-25 11:01:26)  Vinblastine injection What is this medicine? VINBLASTINE (vin BLAS teen) is a chemotherapy drug. It slows the growth of cancer cells. This medicine is used to treat many types of cancer like breast cancer, testicular cancer, Hodgkin's disease, non-Hodgkin's lymphoma, and sarcoma. This medicine may be used for other purposes; ask your health care provider or pharmacist if you have questions. COMMON BRAND NAME(S): Velban What should I tell my  health care provider before I take this medicine? They need to know if you have any of these conditions:  blood disorders  dental disease  gout  infection (especially a virus infection such as chickenpox, cold sores, or herpes)  liver disease  lung disease  nervous system disease  recent or ongoing radiation therapy  an unusual or allergic reaction to vinblastine, other chemotherapy agents, other medicines, foods, dyes, or preservatives  pregnant or trying to get pregnant  breast-feeding How should I use this medicine? This drug is given as an infusion into a vein. It is administered in a hospital or clinic by a specially trained health care professional. If you have pain, swelling, burning or any unusual feeling around the site of your injection, tell your health care professional right away. Talk to your pediatrician regarding the use of this medicine in children. While this drug may be prescribed for selected conditions, precautions do apply. Overdosage: If you think you have taken too much of this medicine contact a poison control center or emergency room at once. NOTE: This medicine is only for you. Do not share this medicine with others. What if I miss a dose? It is important not to miss your dose. Call your doctor or health care professional if you are unable to keep an appointment. What may interact with this medicine? Do not take this medicine with any of the following medications:  erythromycin  itraconazole  mibefradil  voriconazole This medicine may also interact with the following medications:  cyclosporine  fluconazole  ketoconazole  medicines for seizures like phenytoin  medicines to increase blood counts like filgrastim, pegfilgrastim, sargramostim  vaccines  verapamil Talk to your doctor or health care professional before taking any of these medicines:  acetaminophen  aspirin  ibuprofen  ketoprofen  naproxen This list may not  describe all possible interactions. Give your health care provider a list of all the medicines, herbs, non-prescription drugs, or dietary supplements you use. Also tell them if you smoke, drink alcohol, or use illegal drugs. Some items may interact with your medicine. What should I watch for while using this medicine? Your condition will be monitored carefully while you are receiving this medicine. You will need important blood work done while you are taking this medicine. This drug may make you feel generally unwell. This is not uncommon, as chemotherapy can affect healthy cells as well as cancer cells. Report any side effects. Continue your course of treatment even though you feel ill unless your doctor tells you to stop. In some cases, you may be given additional medicines to help with side effects. Follow all directions for their use. Call your doctor or health care professional for advice if you get a fever, chills or sore throat, or other symptoms of a cold or flu. Do not treat yourself. This drug decreases your body's ability to fight  infections. Try to avoid being around people who are sick. This medicine may increase your risk to bruise or bleed. Call your doctor or health care professional if you notice any unusual bleeding. Be careful brushing and flossing your teeth or using a toothpick because you may get an infection or bleed more easily. If you have any dental work done, tell your dentist you are receiving this medicine. Avoid taking products that contain aspirin, acetaminophen, ibuprofen, naproxen, or ketoprofen unless instructed by your doctor. These medicines may hide a fever. Do not become pregnant while taking this medicine. Women should inform their doctor if they wish to become pregnant or think they might be pregnant. There is a potential for serious side effects to an unborn child. Talk to your health care professional or pharmacist for more information. Do not breast-feed an infant  while taking this medicine. Men may have a lower sperm count while taking this medicine. Talk to your doctor if you plan to father a child. What side effects may I notice from receiving this medicine? Side effects that you should report to your doctor or health care professional as soon as possible:  allergic reactions like skin rash, itching or hives, swelling of the face, lips, or tongue  low blood counts - This drug may decrease the number of white blood cells, red blood cells and platelets. You may be at increased risk for infections and bleeding.  signs of infection - fever or chills, cough, sore throat, pain or difficulty passing urine  signs of decreased platelets or bleeding - bruising, pinpoint red spots on the skin, black, tarry stools, nosebleeds  signs of decreased red blood cells - unusually weak or tired, fainting spells, lightheadedness  breathing problems  changes in hearing  change in the amount of urine  chest pain  high blood pressure  mouth sores  nausea and vomiting  pain, swelling, redness or irritation at the injection site  pain, tingling, numbness in the hands or feet  problems with balance, dizziness  seizures Side effects that usually do not require medical attention (report to your doctor or health care professional if they continue or are bothersome):  constipation  hair loss  jaw pain  loss of appetite  sensitivity to light  stomach pain  tumor pain This list may not describe all possible side effects. Call your doctor for medical advice about side effects. You may report side effects to FDA at 1-800-FDA-1088. Where should I keep my medicine? This drug is given in a hospital or clinic and will not be stored at home. NOTE: This sheet is a summary. It may not cover all possible information. If you have questions about this medicine, talk to your doctor, pharmacist, or health care provider.  2020 Elsevier/Gold Standard (2008-01-09  17:15:59)  Bleomycin injection What is this medicine? BLEOMYCIN (blee oh MYE sin) is a chemotherapy drug. It is used to treat many kinds of cancer like lymphoma, cervical cancer, head and neck cancer, and testicular cancer. It is also used to prevent and to treat fluid build-up around the lungs caused by some cancers. This medicine may be used for other purposes; ask your health care provider or pharmacist if you have questions. COMMON BRAND NAME(S): Blenoxane What should I tell my health care provider before I take this medicine? They need to know if you have any of these conditions:  cigarette smoker  kidney disease  lung disease  recent or ongoing radiation therapy  an unusual or allergic reaction  to bleomycin, other chemotherapy agents, other medicines, foods, dyes, or preservatives  pregnant or trying to get pregnant  breast-feeding How should I use this medicine? This drug is given as an infusion into a vein or a body cavity. It can also be given as an injection into a muscle or under the skin. It is administered in a hospital or clinic by a specially trained health care professional. Talk to your pediatrician regarding the use of this medicine in children. Special care may be needed. Overdosage: If you think you have taken too much of this medicine contact a poison control center or emergency room at once. NOTE: This medicine is only for you. Do not share this medicine with others. What if I miss a dose? It is important not to miss your dose. Call your doctor or health care professional if you are unable to keep an appointment. What may interact with this medicine?  certain antibiotics given by injection  cisplatin  cyclosporine  diuretics  foscarnet  medicines to increase blood counts like filgrastim, pegfilgrastim, sargramostim  vaccines This list may not describe all possible interactions. Give your health care provider a list of all the medicines, herbs,  non-prescription drugs, or dietary supplements you use. Also tell them if you smoke, drink alcohol, or use illegal drugs. Some items may interact with your medicine. What should I watch for while using this medicine? Visit your doctor for checks on your progress. This drug may make you feel generally unwell. This is not uncommon, as chemotherapy can affect healthy cells as well as cancer cells. Report any side effects. Continue your course of treatment even though you feel ill unless your doctor tells you to stop. Call your doctor or health care professional for advice if you get a fever, chills or sore throat, or other symptoms of a cold or flu. Do not treat yourself. This drug decreases your body's ability to fight infections. Try to avoid being around people who are sick. Avoid taking products that contain aspirin, acetaminophen, ibuprofen, naproxen, or ketoprofen unless instructed by your doctor. These medicines may hide a fever. Do not become pregnant while taking this medicine. Women should inform their doctor if they wish to become pregnant or think they might be pregnant. There is a potential for serious side effects to an unborn child. Talk to your health care professional or pharmacist for more information. Do not breast-feed an infant while taking this medicine. There is a maximum amount of this medicine you should receive throughout your life. The amount depends on the medical condition being treated and your overall health. Your doctor will watch how much of this medicine you receive in your lifetime. Tell your doctor if you have taken this medicine before. What side effects may I notice from receiving this medicine? Side effects that you should report to your doctor or health care professional as soon as possible:  allergic reactions like skin rash, itching or hives, swelling of the face, lips, or tongue  breathing problems  chest pain  confusion  cough  fast, irregular  heartbeat  feeling faint or lightheaded, falls  fever or chills  mouth sores  pain, tingling, numbness in the hands or feet  trouble passing urine or change in the amount of urine  yellowing of the eyes or skin Side effects that usually do not require medical attention (report to your doctor or health care professional if they continue or are bothersome):  darker skin color  hair loss  irritation  at site where injected  loss of appetite  nail changes  nausea and vomiting  weight loss This list may not describe all possible side effects. Call your doctor for medical advice about side effects. You may report side effects to FDA at 1-800-FDA-1088. Where should I keep my medicine? This drug is given in a hospital or clinic and will not be stored at home. NOTE: This sheet is a summary. It may not cover all possible information. If you have questions about this medicine, talk to your doctor, pharmacist, or health care provider.  2020 Elsevier/Gold Standard (2012-08-09 09:36:48)  Dacarbazine, DTIC injection What is this medicine? DACARBAZINE (da KAR ba zeen) is a chemotherapy drug. This medicine is used to treat skin cancer. It is also used with other medicines to treat Hodgkin's disease. This medicine may be used for other purposes; ask your health care provider or pharmacist if you have questions. COMMON BRAND NAME(S): DTIC-Dome What should I tell my health care provider before I take this medicine? They need to know if you have any of these conditions:  infection (especially virus infection such as chickenpox, cold sores, or herpes)  kidney disease  liver disease  low blood counts like low platelets, red blood cells, white blood cells  recent radiation therapy  an unusual or allergic reaction to dacarbazine, other chemotherapy agents, other medicines, foods, dyes, or preservatives  pregnant or trying to get pregnant  breast-feeding How should I use this  medicine? This drug is given as an injection or infusion into a vein. It is administered in a hospital or clinic by a specially trained health care professional. Talk to your pediatrician regarding the use of this medicine in children. While this drug may be prescribed for selected conditions, precautions do apply. Overdosage: If you think you have taken too much of this medicine contact a poison control center or emergency room at once. NOTE: This medicine is only for you. Do not share this medicine with others. What if I miss a dose? It is important not to miss your dose. Call your doctor or health care professional if you are unable to keep an appointment. What may interact with this medicine?  medicines to increase blood counts like filgrastim, pegfilgrastim, sargramostim  vaccines This list may not describe all possible interactions. Give your health care provider a list of all the medicines, herbs, non-prescription drugs, or dietary supplements you use. Also tell them if you smoke, drink alcohol, or use illegal drugs. Some items may interact with your medicine. What should I watch for while using this medicine? Your condition will be monitored carefully while you are receiving this medicine. You will need important blood work done while you are taking this medicine. This drug may make you feel generally unwell. This is not uncommon, as chemotherapy can affect healthy cells as well as cancer cells. Report any side effects. Continue your course of treatment even though you feel ill unless your doctor tells you to stop. Call your doctor or health care professional for advice if you get a fever, chills or sore throat, or other symptoms of a cold or flu. Do not treat yourself. This drug decreases your body's ability to fight infections. Try to avoid being around people who are sick. This medicine may increase your risk to bruise or bleed. Call your doctor or health care professional if you notice  any unusual bleeding. Talk to your doctor about your risk of cancer. You may be more at risk for certain  types of cancers if you take this medicine. Do not become pregnant while taking this medicine. Women should inform their doctor if they wish to become pregnant or think they might be pregnant. There is a potential for serious side effects to an unborn child. Talk to your health care professional or pharmacist for more information. Do not breast-feed an infant while taking this medicine. What side effects may I notice from receiving this medicine? Side effects that you should report to your doctor or health care professional as soon as possible:  allergic reactions like skin rash, itching or hives, swelling of the face, lips, or tongue  low blood counts - this medicine may decrease the number of white blood cells, red blood cells and platelets. You may be at increased risk for infections and bleeding.  signs of infection - fever or chills, cough, sore throat, pain or difficulty passing urine  signs of decreased platelets or bleeding - bruising, pinpoint red spots on the skin, black, tarry stools, blood in the urine  signs of decreased red blood cells - unusually weak or tired, fainting spells, lightheadedness  breathing problems  muscle pains  pain at site where injected  trouble passing urine or change in the amount of urine  vomiting  yellowing of the eyes or skin Side effects that usually do not require medical attention (report to your doctor or health care professional if they continue or are bothersome):  diarrhea  hair loss  loss of appetite  nausea  skin more sensitive to sun or ultraviolet light  stomach upset This list may not describe all possible side effects. Call your doctor for medical advice about side effects. You may report side effects to FDA at 1-800-FDA-1088. Where should I keep my medicine? This drug is given in a hospital or clinic and will not be  stored at home. NOTE: This sheet is a summary. It may not cover all possible information. If you have questions about this medicine, talk to your doctor, pharmacist, or health care provider.  2020 [employer]vier/Gold Standard (2015-06-14 15:17:39)

## 2019-12-28 NOTE — Progress Notes (Signed)
[place] [phone]   [phone]  PROGRESS NOTE  Patient Care Team: Patient, No Pcp Per as PCP - General (General Practice) Elouise Munroe, MD as PCP - Cardiology (Cardiology)  Hematological/Oncological History # Classical Hodgkin Lymphoma, Stage I.  Favorable Disease 1) 11/17/2019: presented to the emergency department with chest pain. CXR performed showed right paratracheal density. CT recommended. CT angiogram showed diffuse enlargement of the thymus gland with associated mild mediastinal adenopathy including right paratracheal enlarged lymph node corresponding to radiographic abnormality 2) 11/19/2019: CT abdomen performed showing subtle mass versus steatosis within the caudate lobe of liver; due to history of thoracic adenopathy and thymic infiltration, follow-up non emergent MR imaging of the liver with and without contrast recommended to exclude hepatic mass 3) 11/24/2019: PET CT scan performed showing hypermetabolic mediastinal adenopathy, consistent with lymphoma. ([place] [facility]). No extrathoracic disease 4) 11/30/2019: EBUS performed, failed to provide adequate tissue for diagnosis 5) 12/08/2019: mediastinoscopy performed, biopsy confirmed Hodgkin's lymphoma.  6) 12/28/2019 Cycle 1 Day 1 of ABVD Chemotherapy   Interval History:  Martin Spencer 29 y.o. male with medical history significant for classical Hodgkin Lymphoma who presents for a follow up visit. The patient was last on 12/20/2019 to discuss treatment and options moving forward. He presents today prior to Cycle 1 Day 1 of chemotherapy.   On exam today Martin Spencer is accompanied by his mother.  He notes he has been well in the interim since her last visit.  He is completed his port placement which he notes was highly uncomfortable in the process of being placed.  He reports that he also stored semen at [facility] for cryopreservation.  He notes that he "can feel the  presence of something".  In his chest and he is not sure if this is pericarditis or mass-effect from the lymphadenopathy.  He notes he does occasionally have some shortness of breath.  He notes that he was having some night sweats the other night and does have occasional chills.  He denies having any fevers, nausea, vomiting, diarrhea, constipation, or abdominal pain.  He is ready and able to proceed with cycle 1 day 1 of ABVD chemotherapy   A full 10 point ROS is listed below.   MEDICAL HISTORY:  Past Medical History:  Diagnosis Date  . ADD (attention deficit disorder)   . Cancer ([place])   . Childhood asthma   . Pericarditis   . Shortness of breath    per patient, since recent diagnoses of pericarditis, sometimes get SOB on rest    SURGICAL HISTORY: Past Surgical History:  Procedure Laterality Date  . APPENDECTOMY    . FINE NEEDLE ASPIRATION  11/30/2019   Procedure: FINE NEEDLE ASPIRATION (FNA) LINEAR;  Surgeon: Candee Furbish, MD;  Location: [facility] ENDOSCOPY;  Service: Pulmonary;;  . IR IMAGING GUIDED PORT INSERTION  12/26/2019  . LAPAROSCOPIC APPENDECTOMY N/A 11/14/2017   Procedure: APPENDECTOMY LAPAROSCOPIC;  Surgeon: Georganna Skeans, MD;  Location: [place];  Service: General;  Laterality: N/A;  . MEDIASTINOSCOPY N/A 12/08/2019   Procedure: MEDIASTINOSCOPY;  Surgeon: Melrose Nakayama, MD;  Location: [place];  Service: Thoracic;  Laterality: N/A;  . VIDEO BRONCHOSCOPY WITH ENDOBRONCHIAL ULTRASOUND N/A 11/30/2019   Procedure: VIDEO BRONCHOSCOPY WITH ENDOBRONCHIAL ULTRASOUND;  Surgeon: Candee Furbish, MD;  Location: [facility] ENDOSCOPY;  Service: Pulmonary;  Laterality: N/A;  . WISDOM TOOTH EXTRACTION      SOCIAL HISTORY: Social History   Socioeconomic History  . Marital status:  Single    Spouse name: Not on file  . Number of children: Not on file  . Years of education: Not on file  . Highest education level: Not on file  Occupational History  . Not on file  Tobacco Use  . Smoking status:  Former Smoker    Types: Cigarettes  . Smokeless tobacco: Never Used  . Tobacco comment: quit in high school  Vaping Use  . Vaping Use: Never used  Substance and Sexual Activity  . Alcohol use: Yes    Comment: per patient "rarely"  . Drug use: Not Currently    Types: Marijuana    Comment: CBD last use 2 weeks ago ( 11/29/19)  . Sexual activity: Not on file  Other Topics Concern  . Not on file  Social History Narrative  . Not on file   Social Determinants of Health   Financial Resource Strain:   . Difficulty of Paying Living Expenses: Not on file  Food Insecurity:   . Worried About Charity fundraiser in the Last Year: Not on file  . Ran Out of Food in the Last Year: Not on file  Transportation Needs:   . Lack of Transportation (Medical): Not on file  . Lack of Transportation (Non-Medical): Not on file  Physical Activity:   . Days of Exercise per Week: Not on file  . Minutes of Exercise per Session: Not on file  Stress:   . Feeling of Stress : Not on file  Social Connections:   . Frequency of Communication with Friends and Family: Not on file  . Frequency of Social Gatherings with Friends and Family: Not on file  . Attends Religious Services: Not on file  . Active Member of Clubs or Organizations: Not on file  . Attends Archivist Meetings: Not on file  . Marital Status: Not on file  Intimate Partner Violence:   . Fear of Current or Ex-Partner: Not on file  . Emotionally Abused: Not on file  . Physically Abused: Not on file  . Sexually Abused: Not on file    FAMILY HISTORY: Family History  Problem Relation Age of Onset  . Kidney cancer Father   . Colon cancer Paternal Grandfather     ALLERGIES:  is allergic to other.  MEDICATIONS:  Current Outpatient Medications  Medication Sig Dispense Refill  . lidocaine-prilocaine (EMLA) cream Apply 1 application topically as needed. 30 g 1  . omeprazole (PRILOSEC) 40 MG capsule Take 1 capsule (40 mg total) by  mouth daily. 30 capsule 0  . ondansetron (ZOFRAN) 8 MG tablet Take 1 tablet (8 mg total) by mouth every 8 (eight) hours as needed for nausea or vomiting. 20 tablet 1  . oxyCODONE (OXY IR/ROXICODONE) 5 MG immediate release tablet Take 1-2 tablets (5-10 mg total) by mouth every 6 (six) hours as needed for moderate pain or severe pain. (Patient not taking: Reported on 12/18/2019) 30 tablet 0  . prochlorperazine (COMPAZINE) 10 MG tablet Take 1 tablet (10 mg total) by mouth every 6 (six) hours as needed for nausea or vomiting. 30 tablet 1   No current facility-administered medications for this visit.   Facility-Administered Medications Ordered in Other Visits  Medication Dose Route Frequency Provider Last Rate Last Admin  . sodium chloride flush (NS) 0.9 % injection 10 mL  10 mL Intracatheter PRN Orson Slick, MD   10 mL at 12/28/19 1744    REVIEW OF SYSTEMS:   Constitutional: ( - ) fevers, ( - )  chills , ( - ) night sweats Eyes: ( - ) blurriness of vision, ( - ) double vision, ( - ) watery eyes Ears, nose, mouth, throat, and face: ( - ) mucositis, ( - ) sore throat Respiratory: ( - ) cough, ( - ) dyspnea, ( - ) wheezes Cardiovascular: ( - ) palpitation, ( - ) chest discomfort, ( - ) lower extremity swelling Gastrointestinal:  ( - ) nausea, ( - ) heartburn, ( - ) change in bowel habits Skin: ( - ) abnormal skin rashes Lymphatics: ( - ) new lymphadenopathy, ( - ) easy bruising Neurological: ( - ) numbness, ( - ) tingling, ( - ) new weaknesses Behavioral/Psych: ( - ) mood change, ( - ) new changes  All other systems were reviewed with the patient and are negative.  PHYSICAL EXAMINATION: ECOG PERFORMANCE STATUS: 0 - Asymptomatic  Vitals:   12/28/19 1322  BP: 130/86  Pulse: 82  Resp: 18  Temp: (!) 97.4 F (36.3 C)  SpO2: 99%   Filed Weights   12/28/19 1322  Weight: 183 lb 8 oz (83.2 kg)    GENERAL: well appearing fit young Caucasian male. alert, no distress and  comfortable SKIN: skin color, texture, turgor are normal, no rashes or significant lesions. Small surgical incision at base of neck, well healing.  EYES: conjunctiva are pink and non-injected, sclera clear LUNGS: clear to auscultation and percussion with normal breathing effort HEART: regular rate & rhythm and no murmurs and no lower extremity edema Musculoskeletal: no cyanosis of digits and no clubbing  PSYCH: alert & oriented x 3, fluent speech NEURO: no focal motor/sensory deficits  LABORATORY DATA:  I have reviewed the data as listed CBC Latest Ref Rng & Units 12/28/2019 12/26/2019 12/06/2019  WBC 4.0 - 10.5 K/uL 7.9 7.6 7.1  Hemoglobin 13.0 - 17.0 g/dL 13.7 14.5 15.5  Hematocrit 39 - 52 % 39.1 41.5 45.4  Platelets 150 - 400 K/uL 233 255 261    CMP Latest Ref Rng & Units 12/28/2019 12/06/2019 11/30/2019  Glucose 70 - 99 mg/dL 90 90 83  BUN 6 - 20 mg/dL 10 13 14   Creatinine 0.61 - 1.24 mg/dL 0.86 0.71 0.85  Sodium 135 - 145 mmol/L 140 139 137  Potassium 3.5 - 5.1 mmol/L 4.0 4.1 5.2(H)  Chloride 98 - 111 mmol/L 103 103 104  CO2 22 - 32 mmol/L 29 25 23   Calcium 8.9 - 10.3 mg/dL 9.4 9.8 9.5  Total Protein 6.5 - 8.1 g/dL 7.5 7.4 7.2  Total Bilirubin 0.3 - 1.2 mg/dL 0.7 1.5(H) 1.8(H)  Alkaline Phos 38 - 126 U/L 66 58 51  AST 15 - 41 U/L 15 22 45(H)  ALT 0 - 44 U/L 14 28 20     RADIOGRAPHIC STUDIES: I have personally review the images and agree with the findings below: marked FDG avid mediastinal lymph nodes. Mild uptake under left arm (prior COVID vaccine). No disease noted elsewhere  DG Chest 2 View  Result Date: 12/06/2019 CLINICAL DATA:  Pre admit for lung BX,,Mediastinal adenopathy Asthma at young age,,former smoker EXAM: CHEST - 2 VIEW COMPARISON:  Chest radiograph 11/17/2019 FINDINGS: Stable cardiomediastinal contours including right paratracheal convexity. The lungs are clear. No pneumothorax or pleural effusion. No acute finding in the visualized skeleton. IMPRESSION: 1. No acute  cardiopulmonary process. 2. Stable right paratracheal convexity corresponding to known adenopathy. Electronically Signed   By: Audie Pinto M.D.   On: 12/06/2019 15:56   IR IMAGING GUIDED PORT INSERTION  Result  Date: 12/26/2019 CLINICAL DATA:  HODGKIN'S LYMPHOMA EXAM: RIGHT INTERNAL JUGULAR SINGLE LUMEN POWER PORT CATHETER INSERTION Date:  12/26/2019 12/26/2019 3:32 pm Radiologist:  Jerilynn Mages. Daryll Brod, MD Guidance:  Ultrasound and fluoroscopic MEDICATIONS: Ancef 2 g; The antibiotic was administered within an appropriate time interval prior to skin puncture. ANESTHESIA/SEDATION: Versed 6.0 mg IV; Fentanyl 200 mcg IV; Moderate Sedation Time:  30 minutes The patient was continuously monitored during the procedure by the interventional radiology nurse under my direct supervision. FLUOROSCOPY TIME:  0 minutes, 30 seconds (3.0 mGy) COMPLICATIONS: None immediate. CONTRAST:  None. PROCEDURE: Informed consent was obtained from the patient following explanation of the procedure, risks, benefits and alternatives. The patient understands, agrees and consents for the procedure. All questions were addressed. A time out was performed. Maximal barrier sterile technique utilized including caps, mask, sterile gowns, sterile gloves, large sterile drape, hand hygiene, and 2% chlorhexidine scrub. Under sterile conditions and local anesthesia, right internal jugular micropuncture venous access was performed. Access was performed with ultrasound. Images were obtained for documentation of the patent right internal jugular vein. A guide wire was inserted followed by a transitional dilator. This allowed insertion of a guide wire and catheter into the IVC. Measurements were obtained from the SVC / RA junction back to the right IJ venotomy site. In the right infraclavicular chest, a subcutaneous pocket was created over the second anterior rib. This was done under sterile conditions and local anesthesia. 1% lidocaine with epinephrine was  utilized for this. A 2.5 cm incision was made in the skin. Blunt dissection was performed to create a subcutaneous pocket over the right pectoralis major muscle. The pocket was flushed with saline vigorously. There was adequate hemostasis. The port catheter was assembled and checked for leakage. The port catheter was secured in the pocket with two retention sutures. The tubing was tunneled subcutaneously to the right venotomy site and inserted into the SVC/RA junction through a valved peel-away sheath. Position was confirmed with fluoroscopy. Images were obtained for documentation. The patient tolerated the procedure well. No immediate complications. Incisions were closed in a two layer fashion with 4 - 0 Vicryl suture. Dermabond was applied to the skin. The port catheter was accessed, blood was aspirated followed by saline and heparin flushes. Needle was removed. A dry sterile dressing was applied. IMPRESSION: Ultrasound and fluoroscopically guided right internal jugular single lumen power port catheter insertion. Tip in the SVC/RA junction. Catheter ready for use. Electronically Signed   By: Jerilynn Mages.  Shick M.D.   On: 12/26/2019 15:45    ASSESSMENT & PLAN Martin Spencer 29 y.o. male with medical history significant for classical Hodgkin Lymphoma stage I who presents for a follow up visit. Today is Cycle 1 Day 1 of chemotherapy.   On exam today Martin Spencer is otherwise well with the exception of some discomfort in his chest.  He is having some sweats and chills, but is not having any frank fevers.  He is otherwise willing and able to proceed with cycle 1 day 1 of chemotherapy today.  We discussed the signs and symptoms again for what to watch out for and assured that he had completed all of the necessary steps prior to beginning chemotherapy.  Previously we discussed the patient has a stage I classical Hodgkin's lymphoma, favorable disease.  He is young and otherwise fit and therefore would be able to  tolerate full ABVD chemotherapy.  We discussed expected side effects including nausea, vomiting, constipation, nerve damage, fertility loss, hair loss, and possible  secondary malignancy.  The patient and his parents voiced their understanding of the reasoning behind obtaining PFTs, chemotherapy education, port placement, and fertility preservation at [facility].  They have the opportunity to ask all questions and concerns regarding treatment moving forward.  The regimen of choice for this stage I classical Hodgkin's lymphoma, favorable disease would consist of ABVD chemotherapy.  ABVD is comprised of doxorubicin 25 mg/m IV, bleomycin 10 units/m IV, vinblastine 6 g/m IV, and dacarbazine 375 mg/m IV all to be administered on days 1 and 15 of a 28-day cycle.  After 2 cycles the patient is to undergo a PET CT scan at which time treatment moving forward can be dictated by response to therapy.  # Classical Hodgkin Lymphoma, Stage I.  Favorable Disease.  --Cycle 1 Day 1 ABVD chemotherapy (noted above) to start today (12/28/2019)  --patient has completed PFTs, Port, and fertility preservation. TTE performed on 11/18/2019 (EF 65%)  --discussed the risks and benefits of treatment. Additionally noted that we will re-evaluate with a PET CT scan after 4 doses (2 cycles) of chemotherapy.  --Will have patient return to clinic q 2 weeks with continued chemotherapy.   # Symptom Management -- prescribed zofran 8mg  PO q8H PRN and compazine 10mg  q6H PRN.  --EMLA cream for port -- assure daily BM while on therapy with vincristine. Assure patient has senna docusate and miralax on hand during treatment.  --strict return precautions for shortness of breath, cough, fevers.   No orders of the defined types were placed in this encounter.  All questions were answered. The patient knows to call the clinic with any problems, questions or concerns.  A total of more than 30 minutes were spent on this  encounter and over half of that time was spent on counseling and coordination of care as outlined above.   Ledell Peoples, MD Department of Hematology/Oncology [place] at [facility] Phone: [phone] Pager: [phone] Email: [email or web address]  12/28/2019 9:07 PM

## 2019-12-29 ENCOUNTER — Telehealth: Payer: Self-pay | Admitting: *Deleted

## 2019-12-29 NOTE — Telephone Encounter (Signed)
Received call from patient this afternoon. He states that overall he feels pretty well after his 1st treatment with ABVD. He states he is feeling a bit nauseated and wanted to confirm which of his nausea meds is the appropriate one to take .  Reviewed his meds and advised that his compazine is the appropriate one to take today. Advised he can take the Zofran after Sunday he need it and the Compazine is not working. Demarie voiced  Understanding to the above instructions.

## 2020-01-02 ENCOUNTER — Encounter: Payer: Self-pay | Admitting: Hematology and Oncology

## 2020-01-08 DIAGNOSIS — C8190 Hodgkin lymphoma, unspecified, unspecified site: Secondary | ICD-10-CM | POA: Diagnosis not present

## 2020-01-09 ENCOUNTER — Other Ambulatory Visit: Payer: Self-pay | Admitting: Hematology and Oncology

## 2020-01-09 DIAGNOSIS — C8112 Nodular sclerosis classical Hodgkin lymphoma, intrathoracic lymph nodes: Secondary | ICD-10-CM

## 2020-01-09 NOTE — Progress Notes (Signed)
[place] [phone]   [phone]  PROGRESS NOTE  Patient Care Team: Patient, No Pcp Per as PCP - General (General Practice) Elouise Munroe, MD as PCP - Cardiology (Cardiology)  Hematological/Oncological History # Classical Hodgkin Lymphoma, Stage I.  Favorable Disease 1) 11/17/2019: presented to the emergency department with chest pain. CXR performed showed right paratracheal density. CT recommended. CT angiogram showed diffuse enlargement of the thymus gland with associated mild mediastinal adenopathy including right paratracheal enlarged lymph node corresponding to radiographic abnormality 2) 11/19/2019: CT abdomen performed showing subtle mass versus steatosis within the caudate lobe of liver; due to history of thoracic adenopathy and thymic infiltration, follow-up non emergent MR imaging of the liver with and without contrast recommended to exclude hepatic mass 3) 11/24/2019: PET CT scan performed showing hypermetabolic mediastinal adenopathy, consistent with lymphoma. ([place] [facility]). No extrathoracic disease 4) 11/30/2019: EBUS performed, failed to provide adequate tissue for diagnosis 5) 12/08/2019: mediastinoscopy performed, biopsy confirmed Hodgkin's lymphoma.  6) 12/28/2019 Cycle 1 Day 1 of ABVD Chemotherapy  7) 01/10/2020: Cycle 1 Day 15 of ABVD Chemotherapy   Interval History:  Martin Spencer 29 y.o. male with medical history significant for classical Hodgkin Lymphoma who presents for a follow up visit. The patient was last on 12/28/2019 to start Cycle 1 of treatment. He presents today prior to Cycle 1 Day 15 of chemotherapy.   On exam today Martin Spencer is accompanied by his father.  He notes that he tolerated the first cycle of chemotherapy well and it was "not as bad as he thought it would be".  He notes he has not noticed anything out of the ordinary since his last visit.  He reports that his shortness of breath and chest discomfort has  actually improved in the interval since his last visit.  He notes he is not having any chest pain.  He denies fevers, chills, sweats, nausea, vomiting or diarrhea.  A full 10 point ROS is listed below.  MEDICAL HISTORY:  Past Medical History:  Diagnosis Date  . ADD (attention deficit disorder)   . Cancer ([place])   . Childhood asthma   . Pericarditis   . Shortness of breath    per patient, since recent diagnoses of pericarditis, sometimes get SOB on rest   SURGICAL HISTORY: Past Surgical History:  Procedure Laterality Date  . APPENDECTOMY    . FINE NEEDLE ASPIRATION  11/30/2019   Procedure: FINE NEEDLE ASPIRATION (FNA) LINEAR;  Surgeon: Candee Furbish, MD;  Location[facility] ENDOSCOPY;  Service: Pulmonary;;  . IR IMAGING GUIDED PORT INSERTION  12/26/2019  . LAPAROSCOPIC APPENDECTOMY N/A 11/14/2017   Procedure: APPENDECTOMY LAPAROSCOPIC;  Surgeon: Georganna Skeans, MD;  Location: [place];  Service: General;  Laterality: N/A;  . MEDIASTINOSCOPY N/A 12/08/2019   Procedure: MEDIASTINOSCOPY;  Surgeon: Melrose Nakayama, MD;  Location: [place];  Service: Thoracic;  Laterality: N/A;  . VIDEO BRONCHOSCOPY WITH ENDOBRONCHIAL ULTRASOUND N/A 11/30/2019   Procedure: VIDEO BRONCHOSCOPY WITH ENDOBRONCHIAL ULTRASOUND;  Surgeon: Candee Furbish, MD;  Location: [facility] ENDOSCOPY;  Service: Pulmonary;  Laterality: N/A;  . WISDOM TOOTH EXTRACTION      SOCIAL HISTORY: Social History   Socioeconomic History  . Marital status: Single    Spouse name: Not on file  . Number of children: Not on file  . Years of education: Not on file  . Highest education level: Not on file  Occupational History  . Not on file  Tobacco Use  . Smoking status: Former  Smoker    Types: Cigarettes  . Smokeless tobacco: Never Used  . Tobacco comment: quit in high school  Vaping Use  . Vaping Use: Never used  Substance and Sexual Activity  . Alcohol use: Yes    Comment: per patient "rarely"  . Drug use: Not Currently    Types: Marijuana     Comment: CBD last use 2 weeks ago ( 11/29/19)  . Sexual activity: Not on file  Other Topics Concern  . Not on file  Social History Narrative  . Not on file   Social Determinants of Health   Financial Resource Strain:   . Difficulty of Paying Living Expenses: Not on file  Food Insecurity:   . Worried About Charity fundraiser in the Last Year: Not on file  . Ran Out of Food in the Last Year: Not on file  Transportation Needs:   . Lack of Transportation (Medical): Not on file  . Lack of Transportation (Non-Medical): Not on file  Physical Activity:   . Days of Exercise per Week: Not on file  . Minutes of Exercise per Session: Not on file  Stress:   . Feeling of Stress : Not on file  Social Connections:   . Frequency of Communication with Friends and Family: Not on file  . Frequency of Social Gatherings with Friends and Family: Not on file  . Attends Religious Services: Not on file  . Active Member of Clubs or Organizations: Not on file  . Attends Archivist Meetings: Not on file  . Marital Status: Not on file  Intimate Partner Violence:   . Fear of Current or Ex-Partner: Not on file  . Emotionally Abused: Not on file  . Physically Abused: Not on file  . Sexually Abused: Not on file    FAMILY HISTORY: Family History  Problem Relation Age of Onset  . Kidney cancer Father   . Colon cancer Paternal Grandfather     ALLERGIES:  is allergic to other.  MEDICATIONS:  Current Outpatient Medications  Medication Sig Dispense Refill  . lidocaine-prilocaine (EMLA) cream Apply 1 application topically as needed. 30 g 1  . omeprazole (PRILOSEC) 40 MG capsule Take 1 capsule (40 mg total) by mouth daily. 30 capsule 0  . ondansetron (ZOFRAN) 8 MG tablet Take 1 tablet (8 mg total) by mouth every 8 (eight) hours as needed for nausea or vomiting. 20 tablet 1  . oxyCODONE (OXY IR/ROXICODONE) 5 MG immediate release tablet Take 1-2 tablets (5-10 mg total) by mouth every 6 (six)  hours as needed for moderate pain or severe pain. (Patient not taking: Reported on 12/18/2019) 30 tablet 0  . prochlorperazine (COMPAZINE) 10 MG tablet Take 1 tablet (10 mg total) by mouth every 6 (six) hours as needed for nausea or vomiting. 30 tablet 1   No current facility-administered medications for this visit.   Facility-Administered Medications Ordered in Other Visits  Medication Dose Route Frequency Provider Last Rate Last Admin  . bleomycin (BLEOCIN) 20 Units in sodium chloride 0.9 % 50 mL chemo infusion  10 Units/m2 (Treatment Plan Recorded) Intravenous Once Ledell Peoples IV, MD      . dacarbazine (DTIC) 760 mg in sodium chloride 0.9 % 250 mL chemo infusion  375 mg/m2 (Treatment Plan Recorded) Intravenous Once Narda Rutherford T IV, MD      . heparin lock flush 100 unit/mL  500 Units Intracatheter Once PRN Orson Slick, MD      . sodium chloride  flush (NS) 0.9 % injection 10 mL  10 mL Intracatheter PRN Orson Slick, MD        REVIEW OF SYSTEMS:   Constitutional: ( - ) fevers, ( - )  chills , ( - ) night sweats Eyes: ( - ) blurriness of vision, ( - ) double vision, ( - ) watery eyes Ears, nose, mouth, throat, and face: ( - ) mucositis, ( - ) sore throat Respiratory: ( - ) cough, ( - ) dyspnea, ( - ) wheezes Cardiovascular: ( - ) palpitation, ( - ) chest discomfort, ( - ) lower extremity swelling Gastrointestinal:  ( - ) nausea, ( - ) heartburn, ( - ) change in bowel habits Skin: ( - ) abnormal skin rashes Lymphatics: ( - ) new lymphadenopathy, ( - ) easy bruising Neurological: ( - ) numbness, ( - ) tingling, ( - ) new weaknesses Behavioral/Psych: ( - ) mood change, ( - ) new changes  All other systems were reviewed with the patient and are negative.  PHYSICAL EXAMINATION: ECOG PERFORMANCE STATUS: 0 - Asymptomatic  Vitals:   01/10/20 1118  BP: 105/70  Pulse: 81  Resp: 18  Temp: (!) 96.9 F (36.1 C)  SpO2: 100%   Filed Weights   01/10/20 1118  Weight: 179 lb 14.4  oz (81.6 kg)    GENERAL: well appearing fit young Caucasian male. alert, no distress and comfortable SKIN: skin color, texture, turgor are normal, no rashes or significant lesions. Small surgical incision at base of neck, well healing.  EYES: conjunctiva are pink and non-injected, sclera clear LUNGS: clear to auscultation and percussion with normal breathing effort HEART: regular rate & rhythm and no murmurs and no lower extremity edema Musculoskeletal: no cyanosis of digits and no clubbing  PSYCH: alert & oriented x 3, fluent speech NEURO: no focal motor/sensory deficits  LABORATORY DATA:  I have reviewed the data as listed CBC Latest Ref Rng & Units 01/10/2020 12/28/2019 12/26/2019  WBC 4.0 - 10.5 K/uL 3.3(L) 7.9 7.6  Hemoglobin 13.0 - 17.0 g/dL 14.2 13.7 14.5  Hematocrit 39 - 52 % 40.3 39.1 41.5  Platelets 150 - 400 K/uL 272 233 255    CMP Latest Ref Rng & Units 01/10/2020 12/28/2019 12/06/2019  Glucose 70 - 99 mg/dL 84 90 90  BUN 6 - 20 mg/dL 12 10 13   Creatinine 0.61 - 1.24 mg/dL 0.93 0.86 0.71  Sodium 135 - 145 mmol/L 139 140 139  Potassium 3.5 - 5.1 mmol/L 3.8 4.0 4.1  Chloride 98 - 111 mmol/L 105 103 103  CO2 22 - 32 mmol/L 28 29 25   Calcium 8.9 - 10.3 mg/dL 9.5 9.4 9.8  Total Protein 6.5 - 8.1 g/dL 7.6 7.5 7.4  Total Bilirubin 0.3 - 1.2 mg/dL 1.3(H) 0.7 1.5(H)  Alkaline Phos 38 - 126 U/L 61 66 58  AST 15 - 41 U/L 21 15 22   ALT 0 - 44 U/L 34 14 28    RADIOGRAPHIC STUDIES: I have personally review the images and agree with the findings below: marked FDG avid mediastinal lymph nodes. Mild uptake under left arm (prior COVID vaccine). No disease noted elsewhere  IR IMAGING GUIDED PORT INSERTION  Result Date: 12/26/2019 CLINICAL DATA:  HODGKIN'S LYMPHOMA EXAM: RIGHT INTERNAL JUGULAR SINGLE LUMEN POWER PORT CATHETER INSERTION Date:  12/26/2019 12/26/2019 3:32 pm Radiologist:  Jerilynn Mages. Daryll Brod, MD Guidance:  Ultrasound and fluoroscopic MEDICATIONS: Ancef 2 g; The antibiotic was  administered within an appropriate time interval prior to  skin puncture. ANESTHESIA/SEDATION: Versed 6.0 mg IV; Fentanyl 200 mcg IV; Moderate Sedation Time:  30 minutes The patient was continuously monitored during the procedure by the interventional radiology nurse under my direct supervision. FLUOROSCOPY TIME:  0 minutes, 30 seconds (3.0 mGy) COMPLICATIONS: None immediate. CONTRAST:  None. PROCEDURE: Informed consent was obtained from the patient following explanation of the procedure, risks, benefits and alternatives. The patient understands, agrees and consents for the procedure. All questions were addressed. A time out was performed. Maximal barrier sterile technique utilized including caps, mask, sterile gowns, sterile gloves, large sterile drape, hand hygiene, and 2% chlorhexidine scrub. Under sterile conditions and local anesthesia, right internal jugular micropuncture venous access was performed. Access was performed with ultrasound. Images were obtained for documentation of the patent right internal jugular vein. A guide wire was inserted followed by a transitional dilator. This allowed insertion of a guide wire and catheter into the IVC. Measurements were obtained from the SVC / RA junction back to the right IJ venotomy site. In the right infraclavicular chest, a subcutaneous pocket was created over the second anterior rib. This was done under sterile conditions and local anesthesia. 1% lidocaine with epinephrine was utilized for this. A 2.5 cm incision was made in the skin. Blunt dissection was performed to create a subcutaneous pocket over the right pectoralis major muscle. The pocket was flushed with saline vigorously. There was adequate hemostasis. The port catheter was assembled and checked for leakage. The port catheter was secured in the pocket with two retention sutures. The tubing was tunneled subcutaneously to the right venotomy site and inserted into the SVC/RA junction through a valved  peel-away sheath. Position was confirmed with fluoroscopy. Images were obtained for documentation. The patient tolerated the procedure well. No immediate complications. Incisions were closed in a two layer fashion with 4 - 0 Vicryl suture. Dermabond was applied to the skin. The port catheter was accessed, blood was aspirated followed by saline and heparin flushes. Needle was removed. A dry sterile dressing was applied. IMPRESSION: Ultrasound and fluoroscopically guided right internal jugular single lumen power port catheter insertion. Tip in the SVC/RA junction. Catheter ready for use. Electronically Signed   By: Jerilynn Mages.  Shick M.D.   On: 12/26/2019 15:45    ASSESSMENT & PLAN Martin Spencer 29 y.o. male with medical history significant for classical Hodgkin Lymphoma stage I who presents for a follow up visit. Today is Cycle 1 Day 15 of chemotherapy.   On exam today Martin Spencer is symptomatically doing well other than some mild fatigue.  He does unfortunately have an [place] of 600 which is considerably lower than what we are hoping for.  After discussion with pharmacy and discussion with the patient we agreed to proceed with ABVD chemotherapy and provide the patient with G-CSF support starting tomorrow.  I discussed with the patient the risks of being neutropenic and the precautions he should take in the event he developed fevers, chills, sweats, or other infectious symptoms.  I also stressed to him the importance of hand washing, wearing masks, and avoiding unnecessary contact with others.   Previously we discussed the patient has a stage I classical Hodgkin's lymphoma, favorable disease.  He is young and otherwise fit and therefore would be able to tolerate full ABVD chemotherapy.  We discussed expected side effects including nausea, vomiting, constipation, nerve damage, fertility loss, hair loss, and possible secondary malignancy.  The patient and his parents voiced their understanding of the reasoning behind  obtaining PFTs, chemotherapy education,  port placement, and fertility preservation at [facility].  They have the opportunity to ask all questions and concerns regarding treatment moving forward.  The regimen of choice for this stage I classical Hodgkin's lymphoma, favorable disease would consist of ABVD chemotherapy.  ABVD is comprised of doxorubicin 25 mg/m IV, bleomycin 10 units/m IV, vinblastine 6 g/m IV, and dacarbazine 375 mg/m IV all to be administered on days 1 and 15 of a 28-day cycle.  After 2 cycles the patient is to undergo a PET CT scan at which time treatment moving forward can be dictated by response to therapy.  # Classical Hodgkin Lymphoma, Stage I.  Favorable Disease.  --Cycle 1 Day 15 ABVD chemotherapy (noted above) to start today (01/10/2020)  --patient has completed PFTs, Port, and fertility preservation. TTE performed on 11/18/2019 (EF 65%)  --discussed the risks and benefits of treatment. Additionally noted that we will re-evaluate with a PET CT scan after 4 doses (2 cycles) of chemotherapy.  --Will have patient return to clinic q 2 weeks with continued chemotherapy.   #Neutropenia -ANC 600 today, decreased from normal level prior --will proceed with chemotherapy today after we discussed the risks and benefits. He was agreeable to proceeding with treatment --will add GCSF support to the current cycle and subsequent cycles  # Symptom Management -- prescribed zofran 8mg  PO q8H PRN and compazine 10mg  q6H PRN.  --EMLA cream for port -- assure daily BM while on therapy with vincristine. Assure patient has senna docusate and miralax on hand during treatment.  --strict return precautions for shortness of breath, cough, fevers.   No orders of the defined types were placed in this encounter.  All questions were answered. The patient knows to call the clinic with any problems, questions or concerns.  A total of more than 30 minutes were spent on this encounter  and over half of that time was spent on counseling and coordination of care as outlined above.   Ledell Peoples, MD Department of Hematology/Oncology [place] [facility] Phone: [phone] Pager: [phone] Email: [email or web address]  01/10/2020 2:42 PM   Literature Support:  Sharion Dove, et al. The use of filgrastim in patients with Hodgkin lymphoma receiving ABVD. Int J Hematol Oncol Stem Cell Res. 2017;11(4):286-292.  --This study does not find evidence that the combination of bleomycin and G-CSF increases the risk for bleomycin- induced pulmonary toxicity. We recommend G-CSF use in HL patients receiving bleomycin when needed to maintain dose intensity.

## 2020-01-10 ENCOUNTER — Inpatient Hospital Stay: Payer: BC Managed Care – PPO

## 2020-01-10 ENCOUNTER — Inpatient Hospital Stay (HOSPITAL_BASED_OUTPATIENT_CLINIC_OR_DEPARTMENT_OTHER): Payer: BC Managed Care – PPO | Admitting: Hematology and Oncology

## 2020-01-10 ENCOUNTER — Encounter: Payer: Self-pay | Admitting: Hematology and Oncology

## 2020-01-10 ENCOUNTER — Other Ambulatory Visit: Payer: Self-pay

## 2020-01-10 ENCOUNTER — Other Ambulatory Visit: Payer: BC Managed Care – PPO

## 2020-01-10 VITALS — BP 105/70 | HR 81 | Temp 96.9°F | Resp 18 | Ht 71.0 in | Wt 179.9 lb

## 2020-01-10 DIAGNOSIS — Z23 Encounter for immunization: Secondary | ICD-10-CM | POA: Diagnosis not present

## 2020-01-10 DIAGNOSIS — C8112 Nodular sclerosis classical Hodgkin lymphoma, intrathoracic lymph nodes: Secondary | ICD-10-CM | POA: Diagnosis not present

## 2020-01-10 DIAGNOSIS — Z95828 Presence of other vascular implants and grafts: Secondary | ICD-10-CM

## 2020-01-10 DIAGNOSIS — Z5111 Encounter for antineoplastic chemotherapy: Secondary | ICD-10-CM | POA: Diagnosis not present

## 2020-01-10 DIAGNOSIS — D709 Neutropenia, unspecified: Secondary | ICD-10-CM | POA: Diagnosis not present

## 2020-01-10 LAB — CMP (CANCER CENTER ONLY)
ALT: 34 U/L (ref 0–44)
AST: 21 U/L (ref 15–41)
Albumin: 4.3 g/dL (ref 3.5–5.0)
Alkaline Phosphatase: 61 U/L (ref 38–126)
Anion gap: 6 (ref 5–15)
BUN: 12 mg/dL (ref 6–20)
CO2: 28 mmol/L (ref 22–32)
Calcium: 9.5 mg/dL (ref 8.9–10.3)
Chloride: 105 mmol/L (ref 98–111)
Creatinine: 0.93 mg/dL (ref 0.61–1.24)
GFR, Est AFR Am: >60 mL/min (ref >60)
GFR, Estimated: >60 mL/min (ref >60)
Glucose, Bld: 84 mg/dL (ref 70–99)
Potassium: 3.8 mmol/L (ref 3.5–5.1)
Sodium: 139 mmol/L (ref 135–145)
Total Bilirubin: 1.3 mg/dL — ABNORMAL HIGH (ref 0.3–1.2)
Total Protein: 7.6 g/dL (ref 6.5–8.1)

## 2020-01-10 LAB — CBC WITH DIFFERENTIAL (CANCER CENTER ONLY)
Abs Immature Granulocytes: 0 10*3/uL (ref 0.00–0.07)
Basophils Absolute: 0 10*3/uL (ref 0.0–0.1)
Basophils Relative: 1 %
Eosinophils Absolute: 0.1 10*3/uL (ref 0.0–0.5)
Eosinophils Relative: 2 %
HCT: 40.3 % (ref 39.0–52.0)
Hemoglobin: 14.2 g/dL (ref 13.0–17.0)
Immature Granulocytes: 0 %
Lymphocytes Relative: 67 %
Lymphs Abs: 2.2 10*3/uL (ref 0.7–4.0)
MCH: 28.7 pg (ref 26.0–34.0)
MCHC: 35.2 g/dL (ref 30.0–36.0)
MCV: 81.4 fL (ref 80.0–100.0)
Monocytes Absolute: 0.3 10*3/uL (ref 0.1–1.0)
Monocytes Relative: 10 %
Neutro Abs: 0.6 10*3/uL — ABNORMAL LOW (ref 1.7–7.7)
Neutrophils Relative %: 20 %
Platelet Count: 272 10*3/uL (ref 150–400)
RBC: 4.95 MIL/uL (ref 4.22–5.81)
RDW: 12.7 % (ref 11.5–15.5)
WBC Count: 3.3 10*3/uL — ABNORMAL LOW (ref 4.0–10.5)
nRBC: 0 % (ref 0.0–0.2)

## 2020-01-10 LAB — URIC ACID: Uric Acid, Serum: 5.3 mg/dL (ref 3.7–8.6)

## 2020-01-10 LAB — PHOSPHORUS: Phosphorus: 3.8 mg/dL (ref 2.5–4.6)

## 2020-01-10 LAB — LACTATE DEHYDROGENASE: LDH: 148 U/L (ref 98–192)

## 2020-01-10 MED ORDER — SODIUM CHLORIDE 0.9% FLUSH
10.0000 mL | Freq: Once | INTRAVENOUS | Status: AC
  Administered 2020-01-10: 10 mL via INTRAVENOUS
  Filled 2020-01-10: qty 10

## 2020-01-10 MED ORDER — SODIUM CHLORIDE 0.9 % IV SOLN
Freq: Once | INTRAVENOUS | Status: AC
  Filled 2020-01-10: qty 250

## 2020-01-10 MED ORDER — SODIUM CHLORIDE 0.9 % IV SOLN
10.0000 [IU]/m2 | Freq: Once | INTRAVENOUS | Status: AC
  Administered 2020-01-10: 20 [IU] via INTRAVENOUS
  Filled 2020-01-10: qty 6.67

## 2020-01-10 MED ORDER — SODIUM CHLORIDE 0.9 % IV SOLN
150.0000 mg | Freq: Once | INTRAVENOUS | Status: AC
  Administered 2020-01-10: 150 mg via INTRAVENOUS
  Filled 2020-01-10: qty 150

## 2020-01-10 MED ORDER — SODIUM CHLORIDE 0.9% FLUSH
10.0000 mL | INTRAVENOUS | Status: DC | PRN
  Administered 2020-01-10: 10 mL
  Filled 2020-01-10: qty 10

## 2020-01-10 MED ORDER — DOXORUBICIN HCL CHEMO IV INJECTION 2 MG/ML
25.0000 mg/m2 | Freq: Once | INTRAVENOUS | Status: AC
  Administered 2020-01-10: 50 mg via INTRAVENOUS
  Filled 2020-01-10: qty 25

## 2020-01-10 MED ORDER — HEPARIN SOD (PORK) LOCK FLUSH 100 UNIT/ML IV SOLN
500.0000 [IU] | Freq: Once | INTRAVENOUS | Status: AC | PRN
  Administered 2020-01-10: 500 [IU]
  Filled 2020-01-10: qty 5

## 2020-01-10 MED ORDER — VINBLASTINE SULFATE CHEMO INJECTION 1 MG/ML
5.9500 mg/m2 | Freq: Once | INTRAVENOUS | Status: AC
  Administered 2020-01-10: 12 mg via INTRAVENOUS
  Filled 2020-01-10: qty 12

## 2020-01-10 MED ORDER — SODIUM CHLORIDE 0.9 % IV SOLN
10.0000 mg | Freq: Once | INTRAVENOUS | Status: AC
  Administered 2020-01-10: 10 mg via INTRAVENOUS
  Filled 2020-01-10: qty 10

## 2020-01-10 MED ORDER — SODIUM CHLORIDE 0.9 % IV SOLN
375.0000 mg/m2 | Freq: Once | INTRAVENOUS | Status: AC
  Administered 2020-01-10: 760 mg via INTRAVENOUS
  Filled 2020-01-10: qty 76

## 2020-01-10 MED ORDER — PALONOSETRON HCL INJECTION 0.25 MG/5ML
0.2500 mg | Freq: Once | INTRAVENOUS | Status: AC
  Administered 2020-01-10: 0.25 mg via INTRAVENOUS

## 2020-01-10 NOTE — Patient Instructions (Signed)
[facility] Discharge Instructions for Patients Receiving Chemotherapy  Today you received the following chemotherapy agents: Adriamycin, Bleomycin, Vincristine, Dacarbazine   To help prevent nausea and vomiting after your treatment, we encourage you to take your nausea medication as directed.   If you develop nausea and vomiting that is not controlled by your nausea medication, call the clinic.   BELOW ARE SYMPTOMS THAT SHOULD BE REPORTED IMMEDIATELY:  *FEVER GREATER THAN 100.5 F  *CHILLS WITH OR WITHOUT FEVER  NAUSEA AND VOMITING THAT IS NOT CONTROLLED WITH YOUR NAUSEA MEDICATION  *UNUSUAL SHORTNESS OF BREATH  *UNUSUAL BRUISING OR BLEEDING  TENDERNESS IN MOUTH AND THROAT WITH OR WITHOUT PRESENCE OF ULCERS  *URINARY PROBLEMS  *BOWEL PROBLEMS  UNUSUAL RASH Items with * indicate a potential emergency and should be followed up as soon as possible.  Feel free to call the clinic should you have any questions or concerns. The clinic phone number is [phone].  Please show the Organ at check-in to the Emergency Department and triage nurse.

## 2020-01-10 NOTE — Progress Notes (Signed)
Ok to tx with ANC =0.6.  Adding pegfilgrastim per Dr Lorenso Courier

## 2020-01-12 ENCOUNTER — Other Ambulatory Visit: Payer: Self-pay

## 2020-01-12 ENCOUNTER — Inpatient Hospital Stay: Payer: BC Managed Care – PPO

## 2020-01-12 VITALS — BP 106/60 | HR 62 | Temp 98.3°F | Resp 16

## 2020-01-12 DIAGNOSIS — D709 Neutropenia, unspecified: Secondary | ICD-10-CM | POA: Diagnosis not present

## 2020-01-12 DIAGNOSIS — Z5111 Encounter for antineoplastic chemotherapy: Secondary | ICD-10-CM | POA: Diagnosis not present

## 2020-01-12 DIAGNOSIS — Z23 Encounter for immunization: Secondary | ICD-10-CM | POA: Diagnosis not present

## 2020-01-12 DIAGNOSIS — C8112 Nodular sclerosis classical Hodgkin lymphoma, intrathoracic lymph nodes: Secondary | ICD-10-CM | POA: Diagnosis not present

## 2020-01-12 MED ORDER — PEGFILGRASTIM-CBQV 6 MG/0.6ML ~~LOC~~ SOSY
PREFILLED_SYRINGE | SUBCUTANEOUS | Status: AC
  Filled 2020-01-12: qty 0.6

## 2020-01-12 MED ORDER — PEGFILGRASTIM-CBQV 6 MG/0.6ML ~~LOC~~ SOSY
6.0000 mg | PREFILLED_SYRINGE | Freq: Once | SUBCUTANEOUS | Status: AC
  Administered 2020-01-12: 6 mg via SUBCUTANEOUS

## 2020-01-12 NOTE — Patient Instructions (Signed)

## 2020-01-15 ENCOUNTER — Telehealth: Payer: Self-pay

## 2020-01-15 NOTE — Telephone Encounter (Signed)
TC from Pt. Stating he was immunocompromised Pt. Stated that his temp has been fluctuating from 99-100.4. Asked Pt to take his temperature while we were on the phone and he stated it read 99.3 Informed Pt that this temp was ok,and  if his temperature goes to 100.5 and stays to give the triage a return call. Looking in Pt's chart Pt received GCSF on Friday 01/12/20 for [place] that was 0.6 Pt verbalized understanding, No further problems or concerns noted.

## 2020-01-16 ENCOUNTER — Encounter (HOSPITAL_COMMUNITY): Payer: Self-pay | Admitting: Emergency Medicine

## 2020-01-16 ENCOUNTER — Emergency Department (HOSPITAL_COMMUNITY)
Admission: EM | Admit: 2020-01-16 | Discharge: 2020-01-16 | Disposition: A | Discharge status: Home / Self Care | Payer: BC Managed Care – PPO | Attending: Emergency Medicine | Admitting: Emergency Medicine

## 2020-01-16 ENCOUNTER — Emergency Department (HOSPITAL_COMMUNITY): Payer: BC Managed Care – PPO

## 2020-01-16 ENCOUNTER — Other Ambulatory Visit: Payer: Self-pay

## 2020-01-16 DIAGNOSIS — Z20822 Contact with and (suspected) exposure to covid-19: Secondary | ICD-10-CM | POA: Diagnosis not present

## 2020-01-16 DIAGNOSIS — Z8571 Personal history of Hodgkin lymphoma: Secondary | ICD-10-CM | POA: Diagnosis not present

## 2020-01-16 DIAGNOSIS — R109 Unspecified abdominal pain: Secondary | ICD-10-CM | POA: Insufficient documentation

## 2020-01-16 DIAGNOSIS — R509 Fever, unspecified: Secondary | ICD-10-CM | POA: Diagnosis not present

## 2020-01-16 DIAGNOSIS — M545 Low back pain, unspecified: Secondary | ICD-10-CM

## 2020-01-16 DIAGNOSIS — R0982 Postnasal drip: Secondary | ICD-10-CM | POA: Diagnosis not present

## 2020-01-16 DIAGNOSIS — C801 Malignant (primary) neoplasm, unspecified: Secondary | ICD-10-CM | POA: Insufficient documentation

## 2020-01-16 DIAGNOSIS — T887XXA Unspecified adverse effect of drug or medicament, initial encounter: Secondary | ICD-10-CM

## 2020-01-16 DIAGNOSIS — R9431 Abnormal electrocardiogram [ECG] [EKG]: Secondary | ICD-10-CM | POA: Diagnosis not present

## 2020-01-16 DIAGNOSIS — Z87891 Personal history of nicotine dependence: Secondary | ICD-10-CM | POA: Insufficient documentation

## 2020-01-16 DIAGNOSIS — C8590 Non-Hodgkin lymphoma, unspecified, unspecified site: Secondary | ICD-10-CM | POA: Diagnosis not present

## 2020-01-16 LAB — COMPREHENSIVE METABOLIC PANEL
ALT: 26 U/L (ref 0–44)
AST: 20 U/L (ref 15–41)
Albumin: 4.4 g/dL (ref 3.5–5.0)
Alkaline Phosphatase: 68 U/L (ref 38–126)
Anion gap: 12 (ref 5–15)
BUN: 12 mg/dL (ref 6–20)
CO2: 26 mmol/L (ref 22–32)
Calcium: 9.7 mg/dL (ref 8.9–10.3)
Chloride: 100 mmol/L (ref 98–111)
Creatinine, Ser: 0.87 mg/dL (ref 0.61–1.24)
GFR calc Af Amer: >60 mL/min (ref >60)
GFR calc non Af Amer: >60 mL/min (ref >60)
Glucose, Bld: 90 mg/dL (ref 70–99)
Potassium: 3.9 mmol/L (ref 3.5–5.1)
Sodium: 138 mmol/L (ref 135–145)
Total Bilirubin: 1.4 mg/dL — ABNORMAL HIGH (ref 0.3–1.2)
Total Protein: 7.2 g/dL (ref 6.5–8.1)

## 2020-01-16 LAB — I-STAT CHEM 8, ED
BUN: 11 mg/dL (ref 6–20)
Calcium, Ion: 1.17 mmol/L (ref 1.15–1.40)
Chloride: 98 mmol/L (ref 98–111)
Creatinine, Ser: 0.8 mg/dL (ref 0.61–1.24)
Glucose, Bld: 89 mg/dL (ref 70–99)
HCT: 38 % — ABNORMAL LOW (ref 39.0–52.0)
Hemoglobin: 12.9 g/dL — ABNORMAL LOW (ref 13.0–17.0)
Potassium: 3.8 mmol/L (ref 3.5–5.1)
Sodium: 137 mmol/L (ref 135–145)
TCO2: 27 mmol/L (ref 22–32)

## 2020-01-16 LAB — CBC WITH DIFFERENTIAL/PLATELET
Abs Immature Granulocytes: 1.28 10*3/uL — ABNORMAL HIGH (ref 0.00–0.07)
Basophils Absolute: 0.1 10*3/uL (ref 0.0–0.1)
Basophils Relative: 1 %
Eosinophils Absolute: 0.1 10*3/uL (ref 0.0–0.5)
Eosinophils Relative: 1 %
HCT: 39.4 % (ref 39.0–52.0)
Hemoglobin: 14.1 g/dL (ref 13.0–17.0)
Immature Granulocytes: 11 %
Lymphocytes Relative: 16 %
Lymphs Abs: 1.8 10*3/uL (ref 0.7–4.0)
MCH: 29.8 pg (ref 26.0–34.0)
MCHC: 35.8 g/dL (ref 30.0–36.0)
MCV: 83.3 fL (ref 80.0–100.0)
Monocytes Absolute: 0.8 10*3/uL (ref 0.1–1.0)
Monocytes Relative: 7 %
Neutro Abs: 7.5 10*3/uL (ref 1.7–7.7)
Neutrophils Relative %: 64 %
Platelets: 228 10*3/uL (ref 150–400)
RBC: 4.73 MIL/uL (ref 4.22–5.81)
RDW: 12.6 % (ref 11.5–15.5)
WBC: 11.6 10*3/uL — ABNORMAL HIGH (ref 4.0–10.5)
nRBC: 0 % (ref 0.0–0.2)

## 2020-01-16 LAB — LACTIC ACID, PLASMA: Lactic Acid, Venous: 1.7 mmol/L (ref 0.5–1.9)

## 2020-01-16 LAB — PROTIME-INR
INR: 1 (ref 0.8–1.2)
Prothrombin Time: 13 seconds (ref 11.4–15.2)

## 2020-01-16 LAB — URINALYSIS, ROUTINE W REFLEX MICROSCOPIC
Bilirubin Urine: NEGATIVE
Glucose, UA: NEGATIVE mg/dL
Hgb urine dipstick: NEGATIVE
Ketones, ur: NEGATIVE mg/dL
Leukocytes,Ua: NEGATIVE
Nitrite: NEGATIVE
Protein, ur: NEGATIVE mg/dL
Specific Gravity, Urine: 1.011 (ref 1.005–1.030)
pH: 6 (ref 5.0–8.0)

## 2020-01-16 LAB — SARS CORONAVIRUS 2 BY RT PCR (HOSPITAL ORDER, PERFORMED IN ~~LOC~~ HOSPITAL LAB): SARS Coronavirus 2: NEGATIVE

## 2020-01-16 LAB — APTT: aPTT: 31 seconds (ref 24–36)

## 2020-01-16 MED ORDER — MORPHINE SULFATE (PF) 4 MG/ML IV SOLN
4.0000 mg | Freq: Once | INTRAVENOUS | Status: AC
  Administered 2020-01-16: 4 mg via INTRAVENOUS
  Filled 2020-01-16: qty 1

## 2020-01-16 MED ORDER — MORPHINE SULFATE (PF) 2 MG/ML IV SOLN
2.0000 mg | Freq: Once | INTRAVENOUS | Status: AC
  Administered 2020-01-16: 2 mg via INTRAVENOUS
  Filled 2020-01-16: qty 1

## 2020-01-16 MED ORDER — KETOROLAC TROMETHAMINE 15 MG/ML IJ SOLN
15.0000 mg | Freq: Once | INTRAMUSCULAR | Status: AC
  Administered 2020-01-16: 15 mg via INTRAVENOUS
  Filled 2020-01-16: qty 1

## 2020-01-16 MED ORDER — OXYCODONE HCL 5 MG PO TABS: 5.0000 mg | ORAL_TABLET | ORAL | 0 refills | Status: DC | PRN

## 2020-01-16 MED ORDER — ONDANSETRON HCL 4 MG/2ML IJ SOLN
4.0000 mg | Freq: Once | INTRAMUSCULAR | Status: AC
  Administered 2020-01-16: 4 mg via INTRAVENOUS
  Filled 2020-01-16: qty 2

## 2020-01-16 MED ORDER — ACETAMINOPHEN 500 MG PO TABS
1000.0000 mg | ORAL_TABLET | Freq: Once | ORAL | Status: AC
  Administered 2020-01-16: 1000 mg via ORAL
  Filled 2020-01-16: qty 2

## 2020-01-16 MED ORDER — IOHEXOL 300 MG/ML  SOLN
100.0000 mL | Freq: Once | INTRAMUSCULAR | Status: AC | PRN
  Administered 2020-01-16: 100 mL via INTRAVENOUS

## 2020-01-16 NOTE — ED Triage Notes (Signed)
Pt reports having bilateral flank pain that started last night. Pt also currently on chemo with last treatment 01/10/20

## 2020-01-16 NOTE — Discharge Instructions (Addendum)
You were evaluated in the Emergency Department and after careful evaluation, we did not find any emergent condition requiring admission or further testing in the hospital.  Your exam/testing today is overall reassuring.  Your symptoms seem to be due to side effects of the pegfilgrastim medication, which has successfully boosted your white blood cell count.  We recommend Tylenol or Motrin at home for discomfort.  You can use the oxycodone medication for more significant pain.  We advise close follow-up with your oncologist.  We have also tested you for the coronavirus given the reported fever.  Please follow-up on the result on MyChart.  If positive will be important to isolate yourself at home for 10 to 14 days per [facility] recommendations.  Please return to the Emergency Department if you experience any worsening of your condition.   Thank you for allowing [place] to be a part of your care.

## 2020-01-16 NOTE — ED Provider Notes (Signed)
  Provider Note MRN:  [id number]  Arrival date & time: 01/16/20    ED Course and Medical Decision Making  Assumed care from Dr. Tyrone Nine at shift change.  Hodgkin's lymphoma on chemo here with intermittent fevers, back pain awaiting CT imaging and ANC.  Would consider admission or call to oncology.  Work-up is reassuring no obvious infectious source.  Discussed case with patient's oncologist Dr. Salomon Fick, who explains that based on the timing patient symptoms are most likely related to side effect of the filgrastim medication, which can cause low-grade fevers and bone pain.  Patient is appropriate for discharge and close follow-up.  I will provide short course opioid pain medication for discomfort.  Procedures  Final Clinical Impressions(s) / ED Diagnoses     ICD-10-CM   1. Fever and chills  R50.9   2. Acute bilateral low back pain without sciatica  M54.5   3. Medication side effect  [email or web address]     ED Discharge Orders         Ordered    oxyCODONE (ROXICODONE) 5 MG immediate release tablet  Every 4 hours PRN        01/16/20 0904            Discharge Instructions     You were evaluated in the Emergency Department and after careful evaluation, we did not find any emergent condition requiring admission or further testing in the hospital.  Your exam/testing today is overall reassuring.  Your symptoms seem to be due to side effects of the pegfilgrastim medication, which has successfully boosted your white blood cell count.  We recommend Tylenol or Motrin at home for discomfort.  You can use the oxycodone medication for more significant pain.  We advise close follow-up with your oncologist.  We have also tested you for the coronavirus given the reported fever.  Please follow-up on the result on MyChart.  If positive will be important to isolate yourself at home for 10 to 14 days per [facility] recommendations.  Please return to the Emergency Department if you experience any worsening of your condition.    Thank you for allowing [place] to be a part of your care.     Barth Kirks. [place] Small, [place] [email or web address]    Maudie Flakes, MD 01/16/20 [id number]

## 2020-01-16 NOTE — ED Provider Notes (Signed)
[facility] DEPT Provider Note   CSN: [id number] Arrival date & time: 01/16/20  0865     History Chief Complaint  Patient presents with  . Flank Pain    Martin Spencer is a 29 y.o. male.  29 yo M with a chief complaints of low back pain. This woke him up from sleep. Patient states he has been having recurrent episodes where he feels febrile and has chills. Has severe low back pain that occurs bilaterally. Denies any specific urinary symptoms. Denies abdominal pain denies nausea or vomiting. Denies cough or congestion. Patient denies rash. He is undergoing chemotherapy for Hodgkin's lymphoma. Had an Bantam of 0.6. Called his physician yesterday because he had a temperature of 100.4. Told to come to the ED for 100.5. Had 100.5 temp this morning.  The history is provided by the patient.  Flank Pain This is a new problem. The current episode started 3 to 5 hours ago. The problem occurs constantly. The problem has not changed since onset.Pertinent negatives include no chest pain, no abdominal pain, no headaches and no shortness of breath. Nothing aggravates the symptoms. Nothing relieves the symptoms. He has tried nothing for the symptoms. The treatment provided no relief.       Past Medical History:  Diagnosis Date  . ADD (attention deficit disorder)   . Cancer ([place])   . Childhood asthma   . Pericarditis   . Shortness of breath    per patient, since recent diagnoses of pericarditis, sometimes get SOB on rest    Patient Active Problem List   Diagnosis Date Noted  . 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 Chest pain 11/17/2019  . Acute pericarditis 11/17/2019  . Mediastinal lymphadenopathy 11/17/2019  . Acute appendicitis 11/14/2017    Past Surgical History:  Procedure Laterality Date  . APPENDECTOMY    . FINE NEEDLE ASPIRATION  11/30/2019   Procedure: FINE NEEDLE ASPIRATION (FNA) LINEAR;  Surgeon:  Candee Furbish, MD;  Location: [facility] ENDOSCOPY;  Service: Pulmonary;;  . IR IMAGING GUIDED PORT INSERTION  12/26/2019  . LAPAROSCOPIC APPENDECTOMY N/A 11/14/2017   Procedure: APPENDECTOMY LAPAROSCOPIC;  Surgeon: Georganna Skeans, MD;  Location: [place];  Service: General;  Laterality: N/A;  . MEDIASTINOSCOPY N/A 12/08/2019   Procedure: MEDIASTINOSCOPY;  Surgeon: Melrose Nakayama, MD;  Location: [place];  Service: Thoracic;  Laterality: N/A;  . VIDEO BRONCHOSCOPY WITH ENDOBRONCHIAL ULTRASOUND N/A 11/30/2019   Procedure: VIDEO BRONCHOSCOPY WITH ENDOBRONCHIAL ULTRASOUND;  Surgeon: Candee Furbish, MD;  Location: [facility] ENDOSCOPY;  Service: Pulmonary;  Laterality: N/A;  . WISDOM TOOTH EXTRACTION         Family History  Problem Relation Age of Onset  . Kidney cancer Father   . Colon cancer Paternal Grandfather     Social History   Tobacco Use  . Smoking status: Former Smoker    Types: Cigarettes  . Smokeless tobacco: Never Used  . Tobacco comment: quit in high school  Vaping Use  . Vaping Use: Never used  Substance Use Topics  . Alcohol use: Yes    Comment: per patient "rarely"  . Drug use: Not Currently    Types: Marijuana    Comment: CBD last use 2 weeks ago ( 11/29/19)    Home Medications Prior to Admission medications   Medication Sig Start Date End Date Taking? Authorizing Provider  lidocaine-prilocaine (EMLA) cream Apply 1 application topically as needed. 12/21/19   Orson Slick, MD  omeprazole (PRILOSEC) 40 MG capsule Take 1 capsule (40 mg total) by mouth daily. 11/19/19 12/19/19  Darliss Cheney, MD  ondansetron (ZOFRAN) 8 MG tablet Take 1 tablet (8 mg total) by mouth every 8 (eight) hours as needed for nausea or vomiting. 12/21/19   Orson Slick, MD  oxyCODONE (OXY IR/ROXICODONE) 5 MG immediate release tablet Take 1-2 tablets (5-10 mg total) by mouth every 6 (six) hours as needed for moderate pain or severe pain. Patient not taking: Reported on 12/18/2019 12/08/19   Melrose Nakayama, MD  prochlorperazine (COMPAZINE) 10 MG tablet Take 1 tablet (10 mg total) by mouth every 6 (six) hours as needed for nausea or vomiting. 12/21/19   Orson Slick, MD    Allergies    Other  Review of Systems   Review of Systems  Constitutional: Negative for chills and fever.  HENT: Negative for congestion and facial swelling.   Eyes: Negative for discharge and visual disturbance.  Respiratory: Negative for shortness of breath.   Cardiovascular: Negative for chest pain and palpitations.  Gastrointestinal: Negative for abdominal pain, diarrhea and vomiting.  Genitourinary: Positive for flank pain.  Musculoskeletal: Positive for back pain. Negative for arthralgias and myalgias.  Skin: Negative for color change and rash.  Neurological: Negative for tremors, syncope and headaches.  Psychiatric/Behavioral: Negative for confusion and dysphoric mood.    Physical Exam Updated Vital Signs BP 112/77 (BP Location: Left Arm)   Pulse 91   Temp 98.7 F (37.1 C) (Oral)   Resp 18   Ht 5\' 11"  (1.803 m)   Wt 81.6 kg   SpO2 97%   BMI 25.09 kg/m   Physical Exam Vitals and nursing note reviewed.  Constitutional:      Appearance: He is well-developed.  HENT:     Head: Normocephalic and atraumatic.     Ears:     Comments: Swollen turbinates, posterior nasal drip, no noted sinus ttp, tm normal bilaterally.   Eyes:     Pupils: Pupils are equal, round, and reactive to light.  Neck:     Vascular: No JVD.  Cardiovascular:     Rate and Rhythm: Normal rate and regular rhythm.     Heart sounds: No murmur heard.  No friction rub. No gallop.   Pulmonary:     Effort: No respiratory distress.     Breath sounds: No wheezing.  Abdominal:     General: There is no distension.     Tenderness: There is no abdominal tenderness. There is no right CVA tenderness, left CVA tenderness, guarding or rebound.     Comments: Benign abdominal exam. No CVA tenderness.  Musculoskeletal:         General: No tenderness. Normal range of motion.     Cervical back: Normal range of motion and neck supple.     Comments: No obvious midline spinal tenderness.  Skin:    Coloration: Skin is not pale.     Findings: No rash.  Neurological:     Mental Status: He is alert and oriented to person, place, and time.  Psychiatric:        Behavior: Behavior normal.     ED Results / Procedures / Treatments   Labs (all labs ordered are listed, but only abnormal results are displayed) Labs Reviewed  CBC WITH DIFFERENTIAL/PLATELET - Abnormal; Notable for the following components:      Result Value   WBC 11.6 (*)    All other components within normal limits  I-STAT CHEM 8, ED - Abnormal; Notable for the following components:   Hemoglobin 12.9 (*)    HCT 38.0 (*)    All other components within normal limits  URINE CULTURE  CULTURE, BLOOD (ROUTINE X 2)  CULTURE, BLOOD (ROUTINE X 2)  SARS CORONAVIRUS 2 BY RT PCR (HOSPITAL ORDER, [place])  LACTIC ACID, PLASMA  PROTIME-INR  APTT  URINALYSIS, ROUTINE W REFLEX MICROSCOPIC  LACTIC ACID, PLASMA  COMPREHENSIVE METABOLIC PANEL    EKG EKG Interpretation  Date/Time:  Tuesday January 16 2020 06:24:37 EDT Ventricular Rate:  82 PR Interval:    QRS Duration: 103 QT Interval:  381 QTC Calculation: 445 R Axis:   88 Text Interpretation: Sinus rhythm Borderline short PR interval No significant change since last tracing Confirmed by Deno Etienne [phone]) on 01/16/2020 6:57:23 AM   Radiology DG Chest Port 1 View  Result Date: 01/16/2020 CLINICAL DATA:  Flank pain.  Fever.  History of lymphoma. EXAM: PORTABLE CHEST 1 VIEW COMPARISON:  Chest x-ray 12/06/2019.  PET-CT 11/24/2019. FINDINGS: PowerPort catheter noted with tip over cavoatrial junction. Mediastinal prominence consistent with known adenopathy noted, less obvious on today's exam. No focal infiltrate. No pleural effusion or pneumothorax. Heart size normal. No acute  bony abnormality. IMPRESSION: 1. PowerPort catheter with tip over cavoatrial junction. No pneumothorax. 2. Mediastinal prominence consistent with known adenopathy again noted, less obvious on today's exam. No acute cardiopulmonary disease identified. Electronically Signed   By: Marcello Moores  Register   On: 01/16/2020 06:20    Procedures Procedures (including critical care time)  Medications Ordered in ED Medications  iohexol (OMNIPAQUE) 300 MG/ML solution 100 mL (has no administration in time range)  morphine 4 MG/ML injection 4 mg (4 mg Intravenous Given 01/16/20 0618)  ondansetron (ZOFRAN) injection 4 mg (4 mg Intravenous Given 01/16/20 0621)  acetaminophen (TYLENOL) tablet 1,000 mg (1,000 mg Oral Given 01/16/20 8676)    ED Course  I have reviewed the triage vital signs and the nursing notes.  Pertinent labs & imaging results that were available during my care of the patient were reviewed by me and considered in my medical decision making (see chart for details).    MDM Rules/Calculators/A&P                          29 yo M with a chief complaints of low back pain. Patient has been having fevers chills and rigors. Going on since yesterday. Could be the cause of his back pain. As the patient is neutropenic and undergoing chemotherapy will obtain a CT scan of the abdomen pelvis. Chest x-ray UA. Bolus of IV fluids pain and nausea medicine. Reassess.   Signed out to Dr. Sedonia Small, please see his note for further details of care in the ED.   The patients results and plan were reviewed and discussed.   Any x-rays performed were independently reviewed by myself.   Differential diagnosis were considered with the presenting HPI.  Medications  iohexol (OMNIPAQUE) 300 MG/ML solution 100 mL (has no administration in time range)  morphine 4 MG/ML injection 4 mg (4 mg Intravenous Given 01/16/20 0618)  ondansetron (ZOFRAN) injection 4 mg (4 mg Intravenous Given 01/16/20 0621)  acetaminophen (TYLENOL) tablet  1,000 mg (1,000 mg Oral Given 01/16/20 0621)    Vitals:   01/16/20 0554  BP: 112/77  Pulse: 91  Resp: 18  Temp: 98.7 F (37.1 C)  TempSrc: Oral  SpO2: 97%  Weight: 81.6  kg  Height: 5\' 11"  (1.803 m)    Final diagnoses:  Fever and chills  Acute bilateral low back pain without sciatica       Final Clinical Impression(s) / ED Diagnoses Final diagnoses:  Fever and chills  Acute bilateral low back pain without sciatica    Rx / DC Orders ED Discharge Orders    None       Deno Etienne, DO 01/16/20 2575

## 2020-01-17 ENCOUNTER — Telehealth: Payer: Self-pay | Admitting: *Deleted

## 2020-01-17 LAB — URINE CULTURE: Culture: NO GROWTH

## 2020-01-17 NOTE — Telephone Encounter (Signed)
TCT patient to follow up with him after his visit to the ED.  Spoke to patient . He states he is feeling better. Denies fever or chills. He states his pain is better. Reassured him that his symptoms are likely from GCSF injection he received after his chemo. Reminded him to take his claritin and ibuprofen. He voiced understanding. He is aware of his upcoming appts next week.

## 2020-01-19 ENCOUNTER — Encounter: Payer: Self-pay | Admitting: Hematology and Oncology

## 2020-01-21 LAB — CULTURE, BLOOD (ROUTINE X 2)
Culture: NO GROWTH
Culture: NO GROWTH
Special Requests: ADEQUATE

## 2020-01-23 ENCOUNTER — Other Ambulatory Visit: Payer: Self-pay | Admitting: Hematology and Oncology

## 2020-01-23 DIAGNOSIS — C8112 Nodular sclerosis classical Hodgkin lymphoma, intrathoracic lymph nodes: Secondary | ICD-10-CM

## 2020-01-23 NOTE — Progress Notes (Signed)
[facility] [phone]   [phone]  PROGRESS NOTE  Patient Care Team: Patient, No Pcp Per as PCP - General (General Practice) Martin Munroe, MD as PCP - Cardiology (Cardiology)  Hematological/Oncological History # Classical Hodgkin Lymphoma, Stage I.  Favorable Disease 1) 11/17/2019: presented to the emergency department with chest pain. CXR performed showed right paratracheal density. CT recommended. CT angiogram showed diffuse enlargement of the thymus gland with associated mild mediastinal adenopathy including right paratracheal enlarged lymph node corresponding to radiographic abnormality 2) 11/19/2019: CT abdomen performed showing subtle mass versus steatosis within the caudate lobe of liver; due to history of thoracic adenopathy and thymic infiltration, follow-up non emergent MR imaging of the liver with and without contrast recommended to exclude hepatic mass 3) 11/24/2019: PET CT scan performed showing hypermetabolic mediastinal adenopathy, consistent with lymphoma. ([place] [facility]). No extrathoracic disease 4) 11/30/2019: EBUS performed, failed to provide adequate tissue for diagnosis 5) 12/08/2019: mediastinoscopy performed, biopsy confirmed Hodgkin's lymphoma.  6) 12/28/2019 Cycle 1 Day 1 of ABVD Chemotherapy  7) 01/10/2020: Cycle 1 Day 15 of ABVD Chemotherapy  8) 01/23/2020: Cycle 2 Day 1 of ABVD Chemotherapy   Interval History:  Martin Spencer 29 y.o. male with medical history significant for classical Hodgkin Lymphoma who presents for a follow up visit. The patient was last seen on 01/10/2020 to start Cycle 1 Day 15 of treatment. He presents today prior to Cycle 2 Day 1 of chemotherapy. In the interim he had an ED visit due to low grade fever and bone pain, likely from his GCSF shot.   On exam today [name] is accompanied by his mother.  He notes that he had a lot of pain in his hips bilaterally that lasted for approximately 8 days after  receiving his filgrastim shot.  He notes that day 4 was the most severe and that the severe pain lasted for about 12 hours which is when he presented to the emergency department.  He notes that he read a T-max of 100.4 F.  On further discussion he notes that the chemotherapy has "killed his drive to do anything".  He notes that he feels that he has a mental fog and has not been working as efficiently as he had been previously.  He notes that he has had some issues with slight nausea but that he has not had any issues with vomiting because he has been on top of his nausea medications.  Ports taking these daily in order to help prevent his nausea.  He notes otherwise he has some constipation the days following his treatment, but otherwise has been doing quite well.  He is concerned that he feels more depressed than usual and his energy is low.  He currently denies any fevers, chills, sweats, shortness of breath, chest pain, cough, or other symptoms at this time.  A full 10 point ROS is listed below.  MEDICAL HISTORY:  Past Medical History:  Diagnosis Date  . ADD (attention deficit disorder)   . Cancer ([place])   . Childhood asthma   . Pericarditis   . Shortness of breath    per patient, since recent diagnoses of pericarditis, sometimes get SOB on rest   SURGICAL HISTORY: Past Surgical History:  Procedure Laterality Date  . APPENDECTOMY    . FINE NEEDLE ASPIRATION  11/30/2019   Procedure: FINE NEEDLE ASPIRATION (FNA) LINEAR;  Surgeon: Candee Furbish, MD;  Location: [facility] ENDOSCOPY;  Service: Pulmonary;;  . IR IMAGING GUIDED PORT  INSERTION  12/26/2019  . LAPAROSCOPIC APPENDECTOMY N/A 11/14/2017   Procedure: APPENDECTOMY LAPAROSCOPIC;  Surgeon: Georganna Skeans, MD;  Location: [place];  Service: General;  Laterality: N/A;  . MEDIASTINOSCOPY N/A 12/08/2019   Procedure: MEDIASTINOSCOPY;  Surgeon: Melrose Nakayama, MD;  Location: [place];  Service: Thoracic;  Laterality: N/A;  . VIDEO BRONCHOSCOPY WITH  ENDOBRONCHIAL ULTRASOUND N/A 11/30/2019   Procedure: VIDEO BRONCHOSCOPY WITH ENDOBRONCHIAL ULTRASOUND;  Surgeon: Candee Furbish, MD;  Location[facility] ENDOSCOPY;  Service: Pulmonary;  Laterality: N/A;  . WISDOM TOOTH EXTRACTION      SOCIAL HISTORY: Social History   Socioeconomic History  . Marital status: Single    Spouse name: Not on file  . Number of children: Not on file  . Years of education: Not on file  . Highest education level: Not on file  Occupational History  . Not on file  Tobacco Use  . Smoking status: Former Smoker    Types: Cigarettes  . Smokeless tobacco: Never Used  . Tobacco comment: quit in high school  Vaping Use  . Vaping Use: Never used  Substance and Sexual Activity  . Alcohol use: Yes    Comment: per patient "rarely"  . Drug use: Not Currently    Types: Marijuana    Comment: CBD last use 2 weeks ago ( 11/29/19)  . Sexual activity: Not on file  Other Topics Concern  . Not on file  Social History Narrative  . Not on file   Social Determinants of Health   Financial Resource Strain:   . Difficulty of Paying Living Expenses: Not on file  Food Insecurity:   . Worried About Charity fundraiser in the Last Year: Not on file  . Ran Out of Food in the Last Year: Not on file  Transportation Needs:   . Lack of Transportation (Medical): Not on file  . Lack of Transportation (Non-Medical): Not on file  Physical Activity:   . Days of Exercise per Week: Not on file  . Minutes of Exercise per Session: Not on file  Stress:   . Feeling of Stress : Not on file  Social Connections:   . Frequency of Communication with Friends and Family: Not on file  . Frequency of Social Gatherings with Friends and Family: Not on file  . Attends Religious Services: Not on file  . Active Member of Clubs or Organizations: Not on file  . Attends Archivist Meetings: Not on file  . Marital Status: Not on file  Intimate Partner Violence:   . Fear of Current or Ex-Partner:  Not on file  . Emotionally Abused: Not on file  . Physically Abused: Not on file  . Sexually Abused: Not on file    FAMILY HISTORY: Family History  Problem Relation Age of Onset  . Kidney cancer Father   . Colon cancer Paternal Grandfather     ALLERGIES:  is allergic to other.  MEDICATIONS:  Current Outpatient Medications  Medication Sig Dispense Refill  . acetaminophen (TYLENOL) 500 MG tablet Take 500 mg by mouth every 6 (six) hours as needed for mild pain or headache.    . ibuprofen (ADVIL) 200 MG tablet Take 600 mg by mouth every 6 (six) hours as needed for headache or mild pain.    Marland Kitchen lidocaine-prilocaine (EMLA) cream Apply 1 application topically as needed. (Patient taking differently: Apply 1 application topically as needed (port). ) 30 g 1  . omeprazole (PRILOSEC) 40 MG capsule Take 1 capsule (40 mg  total) by mouth daily. (Patient not taking: Reported on 01/16/2020) 30 capsule 0  . ondansetron (ZOFRAN) 8 MG tablet Take 1 tablet (8 mg total) by mouth every 8 (eight) hours as needed for nausea or vomiting. 20 tablet 1  . prochlorperazine (COMPAZINE) 10 MG tablet Take 1 tablet (10 mg total) by mouth every 6 (six) hours as needed for nausea or vomiting. 30 tablet 1   No current facility-administered medications for this visit.   Facility-Administered Medications Ordered in Other Visits  Medication Dose Route Frequency Provider Last Rate Last Admin  . bleomycin (BLEOCIN) 20 Units in sodium chloride 0.9 % 50 mL chemo infusion  10 Units/m2 (Treatment Plan Recorded) Intravenous Once Ledell Peoples IV, MD      . dacarbazine (DTIC) 760 mg in sodium chloride 0.9 % 250 mL chemo infusion  375 mg/m2 (Treatment Plan Recorded) Intravenous Once Orson Slick, MD      . dexamethasone (DECADRON) 10 mg in sodium chloride 0.9 % 50 mL IVPB  10 mg Intravenous Once Ledell Peoples IV, MD 204 mL/hr at 01/24/20 1206 10 mg at 01/24/20 1206  . DOXOrubicin (ADRIAMYCIN) chemo injection 50 mg  25 mg/m2  (Treatment Plan Recorded) Intravenous Once Ledell Peoples IV, MD      . fosaprepitant (EMEND) 150 mg in sodium chloride 0.9 % 145 mL IVPB  150 mg Intravenous Once Narda Rutherford T IV, MD      . heparin lock flush 100 unit/mL  500 Units Intracatheter Once PRN Orson Slick, MD      . influenza vac split quadrivalent PF (FLUARIX) injection 0.5 mL  0.5 mL Intramuscular Once Narda Rutherford T IV, MD      . sodium chloride flush (NS) 0.9 % injection 10 mL  10 mL Intracatheter PRN Ledell Peoples IV, MD      . vinBLAStine (VELBAN) 12 mg in sodium chloride 0.9 % 50 mL chemo infusion  5.95 mg/m2 (Treatment Plan Recorded) Intravenous Once Orson Slick, MD        REVIEW OF SYSTEMS:   Constitutional: ( - ) fevers, ( - )  chills , ( - ) night sweats Eyes: ( - ) blurriness of vision, ( - ) double vision, ( - ) watery eyes Ears, nose, mouth, throat, and face: ( - ) mucositis, ( - ) sore throat Respiratory: ( - ) cough, ( - ) dyspnea, ( - ) wheezes Cardiovascular: ( - ) palpitation, ( - ) chest discomfort, ( - ) lower extremity swelling Gastrointestinal:  ( - ) nausea, ( - ) heartburn, ( - ) change in bowel habits Skin: ( - ) abnormal skin rashes Lymphatics: ( - ) new lymphadenopathy, ( - ) easy bruising Neurological: ( - ) numbness, ( - ) tingling, ( - ) new weaknesses Behavioral/Psych: ( - ) mood change, ( - ) new changes  All other systems were reviewed with the patient and are negative.  PHYSICAL EXAMINATION: ECOG PERFORMANCE STATUS: 0 - Asymptomatic  Vitals:   01/24/20 1013  BP: 133/78  Pulse: 79  Resp: 18  Temp: (!) 96.6 F (35.9 C)  SpO2: 100%   Filed Weights   01/24/20 1013  Weight: 184 lb 11.2 oz (83.8 kg)    GENERAL: well appearing fit young Caucasian male. alert, no distress and comfortable SKIN: skin color, texture, turgor are normal, no rashes or significant lesions. Small surgical incision at base of neck, well healed.  EYES: conjunctiva are pink  and non-injected, sclera  clear LUNGS: clear to auscultation and percussion with normal breathing effort HEART: regular rate & rhythm and no murmurs and no lower extremity edema Musculoskeletal: no cyanosis of digits and no clubbing  PSYCH: alert & oriented x 3, fluent speech NEURO: no focal motor/sensory deficits  LABORATORY DATA:  I have reviewed the data as listed CBC Latest Ref Rng & Units 01/24/2020 01/16/2020 01/16/2020  WBC 4.0 - 10.5 K/uL 16.5(H) - 11.6(H)  Hemoglobin 13.0 - 17.0 g/dL 13.2 12.9(L) 14.1  Hematocrit 39 - 52 % 38.1(L) 38.0(L) 39.4  Platelets 150 - 400 K/uL 85(L) - 228    CMP Latest Ref Rng & Units 01/24/2020 01/16/2020 01/16/2020  Glucose 70 - 99 mg/dL 97 89 90  BUN 6 - 20 mg/dL 12 11 12   Creatinine 0.61 - 1.24 mg/dL 0.85 0.80 0.87  Sodium 135 - 145 mmol/L 138 137 138  Potassium 3.5 - 5.1 mmol/L 4.0 3.8 3.9  Chloride 98 - 111 mmol/L 105 98 100  CO2 22 - 32 mmol/L 28 - 26  Calcium 8.9 - 10.3 mg/dL 9.4 - 9.7  Total Protein 6.5 - 8.1 g/dL 7.0 - 7.2  Total Bilirubin 0.3 - 1.2 mg/dL 0.6 - 1.4(H)  Alkaline Phos 38 - 126 U/L 89 - 68  AST 15 - 41 U/L 32 - 20  ALT 0 - 44 U/L 60(H) - 26    RADIOGRAPHIC STUDIES: I have personally review the images and agree with the findings below: marked FDG avid mediastinal lymph nodes. Mild uptake under left arm (prior COVID vaccine). No disease noted elsewhere  CT ABDOMEN PELVIS W CONTRAST  Result Date: 01/16/2020 CLINICAL DATA:  Bilateral flank pain for 1 day EXAM: CT ABDOMEN AND PELVIS WITH CONTRAST TECHNIQUE: Multidetector CT imaging of the abdomen and pelvis was performed using the standard protocol following bolus administration of intravenous contrast. CONTRAST:  152mL OMNIPAQUE IOHEXOL 300 MG/ML  SOLN COMPARISON:  11/19/2019 FINDINGS: Lower chest:  No contributory findings. Hepatobiliary: No focal liver abnormality.No evidence of biliary obstruction or stone. Pancreas: Unremarkable. Spleen: Unremarkable. Adrenals/Urinary Tract: Negative adrenals. No  hydronephrosis or stone. Unremarkable bladder. Stomach/Bowel: No obstruction. Appendectomy. No bowel obstruction or visible inflammation. Vascular/Lymphatic: No acute vascular abnormality. No mass or adenopathy. Reproductive:No pathologic findings. Other: Scant pelvic fluid without visible cause, nonspecific in isolation. Musculoskeletal: No acute abnormalities. IMPRESSION: 1. No specific explanation for symptoms. There is trace pelvic fluid without visible inflammation or adenopathy. 2. Appendectomy. Electronically Signed   By: Monte Fantasia M.D.   On: 01/16/2020 07:33   DG Chest Port 1 View  Result Date: 01/16/2020 CLINICAL DATA:  Flank pain.  Fever.  History of lymphoma. EXAM: PORTABLE CHEST 1 VIEW COMPARISON:  Chest x-ray 12/06/2019.  PET-CT 11/24/2019. FINDINGS: PowerPort catheter noted with tip over cavoatrial junction. Mediastinal prominence consistent with known adenopathy noted, less obvious on today's exam. No focal infiltrate. No pleural effusion or pneumothorax. Heart size normal. No acute bony abnormality. IMPRESSION: 1. PowerPort catheter with tip over cavoatrial junction. No pneumothorax. 2. Mediastinal prominence consistent with known adenopathy again noted, less obvious on today's exam. No acute cardiopulmonary disease identified. Electronically Signed   By: Marcello Moores  Register   On: 01/16/2020 06:20   IR IMAGING GUIDED PORT INSERTION  Result Date: 12/26/2019 CLINICAL DATA:  HODGKIN'S LYMPHOMA EXAM: RIGHT INTERNAL JUGULAR SINGLE LUMEN POWER PORT CATHETER INSERTION Date:  12/26/2019 12/26/2019 3:32 pm Radiologist:  Jerilynn Mages. Daryll Brod, MD Guidance:  Ultrasound and fluoroscopic MEDICATIONS: Ancef 2 g; The antibiotic was administered within  an appropriate time interval prior to skin puncture. ANESTHESIA/SEDATION: Versed 6.0 mg IV; Fentanyl 200 mcg IV; Moderate Sedation Time:  30 minutes The patient was continuously monitored during the procedure by the interventional radiology nurse under my direct  supervision. FLUOROSCOPY TIME:  0 minutes, 30 seconds (3.0 mGy) COMPLICATIONS: None immediate. CONTRAST:  None. PROCEDURE: Informed consent was obtained from the patient following explanation of the procedure, risks, benefits and alternatives. The patient understands, agrees and consents for the procedure. All questions were addressed. A time out was performed. Maximal barrier sterile technique utilized including caps, mask, sterile gowns, sterile gloves, large sterile drape, hand hygiene, and 2% chlorhexidine scrub. Under sterile conditions and local anesthesia, right internal jugular micropuncture venous access was performed. Access was performed with ultrasound. Images were obtained for documentation of the patent right internal jugular vein. A guide wire was inserted followed by a transitional dilator. This allowed insertion of a guide wire and catheter into the IVC. Measurements were obtained from the SVC / RA junction back to the right IJ venotomy site. In the right infraclavicular chest, a subcutaneous pocket was created over the second anterior rib. This was done under sterile conditions and local anesthesia. 1% lidocaine with epinephrine was utilized for this. A 2.5 cm incision was made in the skin. Blunt dissection was performed to create a subcutaneous pocket over the right pectoralis major muscle. The pocket was flushed with saline vigorously. There was adequate hemostasis. The port catheter was assembled and checked for leakage. The port catheter was secured in the pocket with two retention sutures. The tubing was tunneled subcutaneously to the right venotomy site and inserted into the SVC/RA junction through a valved peel-away sheath. Position was confirmed with fluoroscopy. Images were obtained for documentation. The patient tolerated the procedure well. No immediate complications. Incisions were closed in a two layer fashion with 4 - 0 Vicryl suture. Dermabond was applied to the skin. The port  catheter was accessed, blood was aspirated followed by saline and heparin flushes. Needle was removed. A dry sterile dressing was applied. IMPRESSION: Ultrasound and fluoroscopically guided right internal jugular single lumen power port catheter insertion. Tip in the SVC/RA junction. Catheter ready for use. Electronically Signed   By: Jerilynn Mages.  Shick M.D.   On: 12/26/2019 15:45    ASSESSMENT & PLAN Martin Spencer 29 y.o. male with medical history significant for classical Hodgkin Lymphoma stage I who presents for a follow up visit. Today is Cycle 2 Day 1 of chemotherapy.   On exam today Mr. Kutz is tolerating chemotherapy well overall.  He is mildly thrombocytopenic with a platelet count of 85 and his white blood cell count is robustly elevated.  He is not having any cough, fevers, chills, sweats.  He is clear for treatment with chemotherapy today with the understanding that his platelet count is lower than anticipated.  After discussion with pharmacy and discussion with the patient we agreed to proceed with ABVD chemotherapy and provide the patient with G-CSF support.  I discussed with the patient the risks of being neutropenic and the precautions he should take in the event he developed fevers, chills, sweats, or other infectious symptoms.  I also stressed to him the importance of hand washing, wearing a mask, and avoiding unnecessary contact with others.   Previously we discussed the patient has a stage I classical Hodgkin's lymphoma, favorable disease.  He is young and otherwise fit and therefore would be able to tolerate full ABVD chemotherapy.  We discussed expected side  effects including nausea, vomiting, constipation, nerve damage, fertility loss, hair loss, and possible secondary malignancy.  The patient and his parents voiced their understanding of the reasoning behind obtaining PFTs, chemotherapy education, port placement, and fertility preservation at [facility].  They have  the opportunity to ask all questions and concerns regarding treatment moving forward.  The regimen of choice for this stage I classical Hodgkin's lymphoma, favorable disease would consist of ABVD chemotherapy.  ABVD is comprised of doxorubicin 25 mg/m IV, bleomycin 10 units/m IV, vinblastine 6 g/m IV, and dacarbazine 375 mg/m IV all to be administered on days 1 and 15 of a 28-day cycle.  After 2 cycles the patient is to undergo a PET CT scan at which time treatment moving forward can be dictated by response to therapy.  # Classical Hodgkin Lymphoma, Stage I.  Favorable Disease.  --Cycle 2 Day 1 ABVD chemotherapy today --patient has completed PFTs, Port, and fertility preservation. TTE performed on 11/18/2019 (EF 65%)  --discussed the risks and benefits of treatment. Additionally noted that we will re-evaluate with a PET CT scan after 4 doses (2 cycles) of chemotherapy.  --Will have patient return to clinic q 2 weeks with continued chemotherapy with interval lab visit in 1 weeks time.   #Neutropenia, resolved -[place] 9300 today, still have leukocytosis from last dosing (WBC 16.5 today)  --will proceed with chemotherapy today after we discussed the risks and benefits. He was agreeable to proceeding with treatment --continue GCSF support to the current cycle and subsequent cycles  #Thrombocytopenia --Plt 85 today, previously normal --continue to monitor, recommend lab check next week to continue to monitor    # Symptom Management -- prescribed zofran 8mg  PO q8H PRN and compazine 10mg  q6H PRN.  --EMLA cream for port -- assure daily BM while on therapy with vincristine. Assure patient has senna docusate and miralax on hand during treatment.  --encourage claratin/ibuprofen with GCSF shot. Will prescribe short course of oxycodone for breakthrough pain.  --strict return precautions for shortness of breath, cough, fevers.   No orders of the defined types were placed in this encounter.  All  questions were answered. The patient knows to call the clinic with any problems, questions or concerns.  A total of more than 30 minutes were spent on this encounter and over half of that time was spent on counseling and coordination of care as outlined above.   Ledell Peoples, MD Department of Hematology/Oncology [place] [facility] Phone: [phone] Pager: [phone] Email: [email or web address]  01/24/2020 12:07 PM   Literature Support:  Sharion Dove, et al. The use of filgrastim in patients with Hodgkin lymphoma receiving ABVD. Int J Hematol Oncol Stem Cell Res. 2017;11(4):286-292.  --This study does not find evidence that the combination of bleomycin and G-CSF increases the risk for bleomycin- induced pulmonary toxicity. We recommend G-CSF use in HL patients receiving bleomycin when needed to maintain dose intensity.

## 2020-01-24 ENCOUNTER — Inpatient Hospital Stay: Payer: BC Managed Care – PPO

## 2020-01-24 ENCOUNTER — Other Ambulatory Visit: Payer: Self-pay

## 2020-01-24 ENCOUNTER — Other Ambulatory Visit: Payer: BC Managed Care – PPO

## 2020-01-24 ENCOUNTER — Encounter: Payer: Self-pay | Admitting: Hematology and Oncology

## 2020-01-24 ENCOUNTER — Inpatient Hospital Stay (HOSPITAL_BASED_OUTPATIENT_CLINIC_OR_DEPARTMENT_OTHER): Payer: BC Managed Care – PPO | Admitting: Hematology and Oncology

## 2020-01-24 VITALS — BP 133/78 | HR 79 | Temp 96.6°F | Resp 18 | Ht 71.0 in | Wt 184.7 lb

## 2020-01-24 DIAGNOSIS — D709 Neutropenia, unspecified: Secondary | ICD-10-CM | POA: Diagnosis not present

## 2020-01-24 DIAGNOSIS — Z23 Encounter for immunization: Secondary | ICD-10-CM | POA: Diagnosis not present

## 2020-01-24 DIAGNOSIS — Z5111 Encounter for antineoplastic chemotherapy: Secondary | ICD-10-CM | POA: Diagnosis not present

## 2020-01-24 DIAGNOSIS — Z95828 Presence of other vascular implants and grafts: Secondary | ICD-10-CM

## 2020-01-24 DIAGNOSIS — R590 Localized enlarged lymph nodes: Secondary | ICD-10-CM | POA: Diagnosis not present

## 2020-01-24 DIAGNOSIS — C8112 Nodular sclerosis classical Hodgkin lymphoma, intrathoracic lymph nodes: Secondary | ICD-10-CM

## 2020-01-24 LAB — CMP (CANCER CENTER ONLY)
ALT: 60 U/L — ABNORMAL HIGH (ref 0–44)
AST: 32 U/L (ref 15–41)
Albumin: 4.1 g/dL (ref 3.5–5.0)
Alkaline Phosphatase: 89 U/L (ref 38–126)
Anion gap: 5 (ref 5–15)
BUN: 12 mg/dL (ref 6–20)
CO2: 28 mmol/L (ref 22–32)
Calcium: 9.4 mg/dL (ref 8.9–10.3)
Chloride: 105 mmol/L (ref 98–111)
Creatinine: 0.85 mg/dL (ref 0.61–1.24)
GFR, Est AFR Am: >60 mL/min (ref >60)
GFR, Estimated: >60 mL/min (ref >60)
Glucose, Bld: 97 mg/dL (ref 70–99)
Potassium: 4 mmol/L (ref 3.5–5.1)
Sodium: 138 mmol/L (ref 135–145)
Total Bilirubin: 0.6 mg/dL (ref 0.3–1.2)
Total Protein: 7 g/dL (ref 6.5–8.1)

## 2020-01-24 LAB — CBC WITH DIFFERENTIAL (CANCER CENTER ONLY)
Abs Immature Granulocytes: 3.16 10*3/uL — ABNORMAL HIGH (ref 0.00–0.07)
Basophils Absolute: 0.1 10*3/uL (ref 0.0–0.1)
Basophils Relative: 0 %
Eosinophils Absolute: 0 10*3/uL (ref 0.0–0.5)
Eosinophils Relative: 0 %
HCT: 38.1 % — ABNORMAL LOW (ref 39.0–52.0)
Hemoglobin: 13.2 g/dL (ref 13.0–17.0)
Immature Granulocytes: 19 %
Lymphocytes Relative: 18 %
Lymphs Abs: 2.9 10*3/uL (ref 0.7–4.0)
MCH: 28.5 pg (ref 26.0–34.0)
MCHC: 34.6 g/dL (ref 30.0–36.0)
MCV: 82.3 fL (ref 80.0–100.0)
Monocytes Absolute: 1.1 10*3/uL — ABNORMAL HIGH (ref 0.1–1.0)
Monocytes Relative: 7 %
Neutro Abs: 9.3 10*3/uL — ABNORMAL HIGH (ref 1.7–7.7)
Neutrophils Relative %: 56 %
Platelet Count: 85 10*3/uL — ABNORMAL LOW (ref 150–400)
RBC: 4.63 MIL/uL (ref 4.22–5.81)
RDW: 13.5 % (ref 11.5–15.5)
WBC Count: 16.5 10*3/uL — ABNORMAL HIGH (ref 4.0–10.5)
nRBC: 0.2 % (ref 0.0–0.2)

## 2020-01-24 LAB — LACTATE DEHYDROGENASE: LDH: 417 U/L — ABNORMAL HIGH (ref 98–192)

## 2020-01-24 MED ORDER — OXYCODONE HCL 5 MG PO TABS: 5.0000 mg | ORAL_TABLET | Freq: Four times a day (QID) | ORAL | 0 refills | Status: DC | PRN

## 2020-01-24 MED ORDER — SODIUM CHLORIDE 0.9 % IV SOLN
375.0000 mg/m2 | Freq: Once | INTRAVENOUS | Status: AC
  Administered 2020-01-24: 760 mg via INTRAVENOUS
  Filled 2020-01-24: qty 76

## 2020-01-24 MED ORDER — PALONOSETRON HCL INJECTION 0.25 MG/5ML
INTRAVENOUS | Status: AC
  Filled 2020-01-24: qty 5

## 2020-01-24 MED ORDER — VINBLASTINE SULFATE CHEMO INJECTION 1 MG/ML
5.9500 mg/m2 | Freq: Once | INTRAVENOUS | Status: AC
  Administered 2020-01-24: 12 mg via INTRAVENOUS
  Filled 2020-01-24: qty 12

## 2020-01-24 MED ORDER — INFLUENZA VAC SPLIT QUAD 0.5 ML IM SUSY
PREFILLED_SYRINGE | INTRAMUSCULAR | Status: AC
  Filled 2020-01-24: qty 0.5

## 2020-01-24 MED ORDER — SODIUM CHLORIDE 0.9% FLUSH
10.0000 mL | INTRAVENOUS | Status: DC | PRN
  Administered 2020-01-24: 10 mL via INTRAVENOUS
  Filled 2020-01-24: qty 10

## 2020-01-24 MED ORDER — SODIUM CHLORIDE 0.9 % IV SOLN
Freq: Once | INTRAVENOUS | Status: AC
  Filled 2020-01-24: qty 250

## 2020-01-24 MED ORDER — SODIUM CHLORIDE 0.9 % IV SOLN
10.0000 mg | Freq: Once | INTRAVENOUS | Status: AC
  Administered 2020-01-24: 10 mg via INTRAVENOUS
  Filled 2020-01-24: qty 10

## 2020-01-24 MED ORDER — CITALOPRAM HYDROBROMIDE 10 MG PO TABS: 10.0000 mg | ORAL_TABLET | Freq: Every day | ORAL | 2 refills | Status: DC

## 2020-01-24 MED ORDER — DOXORUBICIN HCL CHEMO IV INJECTION 2 MG/ML
25.0000 mg/m2 | Freq: Once | INTRAVENOUS | Status: AC
  Administered 2020-01-24: 50 mg via INTRAVENOUS
  Filled 2020-01-24: qty 25

## 2020-01-24 MED ORDER — SODIUM CHLORIDE 0.9 % IV SOLN
10.0000 [IU]/m2 | Freq: Once | INTRAVENOUS | Status: AC
  Administered 2020-01-24: 20 [IU] via INTRAVENOUS
  Filled 2020-01-24: qty 6.67

## 2020-01-24 MED ORDER — INFLUENZA VAC SPLIT QUAD 0.5 ML IM SUSY
0.5000 mL | PREFILLED_SYRINGE | Freq: Once | INTRAMUSCULAR | Status: AC
  Administered 2020-01-24: 0.5 mL via INTRAMUSCULAR

## 2020-01-24 MED ORDER — SODIUM CHLORIDE 0.9 % IV SOLN
150.0000 mg | Freq: Once | INTRAVENOUS | Status: AC
  Administered 2020-01-24: 150 mg via INTRAVENOUS
  Filled 2020-01-24: qty 150

## 2020-01-24 MED ORDER — SODIUM CHLORIDE 0.9% FLUSH
10.0000 mL | INTRAVENOUS | Status: DC | PRN
  Administered 2020-01-24: 10 mL
  Filled 2020-01-24: qty 10

## 2020-01-24 MED ORDER — PALONOSETRON HCL INJECTION 0.25 MG/5ML
0.2500 mg | Freq: Once | INTRAVENOUS | Status: AC
  Administered 2020-01-24: 0.25 mg via INTRAVENOUS

## 2020-01-24 MED ORDER — HEPARIN SOD (PORK) LOCK FLUSH 100 UNIT/ML IV SOLN
500.0000 [IU] | Freq: Once | INTRAVENOUS | Status: AC | PRN
  Administered 2020-01-24: 500 [IU]
  Filled 2020-01-24: qty 5

## 2020-01-24 NOTE — Patient Instructions (Signed)

## 2020-01-24 NOTE — Patient Instructions (Addendum)
[facility] Discharge Instructions for Patients Receiving Chemotherapy  Today you received the following chemotherapy agents: Doxorubicin (ADRIAMYCIN), Vinblastine (VELBAN), Bleomycin (BLEOCIN) & Dacarbazine (DTIC).  To help prevent nausea and vomiting after your treatment, we encourage you to take your nausea medication as directed.   If you develop nausea and vomiting that is not controlled by your nausea medication, call the clinic.   BELOW ARE SYMPTOMS THAT SHOULD BE REPORTED IMMEDIATELY:  *FEVER GREATER THAN 100.5 F  *CHILLS WITH OR WITHOUT FEVER  NAUSEA AND VOMITING THAT IS NOT CONTROLLED WITH YOUR NAUSEA MEDICATION  *UNUSUAL SHORTNESS OF BREATH  *UNUSUAL BRUISING OR BLEEDING  TENDERNESS IN MOUTH AND THROAT WITH OR WITHOUT PRESENCE OF ULCERS  *URINARY PROBLEMS  *BOWEL PROBLEMS  UNUSUAL RASH Items with * indicate a potential emergency and should be followed up as soon as possible.  Feel free to call the clinic should you have any questions or concerns. The clinic phone number is [phone].  Please show the [place] at check-in to the Emergency Department and triage nurse.  Influenza Virus Vaccine injection What is this medicine? INFLUENZA VIRUS VACCINE (in floo EN zuh VAHY ruhs vak SEEN) helps to reduce the risk of getting influenza also known as the flu. The vaccine only helps protect you against some strains of the flu. This medicine may be used for other purposes; ask your health care provider or pharmacist if you have questions. COMMON BRAND NAME(S): Afluria, Afluria Quadrivalent, Agriflu, Alfuria, FLUAD, Fluarix, Fluarix Quadrivalent, Flublok, Flublok Quadrivalent, FLUCELVAX, FLUCELVAX Quadrivalent, Flulaval, Flulaval Quadrivalent, Fluvirin, Fluzone, Fluzone High-Dose, Fluzone Intradermal, Fluzone Quadrivalent What should I tell my health care provider before I take this medicine? They need to know if you have any of these  conditions:  bleeding disorder like hemophilia  fever or infection  Guillain-Barre syndrome or other neurological problems  immune system problems  infection with the human immunodeficiency virus (HIV) or AIDS  low blood platelet counts  multiple sclerosis  an unusual or allergic reaction to influenza virus vaccine, latex, other medicines, foods, dyes, or preservatives. Different brands of vaccines contain different allergens. Some may contain latex or eggs. Talk to your doctor about your allergies to make sure that you get the right vaccine.  pregnant or trying to get pregnant  breast-feeding How should I use this medicine? This vaccine is for injection into a muscle or under the skin. It is given by a health care professional. A copy of Vaccine Information Statements will be given before each vaccination. Read this sheet carefully each time. The sheet may change frequently. Talk to your healthcare provider to see which vaccines are right for you. Some vaccines should not be used in all age groups. Overdosage: If you think you have taken too much of this medicine contact a poison control center or emergency room at once. NOTE: This medicine is only for you. Do not share this medicine with others. What if I miss a dose? This does not apply. What may interact with this medicine?  chemotherapy or radiation therapy  medicines that lower your immune system like etanercept, anakinra, infliximab, and adalimumab  medicines that treat or prevent blood clots like warfarin  phenytoin  steroid medicines like prednisone or cortisone  theophylline  vaccines This list may not describe all possible interactions. Give your health care provider a list of all the medicines, herbs, non-prescription drugs, or dietary supplements you use. Also tell them if you smoke, drink alcohol, or use illegal drugs. Some  items may interact with your medicine. What should I watch for while using this  medicine? Report any side effects that do not go away within 3 days to your doctor or health care professional. Call your health care provider if any unusual symptoms occur within 6 weeks of receiving this vaccine. You may still catch the flu, but the illness is not usually as bad. You cannot get the flu from the vaccine. The vaccine will not protect against colds or other illnesses that may cause fever. The vaccine is needed every year. What side effects may I notice from receiving this medicine? Side effects that you should report to your doctor or health care professional as soon as possible:  allergic reactions like skin rash, itching or hives, swelling of the face, lips, or tongue Side effects that usually do not require medical attention (report to your doctor or health care professional if they continue or are bothersome):  fever  headache  muscle aches and pains  pain, tenderness, redness, or swelling at the injection site  tiredness This list may not describe all possible side effects. Call your doctor for medical advice about side effects. You may report side effects to FDA at 1-800-FDA-1088. Where should I keep my medicine? The vaccine will be given by a health care professional in a clinic, pharmacy, doctor's office, or other health care setting. You will not be given vaccine doses to store at home. NOTE: This sheet is a summary. It may not cover all possible information. If you have questions about this medicine, talk to your doctor, pharmacist, or health care provider.  2020 [employer]vier/Gold Standard (2018-03-08 08:45:43)

## 2020-01-24 NOTE — Progress Notes (Signed)
OK to treat with platelet count of 85K per Dr. Lorenso Courier

## 2020-01-24 NOTE — Progress Notes (Signed)
Patient reminded to come in of Friday 01/26/2020 for his injection at 12:30 pm and her verbalized understanding.

## 2020-01-25 ENCOUNTER — Telehealth: Payer: Self-pay | Admitting: *Deleted

## 2020-01-25 NOTE — Telephone Encounter (Signed)
Received call from patient. He is asking about one of his lab results from yesterday-his elevated LDH.  Per Dr. Lorenso Courier, this is related to his elevated WBC count (from his GCSF injection after his last treatment)  Advised that he need not worry about that, that Dr. Eulis Manly is not surprised that it is elevated -the LDH. As his WBC comes down, his LDH should con=me down as well. 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  Martin Spencer voiced understanding. He states he feels well after his third chemotherapy treatment he received yesterday.

## 2020-01-26 ENCOUNTER — Inpatient Hospital Stay: Payer: BC Managed Care – PPO | Attending: Hematology and Oncology

## 2020-01-26 ENCOUNTER — Other Ambulatory Visit: Payer: Self-pay

## 2020-01-26 VITALS — BP 121/71 | HR 71 | Temp 98.5°F | Resp 18

## 2020-01-26 DIAGNOSIS — Z5111 Encounter for antineoplastic chemotherapy: Secondary | ICD-10-CM | POA: Diagnosis not present

## 2020-01-26 DIAGNOSIS — C8112 Nodular sclerosis classical Hodgkin lymphoma, intrathoracic lymph nodes: Secondary | ICD-10-CM | POA: Diagnosis not present

## 2020-01-26 DIAGNOSIS — D709 Neutropenia, unspecified: Secondary | ICD-10-CM | POA: Insufficient documentation

## 2020-01-26 DIAGNOSIS — Z5189 Encounter for other specified aftercare: Secondary | ICD-10-CM | POA: Diagnosis not present

## 2020-01-26 MED ORDER — PEGFILGRASTIM-CBQV 6 MG/0.6ML ~~LOC~~ SOSY
PREFILLED_SYRINGE | SUBCUTANEOUS | Status: AC
  Filled 2020-01-26: qty 0.6

## 2020-01-26 MED ORDER — PEGFILGRASTIM-CBQV 6 MG/0.6ML ~~LOC~~ SOSY
6.0000 mg | PREFILLED_SYRINGE | Freq: Once | SUBCUTANEOUS | Status: AC
  Administered 2020-01-26: 6 mg via SUBCUTANEOUS

## 2020-01-26 NOTE — Patient Instructions (Signed)

## 2020-01-30 ENCOUNTER — Ambulatory Visit: Payer: BC Managed Care – PPO

## 2020-01-30 ENCOUNTER — Other Ambulatory Visit: Payer: Self-pay

## 2020-01-30 ENCOUNTER — Inpatient Hospital Stay: Payer: BC Managed Care – PPO

## 2020-01-30 ENCOUNTER — Telehealth: Payer: Self-pay | Admitting: *Deleted

## 2020-01-30 DIAGNOSIS — C8112 Nodular sclerosis classical Hodgkin lymphoma, intrathoracic lymph nodes: Secondary | ICD-10-CM

## 2020-01-30 DIAGNOSIS — Z5111 Encounter for antineoplastic chemotherapy: Secondary | ICD-10-CM | POA: Diagnosis not present

## 2020-01-30 DIAGNOSIS — D709 Neutropenia, unspecified: Secondary | ICD-10-CM | POA: Diagnosis not present

## 2020-01-30 DIAGNOSIS — Z5189 Encounter for other specified aftercare: Secondary | ICD-10-CM | POA: Diagnosis not present

## 2020-01-30 LAB — CMP (CANCER CENTER ONLY)
ALT: 33 U/L (ref 0–44)
AST: 17 U/L (ref 15–41)
Albumin: 4 g/dL (ref 3.5–5.0)
Alkaline Phosphatase: 131 U/L — ABNORMAL HIGH (ref 38–126)
Anion gap: 8 (ref 5–15)
BUN: 13 mg/dL (ref 6–20)
CO2: 28 mmol/L (ref 22–32)
Calcium: 9.7 mg/dL (ref 8.9–10.3)
Chloride: 103 mmol/L (ref 98–111)
Creatinine: 0.77 mg/dL (ref 0.61–1.24)
GFR, Estimated: >60 mL/min (ref >60)
Glucose, Bld: 113 mg/dL — ABNORMAL HIGH (ref 70–99)
Potassium: 3.8 mmol/L (ref 3.5–5.1)
Sodium: 139 mmol/L (ref 135–145)
Total Bilirubin: 1 mg/dL (ref 0.3–1.2)
Total Protein: 7 g/dL (ref 6.5–8.1)

## 2020-01-30 LAB — CBC WITH DIFFERENTIAL (CANCER CENTER ONLY)
Abs Immature Granulocytes: 0.12 10*3/uL — ABNORMAL HIGH (ref 0.00–0.07)
Basophils Absolute: 0.1 10*3/uL (ref 0.0–0.1)
Basophils Relative: 1 %
Eosinophils Absolute: 0 10*3/uL (ref 0.0–0.5)
Eosinophils Relative: 0 %
HCT: 37 % — ABNORMAL LOW (ref 39.0–52.0)
Hemoglobin: 13.1 g/dL (ref 13.0–17.0)
Immature Granulocytes: 1 %
Lymphocytes Relative: 11 %
Lymphs Abs: 1.8 10*3/uL (ref 0.7–4.0)
MCH: 29.1 pg (ref 26.0–34.0)
MCHC: 35.4 g/dL (ref 30.0–36.0)
MCV: 82.2 fL (ref 80.0–100.0)
Monocytes Absolute: 0.4 10*3/uL (ref 0.1–1.0)
Monocytes Relative: 3 %
Neutro Abs: 13.5 10*3/uL — ABNORMAL HIGH (ref 1.7–7.7)
Neutrophils Relative %: 84 %
Platelet Count: 228 10*3/uL (ref 150–400)
RBC: 4.5 MIL/uL (ref 4.22–5.81)
RDW: 13.3 % (ref 11.5–15.5)
WBC Count: 15.8 10*3/uL — ABNORMAL HIGH (ref 4.0–10.5)
nRBC: 0 % (ref 0.0–0.2)

## 2020-01-30 LAB — LACTATE DEHYDROGENASE: LDH: 220 U/L — ABNORMAL HIGH (ref 98–192)

## 2020-01-30 NOTE — Telephone Encounter (Signed)
Received call from patient inquiring about lab results from today.  Reviewed labs with him, assuring him that his platelet count has rebounded and other labs are good as well. Pt voiced understanding

## 2020-02-05 ENCOUNTER — Other Ambulatory Visit: Payer: Self-pay | Admitting: Hematology and Oncology

## 2020-02-05 ENCOUNTER — Ambulatory Visit (HOSPITAL_COMMUNITY)
Admission: RE | Admit: 2020-02-05 | Discharge: 2020-02-05 | Disposition: A | Discharge status: Home / Self Care | Payer: BC Managed Care – PPO | Source: Ambulatory Visit | Attending: Hematology and Oncology | Admitting: Hematology and Oncology

## 2020-02-05 ENCOUNTER — Other Ambulatory Visit: Payer: Self-pay

## 2020-02-05 ENCOUNTER — Telehealth: Payer: Self-pay | Admitting: *Deleted

## 2020-02-05 DIAGNOSIS — C8112 Nodular sclerosis classical Hodgkin lymphoma, intrathoracic lymph nodes: Secondary | ICD-10-CM

## 2020-02-05 DIAGNOSIS — I10 Essential (primary) hypertension: Secondary | ICD-10-CM | POA: Diagnosis not present

## 2020-02-05 DIAGNOSIS — C8590 Non-Hodgkin lymphoma, unspecified, unspecified site: Secondary | ICD-10-CM | POA: Diagnosis not present

## 2020-02-05 NOTE — Telephone Encounter (Signed)
Received call from patient. He states he is having a new symptom that is concerning to him. He states his lungs feel tight when he takes a deep breath, like he as asthma. He states he also feels some chest pain on his left chest-similar to when he he had pericarditis. No dizziness, fevers, chills. No leg swelling or pain in calves.  He also states he went kayaking this weekend as he figured out it was more detrimental for him to sit around the house, worrying about everything.  Dr. Lorenso Courier made aware of the above.

## 2020-02-05 NOTE — Telephone Encounter (Signed)
Per Dr. Lorenso Courier, pt needs a chest x ray due to his pulmonary symptoms.  TCT patient and spoke with him. Advised he needs a chest xray as one of his chemotherapies can affect his lungs.  Martin Spencer voiced understanding and states he will come to [facility] today for chest xray.  Advised that we would call him back after Dr. Lorenso Courier sees the xray results.  Grainger voiced understanding.

## 2020-02-07 ENCOUNTER — Inpatient Hospital Stay: Payer: BC Managed Care – PPO

## 2020-02-07 ENCOUNTER — Inpatient Hospital Stay (HOSPITAL_BASED_OUTPATIENT_CLINIC_OR_DEPARTMENT_OTHER): Payer: BC Managed Care – PPO | Admitting: Hematology and Oncology

## 2020-02-07 ENCOUNTER — Other Ambulatory Visit: Payer: Self-pay | Admitting: Hematology and Oncology

## 2020-02-07 ENCOUNTER — Other Ambulatory Visit: Payer: Self-pay

## 2020-02-07 ENCOUNTER — Other Ambulatory Visit: Payer: BC Managed Care – PPO

## 2020-02-07 ENCOUNTER — Encounter: Payer: Self-pay | Admitting: Hematology and Oncology

## 2020-02-07 VITALS — BP 116/77 | HR 88 | Temp 96.7°F | Resp 17 | Ht 71.0 in | Wt 181.2 lb

## 2020-02-07 DIAGNOSIS — Z5111 Encounter for antineoplastic chemotherapy: Secondary | ICD-10-CM | POA: Diagnosis not present

## 2020-02-07 DIAGNOSIS — C8112 Nodular sclerosis classical Hodgkin lymphoma, intrathoracic lymph nodes: Secondary | ICD-10-CM

## 2020-02-07 DIAGNOSIS — Z5189 Encounter for other specified aftercare: Secondary | ICD-10-CM | POA: Diagnosis not present

## 2020-02-07 DIAGNOSIS — R590 Localized enlarged lymph nodes: Secondary | ICD-10-CM

## 2020-02-07 DIAGNOSIS — Z95828 Presence of other vascular implants and grafts: Secondary | ICD-10-CM

## 2020-02-07 DIAGNOSIS — D709 Neutropenia, unspecified: Secondary | ICD-10-CM | POA: Diagnosis not present

## 2020-02-07 LAB — CMP (CANCER CENTER ONLY)
ALT: 54 U/L — ABNORMAL HIGH (ref 0–44)
AST: 23 U/L (ref 15–41)
Albumin: 4.2 g/dL (ref 3.5–5.0)
Alkaline Phosphatase: 102 U/L (ref 38–126)
Anion gap: 5 (ref 5–15)
BUN: 14 mg/dL (ref 6–20)
CO2: 28 mmol/L (ref 22–32)
Calcium: 9.7 mg/dL (ref 8.9–10.3)
Chloride: 105 mmol/L (ref 98–111)
Creatinine: 0.99 mg/dL (ref 0.61–1.24)
GFR, Estimated: >60 mL/min (ref >60)
Glucose, Bld: 121 mg/dL — ABNORMAL HIGH (ref 70–99)
Potassium: 3.8 mmol/L (ref 3.5–5.1)
Sodium: 138 mmol/L (ref 135–145)
Total Bilirubin: 0.8 mg/dL (ref 0.3–1.2)
Total Protein: 7.2 g/dL (ref 6.5–8.1)

## 2020-02-07 LAB — CBC WITH DIFFERENTIAL (CANCER CENTER ONLY)
Abs Immature Granulocytes: 2.65 10*3/uL — ABNORMAL HIGH (ref 0.00–0.07)
Basophils Absolute: 0.1 10*3/uL (ref 0.0–0.1)
Basophils Relative: 0 %
Eosinophils Absolute: 0.1 10*3/uL (ref 0.0–0.5)
Eosinophils Relative: 1 %
HCT: 38 % — ABNORMAL LOW (ref 39.0–52.0)
Hemoglobin: 13.2 g/dL (ref 13.0–17.0)
Immature Granulocytes: 15 %
Lymphocytes Relative: 17 %
Lymphs Abs: 3 10*3/uL (ref 0.7–4.0)
MCH: 29 pg (ref 26.0–34.0)
MCHC: 34.7 g/dL (ref 30.0–36.0)
MCV: 83.5 fL (ref 80.0–100.0)
Monocytes Absolute: 0.9 10*3/uL (ref 0.1–1.0)
Monocytes Relative: 5 %
Neutro Abs: 10.8 10*3/uL — ABNORMAL HIGH (ref 1.7–7.7)
Neutrophils Relative %: 62 %
Platelet Count: 190 10*3/uL (ref 150–400)
RBC: 4.55 MIL/uL (ref 4.22–5.81)
RDW: 14.8 % (ref 11.5–15.5)
WBC Count: 17.4 10*3/uL — ABNORMAL HIGH (ref 4.0–10.5)
nRBC: 0.1 % (ref 0.0–0.2)

## 2020-02-07 LAB — LACTATE DEHYDROGENASE: LDH: 272 U/L — ABNORMAL HIGH (ref 98–192)

## 2020-02-07 MED ORDER — SODIUM CHLORIDE 0.9% FLUSH
10.0000 mL | INTRAVENOUS | Status: DC | PRN
  Administered 2020-02-07: 10 mL
  Filled 2020-02-07: qty 10

## 2020-02-07 MED ORDER — SODIUM CHLORIDE 0.9 % IV SOLN
Freq: Once | INTRAVENOUS | Status: AC
  Filled 2020-02-07: qty 250

## 2020-02-07 MED ORDER — PALONOSETRON HCL INJECTION 0.25 MG/5ML
0.2500 mg | Freq: Once | INTRAVENOUS | Status: AC
  Administered 2020-02-07: 0.25 mg via INTRAVENOUS

## 2020-02-07 MED ORDER — SODIUM CHLORIDE 0.9 % IV SOLN
10.0000 [IU]/m2 | Freq: Once | INTRAVENOUS | Status: AC
  Administered 2020-02-07: 20 [IU] via INTRAVENOUS
  Filled 2020-02-07: qty 6.67

## 2020-02-07 MED ORDER — PALONOSETRON HCL INJECTION 0.25 MG/5ML
INTRAVENOUS | Status: AC
  Filled 2020-02-07: qty 5

## 2020-02-07 MED ORDER — HEPARIN SOD (PORK) LOCK FLUSH 100 UNIT/ML IV SOLN
500.0000 [IU] | Freq: Once | INTRAVENOUS | Status: AC | PRN
  Administered 2020-02-07: 500 [IU]
  Filled 2020-02-07: qty 5

## 2020-02-07 MED ORDER — TRAMADOL HCL 50 MG PO TABS: 50.0000 mg | ORAL_TABLET | Freq: Four times a day (QID) | ORAL | 0 refills | Status: DC | PRN

## 2020-02-07 MED ORDER — DOXORUBICIN HCL CHEMO IV INJECTION 2 MG/ML
25.0000 mg/m2 | Freq: Once | INTRAVENOUS | Status: AC
  Administered 2020-02-07: 50 mg via INTRAVENOUS
  Filled 2020-02-07: qty 25

## 2020-02-07 MED ORDER — VINBLASTINE SULFATE CHEMO INJECTION 1 MG/ML
5.9500 mg/m2 | Freq: Once | INTRAVENOUS | Status: AC
  Administered 2020-02-07: 12 mg via INTRAVENOUS
  Filled 2020-02-07: qty 12

## 2020-02-07 MED ORDER — SODIUM CHLORIDE 0.9 % IV SOLN
10.0000 mg | Freq: Once | INTRAVENOUS | Status: AC
  Administered 2020-02-07: 10 mg via INTRAVENOUS
  Filled 2020-02-07: qty 10

## 2020-02-07 MED ORDER — SODIUM CHLORIDE 0.9 % IV SOLN
150.0000 mg | Freq: Once | INTRAVENOUS | Status: AC
  Administered 2020-02-07: 150 mg via INTRAVENOUS
  Filled 2020-02-07: qty 150

## 2020-02-07 MED ORDER — SODIUM CHLORIDE 0.9 % IV SOLN
375.0000 mg/m2 | Freq: Once | INTRAVENOUS | Status: AC
  Administered 2020-02-07: 760 mg via INTRAVENOUS
  Filled 2020-02-07: qty 76

## 2020-02-07 NOTE — Patient Instructions (Addendum)
[facility] Discharge Instructions for Patients Receiving Chemotherapy  Today you received the following chemotherapy agents: Doxorubicin (ADRIAMYCIN), Vinblastine (VELBAN), Bleomycin (BLEOCIN) & Dacarbazine (DTIC).  To help prevent nausea and vomiting after your treatment, we encourage you to take your nausea medication as directed.   If you develop nausea and vomiting that is not controlled by your nausea medication, call the clinic.   BELOW ARE SYMPTOMS THAT SHOULD BE REPORTED IMMEDIATELY:  *FEVER GREATER THAN 100.5 F  *CHILLS WITH OR WITHOUT FEVER  NAUSEA AND VOMITING THAT IS NOT CONTROLLED WITH YOUR NAUSEA MEDICATION  *UNUSUAL SHORTNESS OF BREATH  *UNUSUAL BRUISING OR BLEEDING  TENDERNESS IN MOUTH AND THROAT WITH OR WITHOUT PRESENCE OF ULCERS  *URINARY PROBLEMS  *BOWEL PROBLEMS  UNUSUAL RASH Items with * indicate a potential emergency and should be followed up as soon as possible.  Feel free to call the clinic should you have any questions or concerns. The clinic phone number is [phone].  Please show the [place] at check-in to the Emergency Department and triage nurse.

## 2020-02-07 NOTE — Patient Instructions (Signed)

## 2020-02-07 NOTE — Progress Notes (Signed)
[place] [phone]   [phone]  PROGRESS NOTE  Patient Care Team: Patient, No Pcp Per as PCP - General (General Practice) Elouise Munroe, MD as PCP - Cardiology (Cardiology)  Hematological/Oncological History # Classical Hodgkin Lymphoma, Stage I.  Favorable Disease 1) 11/17/2019: presented to the emergency department with chest pain. CXR performed showed right paratracheal density. CT recommended. CT angiogram showed diffuse enlargement of the thymus gland with associated mild mediastinal adenopathy including right paratracheal enlarged lymph node corresponding to radiographic abnormality 2) 11/19/2019: CT abdomen performed showing subtle mass versus steatosis within the caudate lobe of liver; due to history of thoracic adenopathy and thymic infiltration, follow-up non emergent MR imaging of the liver with and without contrast recommended to exclude hepatic mass 3) 11/24/2019: PET CT scan performed showing hypermetabolic mediastinal adenopathy, consistent with lymphoma. ([place] [facility]). No extrathoracic disease 4) 11/30/2019: EBUS performed, failed to provide adequate tissue for diagnosis 5) 12/08/2019: mediastinoscopy performed, biopsy confirmed Hodgkin's lymphoma.  6) 12/28/2019 Cycle 1 Day 1 of ABVD Chemotherapy  7) 01/10/2020: Cycle 1 Day 15 of ABVD Chemotherapy  8) 01/23/2020: Cycle 2 Day 1 of ABVD Chemotherapy  9) 02/07/2020: Cycle 2 Day 15 of ABVD Chemotherapy   Interval History:  Martin Spencer 29 y.o. male with medical history significant for classical Hodgkin Lymphoma who presents for a follow up visit. The patient was last seen on 01/24/2020 to start Cycle 2 Day 1 of treatment. He presents today prior to Cycle 2 Day 15 of chemotherapy. In the interim he had an episode of chest tightness prompting a CXR (which was without abnormalities).   On exam today Martin Spencer is accompanied by his father.  Reports that his symptoms over the weekend have  subsequently resolved.  He describes the sensation as a tightness in his throat that radiated down throughout his chest.  He also notes that he was having some discomfort in the upper portion of his abdomen.  This was not associated with any cough, fevers, chills, sweats, nausea, vomiting or diarrhea.  He has been otherwise quite well with a good appetite.  He has been having issues with feeling unwell emotionally, believes that the Celexa medication is helping.  A full 10 point ROS is listed below.  MEDICAL HISTORY:  Past Medical History:  Diagnosis Date  . ADD (attention deficit disorder)   . Cancer ([place])   . Childhood asthma   . Pericarditis   . Shortness of breath    per patient, since recent diagnoses of pericarditis, sometimes get SOB on rest   SURGICAL HISTORY: Past Surgical History:  Procedure Laterality Date  . APPENDECTOMY    . FINE NEEDLE ASPIRATION  11/30/2019   Procedure: FINE NEEDLE ASPIRATION (FNA) LINEAR;  Surgeon: Candee Furbish, MD;  Location: [facility] ENDOSCOPY;  Service: Pulmonary;;  . IR IMAGING GUIDED PORT INSERTION  12/26/2019  . LAPAROSCOPIC APPENDECTOMY N/A 11/14/2017   Procedure: APPENDECTOMY LAPAROSCOPIC;  Surgeon: Georganna Skeans, MD;  Location: [place];  Service: General;  Laterality: N/A;  . MEDIASTINOSCOPY N/A 12/08/2019   Procedure: MEDIASTINOSCOPY;  Surgeon: Melrose Nakayama, MD;  Location: [place];  Service: Thoracic;  Laterality: N/A;  . VIDEO BRONCHOSCOPY WITH ENDOBRONCHIAL ULTRASOUND N/A 11/30/2019   Procedure: VIDEO BRONCHOSCOPY WITH ENDOBRONCHIAL ULTRASOUND;  Surgeon: Candee Furbish, MD;  Location: [facility] ENDOSCOPY;  Service: Pulmonary;  Laterality: N/A;  . WISDOM TOOTH EXTRACTION      SOCIAL HISTORY: Social History   Socioeconomic History  . Marital status: Single  Spouse name: Not on file  . Number of children: Not on file  . Years of education: Not on file  . Highest education level: Not on file  Occupational History  . Not on file  Tobacco Use  .  Smoking status: Former Smoker    Types: Cigarettes  . Smokeless tobacco: Never Used  . Tobacco comment: quit in high school  Vaping Use  . Vaping Use: Never used  Substance and Sexual Activity  . Alcohol use: Yes    Comment: per patient "rarely"  . Drug use: Not Currently    Types: Marijuana    Comment: CBD last use 2 weeks ago ( 11/29/19)  . Sexual activity: Not on file  Other Topics Concern  . Not on file  Social History Narrative  . Not on file   Social Determinants of Health   Financial Resource Strain:   . Difficulty of Paying Living Expenses: Not on file  Food Insecurity:   . Worried About Charity fundraiser in the Last Year: Not on file  . Ran Out of Food in the Last Year: Not on file  Transportation Needs:   . Lack of Transportation (Medical): Not on file  . Lack of Transportation (Non-Medical): Not on file  Physical Activity:   . Days of Exercise per Week: Not on file  . Minutes of Exercise per Session: Not on file  Stress:   . Feeling of Stress : Not on file  Social Connections:   . Frequency of Communication with Friends and Family: Not on file  . Frequency of Social Gatherings with Friends and Family: Not on file  . Attends Religious Services: Not on file  . Active Member of Clubs or Organizations: Not on file  . Attends Archivist Meetings: Not on file  . Marital Status: Not on file  Intimate Partner Violence:   . Fear of Current or Ex-Partner: Not on file  . Emotionally Abused: Not on file  . Physically Abused: Not on file  . Sexually Abused: Not on file    FAMILY HISTORY: Family History  Problem Relation Age of Onset  . Kidney cancer Father   . Colon cancer Paternal Grandfather     ALLERGIES:  is allergic to other.  MEDICATIONS:  Current Outpatient Medications  Medication Sig Dispense Refill  . acetaminophen (TYLENOL) 500 MG tablet Take 500 mg by mouth every 6 (six) hours as needed for mild pain or headache.    . citalopram (CELEXA)  10 MG tablet Take 1 tablet (10 mg total) by mouth daily. 30 tablet 2  . ibuprofen (ADVIL) 200 MG tablet Take 600 mg by mouth every 6 (six) hours as needed for headache or mild pain.    Marland Kitchen lidocaine-prilocaine (EMLA) cream Apply 1 application topically as needed. (Patient taking differently: Apply 1 application topically as needed (port). ) 30 g 1  . omeprazole (PRILOSEC) 40 MG capsule Take 1 capsule (40 mg total) by mouth daily. (Patient not taking: Reported on 01/16/2020) 30 capsule 0  . ondansetron (ZOFRAN) 8 MG tablet Take 1 tablet (8 mg total) by mouth every 8 (eight) hours as needed for nausea or vomiting. 20 tablet 1  . oxyCODONE (OXY IR/ROXICODONE) 5 MG immediate release tablet Take 1 tablet (5 mg total) by mouth every 6 (six) hours as needed for severe pain. 10 tablet 0  . prochlorperazine (COMPAZINE) 10 MG tablet Take 1 tablet (10 mg total) by mouth every 6 (six) hours as needed for nausea  or vomiting. 30 tablet 1   No current facility-administered medications for this visit.    REVIEW OF SYSTEMS:   Constitutional: ( - ) fevers, ( - )  chills , ( - ) night sweats Eyes: ( - ) blurriness of vision, ( - ) double vision, ( - ) watery eyes Ears, nose, mouth, throat, and face: ( - ) mucositis, ( - ) sore throat Respiratory: ( - ) cough, ( - ) dyspnea, ( - ) wheezes Cardiovascular: ( - ) palpitation, ( - ) chest discomfort, ( - ) lower extremity swelling Gastrointestinal:  ( - ) nausea, ( - ) heartburn, ( - ) change in bowel habits Skin: ( - ) abnormal skin rashes Lymphatics: ( - ) new lymphadenopathy, ( - ) easy bruising Neurological: ( - ) numbness, ( - ) tingling, ( - ) new weaknesses Behavioral/Psych: ( - ) mood change, ( - ) new changes  All other systems were reviewed with the patient and are negative.  PHYSICAL EXAMINATION: ECOG PERFORMANCE STATUS: 0 - Asymptomatic  Vitals:   02/07/20 1006  BP: 116/77  Pulse: 88  Resp: 17  Temp: (!) 96.7 F (35.9 C)  SpO2: 100%   Filed  Weights   02/07/20 1006  Weight: 181 lb 3.2 oz (82.2 kg)    GENERAL: well appearing fit young Caucasian male. alert, no distress and comfortable SKIN: skin color, texture, turgor are normal, no rashes or significant lesions. Small surgical incision at base of neck, well healed.  EYES: conjunctiva are pink and non-injected, sclera clear LUNGS: clear to auscultation and percussion with normal breathing effort HEART: regular rate & rhythm and no murmurs and no lower extremity edema Musculoskeletal: no cyanosis of digits and no clubbing  PSYCH: alert & oriented x 3, fluent speech NEURO: no focal motor/sensory deficits  LABORATORY DATA:  I have reviewed the data as listed CBC Latest Ref Rng & Units 02/07/2020 01/30/2020 01/24/2020  WBC 4.0 - 10.5 K/uL 17.4(H) 15.8(H) 16.5(H)  Hemoglobin 13.0 - 17.0 g/dL 13.2 13.1 13.2  Hematocrit 39 - 52 % 38.0(L) 37.0(L) 38.1(L)  Platelets 150 - 400 K/uL 190 228 85(L)    CMP Latest Ref Rng & Units 02/07/2020 01/30/2020 01/24/2020  Glucose 70 - 99 mg/dL 121(H) 113(H) 97  BUN 6 - 20 mg/dL 14 13 12   Creatinine 0.61 - 1.24 mg/dL 0.99 0.77 0.85  Sodium 135 - 145 mmol/L 138 139 138  Potassium 3.5 - 5.1 mmol/L 3.8 3.8 4.0  Chloride 98 - 111 mmol/L 105 103 105  CO2 22 - 32 mmol/L 28 28 28   Calcium 8.9 - 10.3 mg/dL 9.7 9.7 9.4  Total Protein 6.5 - 8.1 g/dL 7.2 7.0 7.0  Total Bilirubin 0.3 - 1.2 mg/dL 0.8 1.0 0.6  Alkaline Phos 38 - 126 U/L 102 131(H) 89  AST 15 - 41 U/L 23 17 32  ALT 0 - 44 U/L 54(H) 33 60(H)    RADIOGRAPHIC STUDIES: I have personally review the images and agree with the findings below: marked FDG avid mediastinal lymph nodes. Mild uptake under left arm (prior COVID vaccine). No disease noted elsewhere  DG Chest 2 View  Result Date: 02/06/2020 CLINICAL DATA:  29 y.o. outpatient w/ hodgkin lymphoma (11/2019). Per pt tightness in throat / upper chest upon inspiration. EXAM: CHEST - 2 VIEW COMPARISON:  Chest radiograph 01/16/2020 FINDINGS:  Right chest port is stable in appearance. Stable cardiomediastinal contours. The lungs are clear. No pneumothorax or pleural effusion. No acute finding in the  visualized skeleton. IMPRESSION: No acute cardiopulmonary process. Electronically Signed   By: Audie Pinto M.D.   On: 02/06/2020 15:41   CT ABDOMEN PELVIS W CONTRAST  Result Date: 01/16/2020 CLINICAL DATA:  Bilateral flank pain for 1 day EXAM: CT ABDOMEN AND PELVIS WITH CONTRAST TECHNIQUE: Multidetector CT imaging of the abdomen and pelvis was performed using the standard protocol following bolus administration of intravenous contrast. CONTRAST:  142mL OMNIPAQUE IOHEXOL 300 MG/ML  SOLN COMPARISON:  11/19/2019 FINDINGS: Lower chest:  No contributory findings. Hepatobiliary: No focal liver abnormality.No evidence of biliary obstruction or stone. Pancreas: Unremarkable. Spleen: Unremarkable. Adrenals/Urinary Tract: Negative adrenals. No hydronephrosis or stone. Unremarkable bladder. Stomach/Bowel: No obstruction. Appendectomy. No bowel obstruction or visible inflammation. Vascular/Lymphatic: No acute vascular abnormality. No mass or adenopathy. Reproductive:No pathologic findings. Other: Scant pelvic fluid without visible cause, nonspecific in isolation. Musculoskeletal: No acute abnormalities. IMPRESSION: 1. No specific explanation for symptoms. There is trace pelvic fluid without visible inflammation or adenopathy. 2. Appendectomy. Electronically Signed   By: Monte Fantasia M.D.   On: 01/16/2020 07:33   DG Chest Port 1 View  Result Date: 01/16/2020 CLINICAL DATA:  Flank pain.  Fever.  History of lymphoma. EXAM: PORTABLE CHEST 1 VIEW COMPARISON:  Chest x-ray 12/06/2019.  PET-CT 11/24/2019. FINDINGS: PowerPort catheter noted with tip over cavoatrial junction. Mediastinal prominence consistent with known adenopathy noted, less obvious on today's exam. No focal infiltrate. No pleural effusion or pneumothorax. Heart size normal. No acute bony  abnormality. IMPRESSION: 1. PowerPort catheter with tip over cavoatrial junction. No pneumothorax. 2. Mediastinal prominence consistent with known adenopathy again noted, less obvious on today's exam. No acute cardiopulmonary disease identified. Electronically Signed   By: Marcello Moores  Register   On: 01/16/2020 06:20    ASSESSMENT & PLAN Martin Spencer 29 y.o. male with medical history significant for classical Hodgkin Lymphoma stage I who presents for a follow up visit. Today is Cycle 2 Day 15 of chemotherapy.   On exam today Martin Spencer is tolerating chemotherapy well overall.  He did have an episode of chest tightness and shortness of breath over the weekend when kayaking and we obtained a chest x-ray to assure no bleomycin toxicity.  Chest x-ray was perfectly normal.  He is not having any cough, fevers, chills, sweats.  He is clear for treatment with chemotherapy today.  Previously we discussed the patient has a stage I classical Hodgkin's lymphoma, favorable disease.  He is young and otherwise fit and therefore would be able to tolerate full ABVD chemotherapy.  We discussed expected side effects including nausea, vomiting, constipation, nerve damage, fertility loss, hair loss, and possible secondary malignancy.  The patient and his parents voiced their understanding of the reasoning behind obtaining PFTs, chemotherapy education, port placement, and fertility preservation at [facility].  They have the opportunity to ask all questions and concerns regarding treatment moving forward.  The regimen of choice for this stage I classical Hodgkin's lymphoma, favorable disease would consist of ABVD chemotherapy.  ABVD is comprised of doxorubicin 25 mg/m IV, bleomycin 10 units/m IV, vinblastine 6 g/m IV, and dacarbazine 375 mg/m IV all to be administered on days 1 and 15 of a 28-day cycle.  After 2 cycles the patient is to undergo a PET CT scan at which time treatment moving forward can be  dictated by response to therapy.  # Classical Hodgkin Lymphoma, Stage I.  Favorable Disease.  --Cycle 2 Day 15 ABVD chemotherapy today --patient has completed PFTs, Port, and fertility  preservation. TTE performed on 11/18/2019 (EF 65%)  --discussed the risks and benefits of treatment. Additionally noted that we will re-evaluate with a PET CT scan after 4 doses (2 cycles) of chemotherapy. PET ordered today.  --Will have patient return to clinic q 2 weeks with continued chemotherapy with interval PET CT scan  #Neutropenia, resolved -ANC robust today, still have leukocytosis from last dosing of GCSF --will proceed with chemotherapy today after we discussed the risks and benefits. He was agreeable to proceeding with treatment --HOLD GCSF support for the current cycle  #Thrombocytopenia, resolved --Plt rebounded to normal levels  --continue to monitor  # Symptom Management -- prescribed zofran 8mg  PO q8H PRN and compazine 10mg  q6H PRN.  --EMLA cream for port -- assure daily BM while on therapy with vincristine. Assure patient has senna docusate and miralax on hand during treatment.  --encourage claratin/ibuprofen with GCSF shot. GCSF held this cycle (Cycle 2 Day 15)  --strict return precautions for shortness of breath, cough, fevers.   No orders of the defined types were placed in this encounter.  All questions were answered. The patient knows to call the clinic with any problems, questions or concerns.  A total of more than 30 minutes were spent on this encounter and over half of that time was spent on counseling and coordination of care as outlined above.   Ledell Peoples, MD Department of Hematology/Oncology [place] at [facility] Phone: [phone] Pager: [phone] Email: [email or web address]  02/07/2020 10:46 AM   Literature Support:  Sharion Dove, et al. The use of filgrastim in patients with Hodgkin lymphoma receiving  ABVD. Int J Hematol Oncol Stem Cell Res. 2017;11(4):286-292.  --This study does not find evidence that the combination of bleomycin and G-CSF increases the risk for bleomycin- induced pulmonary toxicity. We recommend G-CSF use in HL patients receiving bleomycin when needed to maintain dose intensity.

## 2020-02-08 ENCOUNTER — Encounter: Payer: Self-pay | Admitting: Internal Medicine

## 2020-02-08 ENCOUNTER — Ambulatory Visit (INDEPENDENT_AMBULATORY_CARE_PROVIDER_SITE_OTHER): Payer: BC Managed Care – PPO | Admitting: Internal Medicine

## 2020-02-08 VITALS — BP 94/60 | HR 73 | Ht 71.0 in | Wt 179.6 lb

## 2020-02-08 DIAGNOSIS — R079 Chest pain, unspecified: Secondary | ICD-10-CM | POA: Diagnosis not present

## 2020-02-08 DIAGNOSIS — I309 Acute pericarditis, unspecified: Secondary | ICD-10-CM | POA: Diagnosis not present

## 2020-02-08 NOTE — Patient Instructions (Signed)
Medication Instructions:  No Changes In Medications at this time.  *If you need a refill on your cardiac medications before your next appointment, please call your pharmacy*  Lab Work: None Ordered At This Time.  If you have labs (blood work) drawn today and your tests are completely normal, you will receive your results only by: . MyChart Message (if you have MyChart) OR . A paper copy in the mail If you have any lab test that is abnormal or we need to change your treatment, we will call you to review the results.  Testing/Procedures: None Ordered At This Time.   Follow-Up: At [facility], you and your health needs are our priority.  As part of our continuing mission to provide you with exceptional heart care, we have created designated Provider Care Teams.  These Care Teams include your primary Cardiologist (physician) and Advanced Practice Providers (APPs -  Physician Assistants and Nurse Practitioners) who all work together to provide you with the care you need, when you need it.  Your next appointment:   3 month(s)  The format for your next appointment:   In Person  Provider:   Gayatri Acharya, MD  

## 2020-02-08 NOTE — Progress Notes (Signed)
Cardiology Office Note:    Date:  02/08/2020   ID:  Alona Bene, DOB 06/26/1990, MRN [id number]  PCP:  Patient, No Pcp Per  Cardiologist:  Elouise Munroe, MD  Electrophysiologist:  None   Referring MD: No ref. provider found   Chief Complaint/Reason for Referral: pericarditis  History of Present Illness:    Martin Spencer is a 29 y.o. 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   He has overall been doing well with regard to chest pain. Currently undergoing treatment for lymphoma directed by Dr. Flonnie Hailstone. Had an episode of throat tightness and mild chest discomfort. Overall, no episodes of pain have been reminiscent of pain that lead to initial hospitalization. He has stopped both colchicine and ibuprofen.   CXR obtained by Dr. Lorenso Courier to screen for bleomycin toxicity reviewed from a cardiovascular perspective by patient and father's request during office visit. No cardiac silhouette abnormalities.   Past Medical History:  Diagnosis Date  . ADD (attention deficit disorder)   . Cancer ([place])   . Childhood asthma   . Pericarditis   . Shortness of breath    per patient, since recent diagnoses of pericarditis, sometimes get SOB on rest    Past Surgical History:  Procedure Laterality Date  . APPENDECTOMY    . FINE NEEDLE ASPIRATION  11/30/2019   Procedure: FINE NEEDLE ASPIRATION (FNA) LINEAR;  Surgeon: Candee Furbish, MD;  Location: [facility] ENDOSCOPY;  Service: Pulmonary;;  . IR IMAGING GUIDED PORT INSERTION  12/26/2019  . LAPAROSCOPIC APPENDECTOMY N/A 11/14/2017   Procedure: APPENDECTOMY LAPAROSCOPIC;  Surgeon: Georganna Skeans, MD;  Location: [place];  Service: General;  Laterality: N/A;  . MEDIASTINOSCOPY N/A 12/08/2019   Procedure: MEDIASTINOSCOPY;  Surgeon: Melrose Nakayama, MD;  Location: [place];  Service: Thoracic;  Laterality: N/A;  . VIDEO BRONCHOSCOPY WITH ENDOBRONCHIAL  ULTRASOUND N/A 11/30/2019   Procedure: VIDEO BRONCHOSCOPY WITH ENDOBRONCHIAL ULTRASOUND;  Surgeon: Candee Furbish, MD;  Location: [facility] ENDOSCOPY;  Service: Pulmonary;  Laterality: N/A;  . WISDOM TOOTH EXTRACTION      Current Medications: Current Meds  Medication Sig  . acetaminophen (TYLENOL) 500 MG tablet Take 500 mg by mouth every 6 (six) hours as needed for mild pain or headache.  . citalopram (CELEXA) 10 MG tablet Take 1 tablet (10 mg total) by mouth daily.  Marland Kitchen ibuprofen (ADVIL) 200 MG tablet Take 600 mg by mouth every 6 (six) hours as needed for headache or mild pain.  Marland Kitchen lidocaine-prilocaine (EMLA) cream Apply 1 application topically as needed. (Patient taking differently: Apply 1 application topically as needed (port). )  . ondansetron (ZOFRAN) 8 MG tablet Take 1 tablet (8 mg total) by mouth every 8 (eight) hours as needed for nausea or vomiting.  . prochlorperazine (COMPAZINE) 10 MG tablet Take 1 tablet (10 mg total) by mouth every 6 (six) hours as needed for nausea or vomiting.  . traMADol (ULTRAM) 50 MG tablet Take 1 tablet (50 mg total) by mouth every 6 (six) hours as needed for moderate pain.  . [DISCONTINUED] omeprazole (PRILOSEC) 40 MG capsule Take 1 capsule (40 mg total) by mouth daily.  . [DISCONTINUED] oxyCODONE (OXY IR/ROXICODONE) 5 MG immediate release tablet Take 1 tablet (5 mg total) by mouth every 6 (six) hours as needed for severe pain.     Allergies:   Other   Social History   Tobacco Use  . Smoking status: Former Smoker  Types: Cigarettes  . Smokeless tobacco: Never Used  . Tobacco comment: quit in high school  Vaping Use  . Vaping Use: Never used  Substance Use Topics  . Alcohol use: Yes    Comment: per patient "rarely"  . Drug use: Not Currently    Types: Marijuana    Comment: CBD last use 2 weeks ago ( 11/29/19)     Family History: The patient's family history includes Colon cancer in his paternal grandfather; Kidney cancer in his father.  ROS:     Please see the history of present illness.    All other systems reviewed and are negative.  EKGs/Labs/Other Studies Reviewed:    The following studies were reviewed today:  I have independently reviewed the images from CXR 02/05/20.  Recent Labs: 11/18/2019: TSH 1.728 02/07/2020: ALT 54; BUN 14; Creatinine 0.99; Hemoglobin 13.2; Platelet Count 190; Potassium 3.8; Sodium 138  Recent Lipid Panel No results found for: CHOL, TRIG, HDL, CHOLHDL, VLDL, LDLCALC, LDLDIRECT  Physical Exam:    VS:  BP 94/60   Pulse 73   Ht 5\' 11"  (1.803 m)   Wt 179 lb 9.6 oz (81.5 kg)   SpO2 99%   BMI 25.05 kg/m     Wt Readings from Last 5 Encounters:  02/08/20 179 lb 9.6 oz (81.5 kg)  02/07/20 181 lb 3.2 oz (82.2 kg)  01/24/20 184 lb 11.2 oz (83.8 kg)  01/16/20 179 lb 14.3 oz (81.6 kg)  01/10/20 179 lb 14.4 oz (81.6 kg)    Constitutional: No acute distress Cardiovascular: regular rhythm, normal rate, no murmurs. S1 and S2 normal. Radial pulses normal bilaterally. No jugular venous distention.  Respiratory: clear to auscultation bilaterally GI : normal bowel sounds, soft and nontender. No distention.   MSK: extremities warm, well perfused. No edema.  NEURO: grossly nonfocal exam, moves all extremities. PSYCH: alert and oriented x 3, normal mood and affect.   ASSESSMENT:    1. Acute pericarditis, unspecified type   2. Chest pain, unspecified type    PLAN:    OK to discontinue treatment for acute pericarditis and monitor. We discussed pain may have been from pericarditis but may have also been contributed to by lymphadenopathy from lymphoma. Improved rapidly with treatment of pericarditis and no recurrence of similar sounding chest pain.   Will monitor closely for symptoms.   Reviewed cardiovascular care and follow up plan in detail.   Total time of encounter: 30 minutes total time of encounter, including 20 minutes spent in face-to-face patient care on the date of this encounter. This time  includes coordination of care and counseling regarding above mentioned problem list. Remainder of non-face-to-face time involved reviewing chart documents/testing relevant to the patient encounter and documentation in the medical record. I have independently reviewed documentation from referring provider.   Cherlynn Kaiser, MD [place]  [facility]    Medication Adjustments/Labs and Tests Ordered: Current medicines are reviewed at length with the patient today.  Concerns regarding medicines are outlined above.   No orders of the defined types were placed in this encounter.   No orders of the defined types were placed in this encounter.   Patient Instructions  Medication Instructions:  No Changes In Medications at this time.  *If you need a refill on your cardiac medications before your next appointment, please call your pharmacy*  Lab Work: None Ordered At This Time.  If you have labs (blood work) drawn today and your tests are completely normal, you will receive your  results only by: Marland Kitchen MyChart Message (if you have MyChart) OR . A paper copy in the mail If you have any lab test that is abnormal or we need to change your treatment, we will call you to review the results.  Testing/Procedures: None Ordered At This Time.   Follow-Up: At [facility], you and your health needs are our priority.  As part of our continuing mission to provide you with exceptional heart care, we have created designated Provider Care Teams.  These Care Teams include your primary Cardiologist (physician) and Advanced Practice Providers (APPs -  Physician Assistants and Nurse Practitioners) who all work together to provide you with the care you need, when you need it.  Your next appointment:   3 month(s)  The format for your next appointment:   In Person  Provider:   Cherlynn Kaiser, MD

## 2020-02-12 ENCOUNTER — Telehealth: Payer: Self-pay | Admitting: *Deleted

## 2020-02-12 ENCOUNTER — Other Ambulatory Visit: Payer: Self-pay | Admitting: Hematology and Oncology

## 2020-02-12 NOTE — Telephone Encounter (Signed)
Received call from patient this afternoon. He states he has developed a rash (starting yesterday, 02/11/20) on his neck, chest and wrists. He states it is reddish, raised patches that are somewhat itchy. Spoke with Dr. Lorenso Courier about this. OK for pt to be seen in [facility] tomorrow but that pt could take Benadryl 25mg  and Pepcid 20 mg tonight to help with the itching/discomfort. Pt voiced understanding.  Scheduling request sent for [facility] tomorrow and for chemo next week. No labs needed for tomorrow

## 2020-02-13 ENCOUNTER — Inpatient Hospital Stay (HOSPITAL_BASED_OUTPATIENT_CLINIC_OR_DEPARTMENT_OTHER): Payer: BC Managed Care – PPO | Admitting: Medical

## 2020-02-13 ENCOUNTER — Other Ambulatory Visit: Payer: Self-pay

## 2020-02-13 VITALS — BP 121/72 | HR 80 | Temp 97.8°F | Resp 18 | Ht 71.0 in | Wt 183.1 lb

## 2020-02-13 DIAGNOSIS — C8112 Nodular sclerosis classical Hodgkin lymphoma, intrathoracic lymph nodes: Secondary | ICD-10-CM

## 2020-02-13 DIAGNOSIS — Z5111 Encounter for antineoplastic chemotherapy: Secondary | ICD-10-CM | POA: Diagnosis not present

## 2020-02-13 DIAGNOSIS — Z5189 Encounter for other specified aftercare: Secondary | ICD-10-CM | POA: Diagnosis not present

## 2020-02-13 DIAGNOSIS — D709 Neutropenia, unspecified: Secondary | ICD-10-CM | POA: Diagnosis not present

## 2020-02-13 DIAGNOSIS — R21 Rash and other nonspecific skin eruption: Secondary | ICD-10-CM | POA: Diagnosis not present

## 2020-02-13 MED ORDER — TRIAMCINOLONE ACETONIDE 0.1 % EX LOTN: 1.0000 "application " | TOPICAL_LOTION | Freq: Three times a day (TID) | CUTANEOUS | 1 refills | Status: DC

## 2020-02-16 NOTE — Progress Notes (Signed)
Symptoms Management Clinic Progress Note   Martin Spencer [id number] 1990-09-25 29 y.o.  Martin Spencer is managed by Dr. 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 Pallone is status post cycle 3, day 15 of ABVD chemotherapy dosed on 02/07/2020.  He is scheduled to have restaging scans completed on 02/19/2028 and then will see Dr. Narda Rutherford in follow-up on 02/23/2020.  Rash of the abdomen, chest, and left forearm consistent with poison ivy: The patient was given a prescription for triamcinolone lotion to use.  Please see After Visit Summary for patient specific instructions.  Future Appointments  Date Time Provider [place]  02/19/2020 11:00 AM [facility] PET CT 1 [facility] [place]  02/23/2020 10:45 AM [facility] LAB [facility] None  02/23/2020 11:00 AM [facility] [place] FLUSH [facility] None  02/23/2020 11:30 AM Ledell Peoples IV, MD [facility] None  02/23/2020 12:00 PM [facility] INFUSION [facility] None  03/07/2020 10:15 AM [facility] LAB [facility] None  03/07/2020 10:30 AM [facility] INFUSION [facility] None  03/07/2020 11:00 AM Orson Slick, MD [facility] None  03/07/2020 11:45 AM [facility] INFUSION [facility] None  03/20/2020 10:00 AM [facility] LAB [facility] None  03/20/2020 10:15 AM [facility] [place] FLUSH [facility] None  03/20/2020 10:45 AM Orson Slick, MD [facility] None  03/20/2020 11:45 AM [facility] INFUSION [facility] None  04/03/2020 10:15 AM [facility] LAB [facility] None  04/03/2020 10:30 AM [facility] [place] FLUSH [facility] None    04/03/2020 11:00 AM Orson Slick, MD [facility] None  04/03/2020 12:00 PM [facility] INFUSION [facility] None  05/10/2020  8:00 AM Elouise Munroe, MD [facility]    No orders of the defined types were placed in this encounter.      Subjective:   Patient ID:  Martin Spencer is a 29 y.o. (DOB 1990/08/15) male.  Chief Complaint: No chief complaint on file.   HPI Martin Spencer  is a 29 y.o. 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  He is followed by Dr. Narda Rutherford and is status post cycle 3, day 15 of ABVD chemotherapy dosed on 02/07/2020.  He is scheduled to have restaging scans completed on 02/19/2028 and then will see Dr. Narda Rutherford in follow-up on 02/23/2020.  He presents to the clinic today with a report of a rash on his abdomen, chest, and left anterior forearm.  He denies any changes in activity or exposure to anything new.  His energy level is low with chemotherapy but he denies fevers, chills, sweats, or other issues of concern.  He continues to have anxiety associated with his diagnosis.  Medications: I have reviewed the patient's current medications.  Allergies:  Allergies  Allergen Reactions  . Other Other (See Comments)    Cats-sneezing,red itchy eyes    Past Medical History:  Diagnosis Date  . ADD (attention deficit disorder)   . Cancer ([place])   . Childhood asthma   . Pericarditis   . Shortness of breath    per patient, since recent diagnoses of pericarditis, sometimes get SOB on rest    Past Surgical History:  Procedure Laterality Date  . APPENDECTOMY    .  FINE NEEDLE ASPIRATION  11/30/2019   Procedure: FINE NEEDLE ASPIRATION (FNA) LINEAR;  Surgeon: Candee Furbish, MD;  Location: [facility] ENDOSCOPY;  Service: Pulmonary;;  . IR IMAGING GUIDED PORT INSERTION  12/26/2019  . LAPAROSCOPIC APPENDECTOMY N/A 11/14/2017   Procedure: APPENDECTOMY LAPAROSCOPIC;  Surgeon: Georganna Skeans, MD;  Location:  [place];  Service: General;  Laterality: N/A;  . MEDIASTINOSCOPY N/A 12/08/2019   Procedure: MEDIASTINOSCOPY;  Surgeon: Melrose Nakayama, MD;  Location: [place];  Service: Thoracic;  Laterality: N/A;  . VIDEO BRONCHOSCOPY WITH ENDOBRONCHIAL ULTRASOUND N/A 11/30/2019   Procedure: VIDEO BRONCHOSCOPY WITH ENDOBRONCHIAL ULTRASOUND;  Surgeon: Candee Furbish, MD;  Location: [facility] ENDOSCOPY;  Service: Pulmonary;  Laterality: N/A;  . WISDOM TOOTH EXTRACTION      Family History  Problem Relation Age of Onset  . Kidney cancer Father   . Colon cancer Paternal Grandfather     Social History   Socioeconomic History  . Marital status: Single    Spouse name: Not on file  . Number of children: Not on file  . Years of education: Not on file  . Highest education level: Not on file  Occupational History  . Not on file  Tobacco Use  . Smoking status: Former Smoker    Types: Cigarettes  . Smokeless tobacco: Never Used  . Tobacco comment: quit in high school  Vaping Use  . Vaping Use: Never used  Substance and Sexual Activity  . Alcohol use: Yes    Comment: per patient "rarely"  . Drug use: Not Currently    Types: Marijuana    Comment: CBD last use 2 weeks ago ( 11/29/19)  . Sexual activity: Not on file  Other Topics Concern  . Not on file  Social History Narrative  . Not on file   Social Determinants of Health   Financial Resource Strain:   . Difficulty of Paying Living Expenses: Not on file  Food Insecurity:   . Worried About Charity fundraiser in the Last Year: Not on file  . Ran Out of Food in the Last Year: Not on file  Transportation Needs:   . Lack of Transportation (Medical): Not on file  . Lack of Transportation (Non-Medical): Not on file  Physical Activity:   . Days of Exercise per Week: Not on file  . Minutes of Exercise per Session: Not on file  Stress:   . Feeling of Stress : Not on file  Social Connections:   . Frequency of Communication with Friends and Family: Not on  file  . Frequency of Social Gatherings with Friends and Family: Not on file  . Attends Religious Services: Not on file  . Active Member of Clubs or Organizations: Not on file  . Attends Archivist Meetings: Not on file  . Marital Status: Not on file  Intimate Partner Violence:   . Fear of Current or Ex-Partner: Not on file  . Emotionally Abused: Not on file  . Physically Abused: Not on file  . Sexually Abused: Not on file    Past Medical History, Surgical history, Social history, and Family history were reviewed and updated as appropriate.   Please see review of systems for further details on the patient's review from today.   Review of Systems:  Review of Systems  Constitutional: Negative for chills, diaphoresis and fever.  HENT: Negative for facial swelling and trouble swallowing.   Respiratory: Negative for cough, chest tightness and shortness of breath.   Cardiovascular: Negative for chest  pain.  Skin: Positive for rash.    Objective:   Physical Exam:  BP 121/72 (BP Location: Left Arm, Patient Position: Sitting)   Pulse 80   Temp 97.8 F (36.6 C) (Tympanic)   Resp 18   Ht 5\' 11"  (1.803 m)   Wt 183 lb 1.6 oz (83.1 kg)   SpO2 100%   BMI 25.54 kg/m  ECOG: 0  Physical Exam Constitutional:      General: He is not in acute distress.    Appearance: He is not diaphoretic.  HENT:     Head: Normocephalic and atraumatic.  Cardiovascular:     Rate and Rhythm: Normal rate and regular rhythm.     Heart sounds: Normal heart sounds. No murmur heard.  No friction rub. No gallop.   Pulmonary:     Effort: Pulmonary effort is normal. No respiratory distress.     Breath sounds: Normal breath sounds. No stridor. No wheezing or rales.  Musculoskeletal:        General: No deformity.  Skin:    General: Skin is warm and dry.     Findings: Erythema and rash present.     Comments: Multiple raised individual 3 to 4 mm lesions noted over the chest, left abdomen, and left  anterior forearm.  No drainage, erythema, or increased warmth noted.  Neurological:     Mental Status: He is alert.     Coordination: Coordination normal.     Gait: Gait normal.     Lab Review:     Component Value Date/Time   NA 138 02/07/2020 0955   K 3.8 02/07/2020 0955   CL 105 02/07/2020 0955   CO2 28 02/07/2020 0955   GLUCOSE 121 (H) 02/07/2020 0955   BUN 14 02/07/2020 0955   CREATININE 0.99 02/07/2020 0955   CALCIUM 9.7 02/07/2020 0955   PROT 7.2 02/07/2020 0955   ALBUMIN 4.2 02/07/2020 0955   AST 23 02/07/2020 0955   ALT 54 (H) 02/07/2020 0955   ALKPHOS 102 02/07/2020 0955   BILITOT 0.8 02/07/2020 0955   GFRNONAA >60 02/07/2020 0955   GFRAA >60 01/24/2020 0944       Component Value Date/Time   WBC 17.4 (H) 02/07/2020 0955   WBC 11.6 (H) 01/16/2020 0617   RBC 4.55 02/07/2020 0955   HGB 13.2 02/07/2020 0955   HCT 38.0 (L) 02/07/2020 0955   PLT 190 02/07/2020 0955   MCV 83.5 02/07/2020 0955   MCH 29.0 02/07/2020 0955   MCHC 34.7 02/07/2020 0955   RDW 14.8 02/07/2020 0955   LYMPHSABS 3.0 02/07/2020 0955   MONOABS 0.9 02/07/2020 0955   EOSABS 0.1 02/07/2020 0955   BASOSABS 0.1 02/07/2020 0955   -------------------------------  Imaging from last 24 hours (if applicable):  Radiology interpretation: DG Chest 2 View  Result Date: 02/06/2020 CLINICAL DATA:  29 y.o. outpatient w/ hodgkin lymphoma (11/2019). Per pt tightness in throat / upper chest upon inspiration. EXAM: CHEST - 2 VIEW COMPARISON:  Chest radiograph 01/16/2020 FINDINGS: Right chest port is stable in appearance. Stable cardiomediastinal contours. The lungs are clear. No pneumothorax or pleural effusion. No acute finding in the visualized skeleton. IMPRESSION: No acute cardiopulmonary process. Electronically Signed   By: Audie Pinto M.D.   On: 02/06/2020 15:41

## 2020-02-17 DIAGNOSIS — Z20822 Contact with and (suspected) exposure to covid-19: Secondary | ICD-10-CM | POA: Diagnosis not present

## 2020-02-17 DIAGNOSIS — R059 Cough, unspecified: Secondary | ICD-10-CM | POA: Diagnosis not present

## 2020-02-19 ENCOUNTER — Other Ambulatory Visit: Payer: Self-pay

## 2020-02-19 ENCOUNTER — Ambulatory Visit (HOSPITAL_COMMUNITY)
Admission: RE | Admit: 2020-02-19 | Discharge: 2020-02-19 | Disposition: A | Discharge status: Home / Self Care | Payer: BC Managed Care – PPO | Source: Ambulatory Visit | Attending: Hematology and Oncology | Admitting: Hematology and Oncology

## 2020-02-19 DIAGNOSIS — J9859 Other diseases of mediastinum, not elsewhere classified: Secondary | ICD-10-CM | POA: Diagnosis not present

## 2020-02-19 DIAGNOSIS — C8112 Nodular sclerosis classical Hodgkin lymphoma, intrathoracic lymph nodes: Secondary | ICD-10-CM | POA: Diagnosis not present

## 2020-02-19 DIAGNOSIS — R590 Localized enlarged lymph nodes: Secondary | ICD-10-CM | POA: Diagnosis not present

## 2020-02-19 DIAGNOSIS — C8590 Non-Hodgkin lymphoma, unspecified, unspecified site: Secondary | ICD-10-CM | POA: Diagnosis not present

## 2020-02-19 LAB — GLUCOSE, CAPILLARY: Glucose-Capillary: 88 mg/dL (ref 70–99)

## 2020-02-19 MED ORDER — FLUDEOXYGLUCOSE F - 18 (FDG) INJECTION
9.1000 | Freq: Once | INTRAVENOUS | Status: AC | PRN
  Administered 2020-02-19: 9.1 via INTRAVENOUS

## 2020-02-20 ENCOUNTER — Telehealth: Payer: Self-pay | Admitting: *Deleted

## 2020-02-20 NOTE — Telephone Encounter (Signed)
Received call from patient regarding results of his PET that was done yesterday. Advised that Dr. Lorenso Courier was not in the office and that I would have him call patient tomorrow. Pt states he read the scan results on MyChart. Advised that the scan results do say that the tumor in his chest has had a significant decrease in size - as we expected at this point in his treatment. Assured him that Dr. Lorenso Courier would call him tomorrow. Pt had his mother on the call as well. Advised that any further information on treatment plan would come from Dr. Lorenso Courier. Pt very anxious. Much reassurance provided.  Message sent to Dr. Lorenso Courier to call pt tomorrow

## 2020-02-23 ENCOUNTER — Other Ambulatory Visit: Payer: Self-pay | Admitting: *Deleted

## 2020-02-23 ENCOUNTER — Inpatient Hospital Stay: Payer: BC Managed Care – PPO

## 2020-02-23 ENCOUNTER — Other Ambulatory Visit: Payer: Self-pay

## 2020-02-23 ENCOUNTER — Inpatient Hospital Stay: Payer: BC Managed Care – PPO | Admitting: Hematology and Oncology

## 2020-02-23 ENCOUNTER — Other Ambulatory Visit: Payer: Self-pay | Admitting: Hematology and Oncology

## 2020-02-23 VITALS — BP 131/74 | HR 74 | Temp 96.9°F | Resp 20 | Ht 71.0 in | Wt 178.4 lb

## 2020-02-23 DIAGNOSIS — C8112 Nodular sclerosis classical Hodgkin lymphoma, intrathoracic lymph nodes: Secondary | ICD-10-CM

## 2020-02-23 DIAGNOSIS — Z95828 Presence of other vascular implants and grafts: Secondary | ICD-10-CM

## 2020-02-23 DIAGNOSIS — R590 Localized enlarged lymph nodes: Secondary | ICD-10-CM

## 2020-02-23 DIAGNOSIS — Z5111 Encounter for antineoplastic chemotherapy: Secondary | ICD-10-CM | POA: Diagnosis not present

## 2020-02-23 DIAGNOSIS — Z5189 Encounter for other specified aftercare: Secondary | ICD-10-CM | POA: Diagnosis not present

## 2020-02-23 DIAGNOSIS — D709 Neutropenia, unspecified: Secondary | ICD-10-CM | POA: Diagnosis not present

## 2020-02-23 LAB — CMP (CANCER CENTER ONLY)
ALT: 34 U/L (ref 0–44)
AST: 24 U/L (ref 15–41)
Albumin: 4.4 g/dL (ref 3.5–5.0)
Alkaline Phosphatase: 55 U/L (ref 38–126)
Anion gap: 6 (ref 5–15)
BUN: 7 mg/dL (ref 6–20)
CO2: 26 mmol/L (ref 22–32)
Calcium: 9.6 mg/dL (ref 8.9–10.3)
Chloride: 106 mmol/L (ref 98–111)
Creatinine: 0.85 mg/dL (ref 0.61–1.24)
GFR, Estimated: >60 mL/min (ref >60)
Glucose, Bld: 89 mg/dL (ref 70–99)
Potassium: 3.8 mmol/L (ref 3.5–5.1)
Sodium: 138 mmol/L (ref 135–145)
Total Bilirubin: 1.5 mg/dL — ABNORMAL HIGH (ref 0.3–1.2)
Total Protein: 7.1 g/dL (ref 6.5–8.1)

## 2020-02-23 LAB — CBC WITH DIFFERENTIAL (CANCER CENTER ONLY)
Abs Immature Granulocytes: 0 10*3/uL (ref 0.00–0.07)
Basophils Absolute: 0 10*3/uL (ref 0.0–0.1)
Basophils Relative: 1 %
Eosinophils Absolute: 0 10*3/uL (ref 0.0–0.5)
Eosinophils Relative: 1 %
HCT: 38.3 % — ABNORMAL LOW (ref 39.0–52.0)
Hemoglobin: 13.3 g/dL (ref 13.0–17.0)
Immature Granulocytes: 0 %
Lymphocytes Relative: 42 %
Lymphs Abs: 1.2 10*3/uL (ref 0.7–4.0)
MCH: 29.6 pg (ref 26.0–34.0)
MCHC: 34.7 g/dL (ref 30.0–36.0)
MCV: 85.1 fL (ref 80.0–100.0)
Monocytes Absolute: 0.5 10*3/uL (ref 0.1–1.0)
Monocytes Relative: 17 %
Neutro Abs: 1.1 10*3/uL — ABNORMAL LOW (ref 1.7–7.7)
Neutrophils Relative %: 39 %
Platelet Count: 302 10*3/uL (ref 150–400)
RBC: 4.5 MIL/uL (ref 4.22–5.81)
RDW: 15.8 % — ABNORMAL HIGH (ref 11.5–15.5)
WBC Count: 2.8 10*3/uL — ABNORMAL LOW (ref 4.0–10.5)
nRBC: 0 % (ref 0.0–0.2)

## 2020-02-23 LAB — LACTATE DEHYDROGENASE: LDH: 131 U/L (ref 98–192)

## 2020-02-23 MED ORDER — SODIUM CHLORIDE 0.9% FLUSH
10.0000 mL | INTRAVENOUS | Status: DC | PRN
  Administered 2020-02-23: 10 mL
  Filled 2020-02-23: qty 10

## 2020-02-23 MED ORDER — PALONOSETRON HCL INJECTION 0.25 MG/5ML
INTRAVENOUS | Status: AC
  Filled 2020-02-23: qty 5

## 2020-02-23 MED ORDER — VINBLASTINE SULFATE CHEMO INJECTION 1 MG/ML
5.9500 mg/m2 | Freq: Once | INTRAVENOUS | Status: AC
  Administered 2020-02-23: 12 mg via INTRAVENOUS
  Filled 2020-02-23: qty 12

## 2020-02-23 MED ORDER — HEPARIN SOD (PORK) LOCK FLUSH 100 UNIT/ML IV SOLN
500.0000 [IU] | Freq: Once | INTRAVENOUS | Status: AC | PRN
  Administered 2020-02-23: 500 [IU]
  Filled 2020-02-23: qty 5

## 2020-02-23 MED ORDER — SODIUM CHLORIDE 0.9 % IV SOLN
Freq: Once | INTRAVENOUS | Status: AC
  Filled 2020-02-23: qty 250

## 2020-02-23 MED ORDER — DOXORUBICIN HCL CHEMO IV INJECTION 2 MG/ML
25.0000 mg/m2 | Freq: Once | INTRAVENOUS | Status: AC
  Administered 2020-02-23: 50 mg via INTRAVENOUS
  Filled 2020-02-23: qty 25

## 2020-02-23 MED ORDER — SODIUM CHLORIDE 0.9% FLUSH
10.0000 mL | INTRAVENOUS | Status: DC | PRN
  Filled 2020-02-23: qty 10

## 2020-02-23 MED ORDER — SODIUM CHLORIDE 0.9 % IV SOLN
10.0000 mg | Freq: Once | INTRAVENOUS | Status: AC
  Administered 2020-02-23: 10 mg via INTRAVENOUS
  Filled 2020-02-23: qty 10

## 2020-02-23 MED ORDER — PALONOSETRON HCL INJECTION 0.25 MG/5ML
0.2500 mg | Freq: Once | INTRAVENOUS | Status: AC
  Administered 2020-02-23: 0.25 mg via INTRAVENOUS

## 2020-02-23 MED ORDER — SODIUM CHLORIDE 0.9 % IV SOLN
150.0000 mg | Freq: Once | INTRAVENOUS | Status: AC
  Administered 2020-02-23: 150 mg via INTRAVENOUS
  Filled 2020-02-23: qty 150

## 2020-02-23 MED ORDER — SODIUM CHLORIDE 0.9 % IV SOLN
375.0000 mg/m2 | Freq: Once | INTRAVENOUS | Status: AC
  Administered 2020-02-23: 760 mg via INTRAVENOUS
  Filled 2020-02-23: qty 76

## 2020-02-23 MED ORDER — SODIUM CHLORIDE 0.9 % IV SOLN
10.0000 [IU]/m2 | Freq: Once | INTRAVENOUS | Status: AC
  Administered 2020-02-23: 20 [IU] via INTRAVENOUS
  Filled 2020-02-23: qty 6.67

## 2020-02-23 NOTE — Patient Instructions (Signed)
[facility] Discharge Instructions for Patients Receiving Chemotherapy  Today you received the following chemotherapy agents Adriamycin, Vinblastine, Bleomycin, and DTIC  To help prevent nausea and vomiting after your treatment, we encourage you to take your nausea medication as directed   If you develop nausea and vomiting that is not controlled by your nausea medication, call the clinic.   BELOW ARE SYMPTOMS THAT SHOULD BE REPORTED IMMEDIATELY:  *FEVER GREATER THAN 100.5 F  *CHILLS WITH OR WITHOUT FEVER  NAUSEA AND VOMITING THAT IS NOT CONTROLLED WITH YOUR NAUSEA MEDICATION  *UNUSUAL SHORTNESS OF BREATH  *UNUSUAL BRUISING OR BLEEDING  TENDERNESS IN MOUTH AND THROAT WITH OR WITHOUT PRESENCE OF ULCERS  *URINARY PROBLEMS  *BOWEL PROBLEMS  UNUSUAL RASH Items with * indicate a potential emergency and should be followed up as soon as possible.  Feel free to call the clinic should you have any questions or concerns. The clinic phone number is [phone].  Please show the [place] at check-in to the Emergency Department and triage nurse.

## 2020-02-23 NOTE — Progress Notes (Signed)
OK to treat with ANC of 1/1 and TBili of 1.5 per Dr. Lorenso Courier

## 2020-02-23 NOTE — Progress Notes (Signed)
ambul;a 

## 2020-02-23 NOTE — Progress Notes (Signed)
[place] [phone]   [phone]  PROGRESS NOTE  Patient Care Team: Patient, No Pcp Per as PCP - General (General Practice) Elouise Munroe, MD as PCP - Cardiology (Cardiology)  Hematological/Oncological History # Classical Hodgkin Lymphoma, Stage I.  Favorable Disease 1) 11/17/2019: presented to the emergency department with chest pain. CXR performed showed right paratracheal density. CT recommended. CT angiogram showed diffuse enlargement of the thymus gland with associated mild mediastinal adenopathy including right paratracheal enlarged lymph node corresponding to radiographic abnormality 2) 11/19/2019: CT abdomen performed showing subtle mass versus steatosis within the caudate lobe of liver; due to history of thoracic adenopathy and thymic infiltration, follow-up non emergent MR imaging of the liver with and without contrast recommended to exclude hepatic mass 3) 11/24/2019: PET CT scan performed showing hypermetabolic mediastinal adenopathy, consistent with lymphoma. (Deauville 5). No extrathoracic disease 4) 11/30/2019: EBUS performed, failed to provide adequate tissue for diagnosis 5) 12/08/2019: mediastinoscopy performed, biopsy confirmed Hodgkin's lymphoma.  6) 12/28/2019 Cycle 1 Day 1 of ABVD Chemotherapy  7) 01/10/2020: Cycle 1 Day 15 of ABVD Chemotherapy  8) 01/23/2020: Cycle 2 Day 1 of ABVD Chemotherapy  9) 02/07/2020: Cycle 2 Day 15 of ABVD Chemotherapy  10) 02/19/2020: PET CT scan showed decrease in size and degree of FDG uptake associated with previously noted FDG avid mediastinal adenopathy with SUV max of 5.19. This is compatible with Deauville criteria 4. 11) 02/23/2020: Cycle 3 Day 1 of ABVD Chemotherapy  Interval History:  Martin Spencer 29 y.o. male with medical history significant for classical Hodgkin Lymphoma who presents for a follow up visit. The patient was last seen on 02/07/2020 to start Cycle 2 Day 15 of treatment. He  presents today prior to Cycle 3 Day 1 of chemotherapy. In the interim he had a visit due to concern for rash and a PET CT scan which showed decrease size of lymph node, but Deauville score of 4.    On exam today Martin Spencer is accompanied by his mother and father.  He notes he has been well in the interim since his last visit, other than some marked anxiety due to the results of the PET CT scan.  He reports that the rash that he previously developed has subsequently resolved with the steroid cream.  He notes he is had no issues with cough, shortness of breath, fevers, chills, sweats, nausea, running or diarrhea.  Overall he is willing and able to proceed with chemotherapy at this time.  A full 10 point ROS is listed below.  The bulk of our discussion focused on the results of the PET CT scan and the steps moving forward.  MEDICAL HISTORY:  Past Medical History:  Diagnosis Date  . ADD (attention deficit disorder)   . Cancer ([place])   . Childhood asthma   . Pericarditis   . Shortness of breath    per patient, since recent diagnoses of pericarditis, sometimes get SOB on rest   SURGICAL HISTORY: Past Surgical History:  Procedure Laterality Date  . APPENDECTOMY    . FINE NEEDLE ASPIRATION  11/30/2019   Procedure: FINE NEEDLE ASPIRATION (FNA) LINEAR;  Surgeon: Candee Furbish, MD;  Location: [facility] ENDOSCOPY;  Service: Pulmonary;;  . IR IMAGING GUIDED PORT INSERTION  12/26/2019  . LAPAROSCOPIC APPENDECTOMY N/A 11/14/2017   Procedure: APPENDECTOMY LAPAROSCOPIC;  Surgeon: Georganna Skeans, MD;  Location: [facility];  Service: General;  Laterality: N/A;  . MEDIASTINOSCOPY N/A 12/08/2019   Procedure: MEDIASTINOSCOPY;  Surgeon: Modesto Charon  C, MD;  Location: [facility];  Service: Thoracic;  Laterality: N/A;  . VIDEO BRONCHOSCOPY WITH ENDOBRONCHIAL ULTRASOUND N/A 11/30/2019   Procedure: VIDEO BRONCHOSCOPY WITH ENDOBRONCHIAL ULTRASOUND;  Surgeon: Candee Furbish, MD;  Location[facility] ENDOSCOPY;  Service: Pulmonary;   Laterality: N/A;  . WISDOM TOOTH EXTRACTION      SOCIAL HISTORY: Social History   Socioeconomic History  . Marital status: Single    Spouse name: Not on file  . Number of children: Not on file  . Years of education: Not on file  . Highest education level: Not on file  Occupational History  . Not on file  Tobacco Use  . Smoking status: Former Smoker    Types: Cigarettes  . Smokeless tobacco: Never Used  . Tobacco comment: quit in high school  Vaping Use  . Vaping Use: Never used  Substance and Sexual Activity  . Alcohol use: Yes    Comment: per patient "rarely"  . Drug use: Not Currently    Types: Marijuana    Comment: CBD last use 2 weeks ago ( 11/29/19)  . Sexual activity: Not on file  Other Topics Concern  . Not on file  Social History Narrative  . Not on file   Social Determinants of Health   Financial Resource Strain:   . Difficulty of Paying Living Expenses: Not on file  Food Insecurity:   . Worried About Charity fundraiser in the Last Year: Not on file  . Ran Out of Food in the Last Year: Not on file  Transportation Needs:   . Lack of Transportation (Medical): Not on file  . Lack of Transportation (Non-Medical): Not on file  Physical Activity:   . Days of Exercise per Week: Not on file  . Minutes of Exercise per Session: Not on file  Stress:   . Feeling of Stress : Not on file  Social Connections:   . Frequency of Communication with Friends and Family: Not on file  . Frequency of Social Gatherings with Friends and Family: Not on file  . Attends Religious Services: Not on file  . Active Member of Clubs or Organizations: Not on file  . Attends Archivist Meetings: Not on file  . Marital Status: Not on file  Intimate Partner Violence:   . Fear of Current or Ex-Partner: Not on file  . Emotionally Abused: Not on file  . Physically Abused: Not on file  . Sexually Abused: Not on file    FAMILY HISTORY: Family History  Problem Relation Age of  Onset  . Kidney cancer Father   . Colon cancer Paternal Grandfather     ALLERGIES:  is allergic to other.  MEDICATIONS:  Current Outpatient Medications  Medication Sig Dispense Refill  . acetaminophen (TYLENOL) 500 MG tablet Take 500 mg by mouth every 6 (six) hours as needed for mild pain or headache.    . citalopram (CELEXA) 10 MG tablet Take 1 tablet (10 mg total) by mouth daily. 30 tablet 2  . lidocaine-prilocaine (EMLA) cream Apply 1 application topically as needed. (Patient taking differently: Apply 1 application topically as needed (port). ) 30 g 1  . ondansetron (ZOFRAN) 8 MG tablet Take 1 tablet (8 mg total) by mouth every 8 (eight) hours as needed for nausea or vomiting. 20 tablet 1  . prochlorperazine (COMPAZINE) 10 MG tablet Take 1 tablet (10 mg total) by mouth every 6 (six) hours as needed for nausea or vomiting. 30 tablet 1  . traMADol (ULTRAM)  50 MG tablet Take 1 tablet (50 mg total) by mouth every 6 (six) hours as needed for moderate pain. 10 tablet 0  . triamcinolone lotion (KENALOG) 0.1 % Apply 1 application topically 3 (three) times daily. 120 mL 1   No current facility-administered medications for this visit.   Facility-Administered Medications Ordered in Other Visits  Medication Dose Route Frequency Provider Last Rate Last Admin  . dacarbazine (DTIC) 760 mg in sodium chloride 0.9 % 250 mL chemo infusion  375 mg/m2 (Treatment Plan Recorded) Intravenous Once Ledell Peoples IV, MD      . heparin lock flush 100 unit/mL  500 Units Intracatheter Once PRN Ledell Peoples IV, MD      . sodium chloride flush (NS) 0.9 % injection 10 mL  10 mL Intracatheter PRN Orson Slick, MD        REVIEW OF SYSTEMS:   Constitutional: ( - ) fevers, ( - )  chills , ( - ) night sweats Eyes: ( - ) blurriness of vision, ( - ) double vision, ( - ) watery eyes Ears, nose, mouth, throat, and face: ( - ) mucositis, ( - ) sore throat Respiratory: ( - ) cough, ( - ) dyspnea, ( - )  wheezes Cardiovascular: ( - ) palpitation, ( - ) chest discomfort, ( - ) lower extremity swelling Gastrointestinal:  ( - ) nausea, ( - ) heartburn, ( - ) change in bowel habits Skin: ( - ) abnormal skin rashes Lymphatics: ( - ) new lymphadenopathy, ( - ) easy bruising Neurological: ( - ) numbness, ( - ) tingling, ( - ) new weaknesses Behavioral/Psych: ( - ) mood change, ( - ) new changes  All other systems were reviewed with the patient and are negative.  PHYSICAL EXAMINATION: ECOG PERFORMANCE STATUS: 0 - Asymptomatic  Vitals:   02/23/20 1155  BP: 131/74  Pulse: 74  Resp: 20  Temp: (!) 96.9 F (36.1 C)  SpO2: 98%   Filed Weights   02/23/20 1155  Weight: 178 lb 6.4 oz (80.9 kg)    GENERAL: well appearing fit young Caucasian male. alert, no distress and comfortable SKIN: skin color, texture, turgor are normal, no rashes or significant lesions. Small surgical incision at base of neck, well healed.  EYES: conjunctiva are pink and non-injected, sclera clear LUNGS: clear to auscultation and percussion with normal breathing effort HEART: regular rate & rhythm and no murmurs and no lower extremity edema Musculoskeletal: no cyanosis of digits and no clubbing  PSYCH: alert & oriented x 3, fluent speech NEURO: no focal motor/sensory deficits  LABORATORY DATA:  I have reviewed the data as listed CBC Latest Ref Rng & Units 02/23/2020 02/07/2020 01/30/2020  WBC 4.0 - 10.5 K/uL 2.8(L) 17.4(H) 15.8(H)  Hemoglobin 13.0 - 17.0 g/dL 13.3 13.2 13.1  Hematocrit 39 - 52 % 38.3(L) 38.0(L) 37.0(L)  Platelets 150 - 400 K/uL 302 190 228    CMP Latest Ref Rng & Units 02/23/2020 02/07/2020 01/30/2020  Glucose 70 - 99 mg/dL 89 121(H) 113(H)  BUN 6 - 20 mg/dL 7 14 13   Creatinine 0.61 - 1.24 mg/dL 0.85 0.99 0.77  Sodium 135 - 145 mmol/L 138 138 139  Potassium 3.5 - 5.1 mmol/L 3.8 3.8 3.8  Chloride 98 - 111 mmol/L 106 105 103  CO2 22 - 32 mmol/L 26 28 28   Calcium 8.9 - 10.3 mg/dL 9.6 9.7 9.7   Total Protein 6.5 - 8.1 g/dL 7.1 7.2 7.0  Total Bilirubin 0.3 -  1.2 mg/dL 1.5(H) 0.8 1.0  Alkaline Phos 38 - 126 U/L 55 102 131(H)  AST 15 - 41 U/L 24 23 17   ALT 0 - 44 U/L 34 54(H) 33    RADIOGRAPHIC STUDIES: I have personally review the images and agree with the findings below: marked FDG avid mediastinal lymph nodes. Mild uptake under left arm (prior COVID vaccine). No disease noted elsewhere  DG Chest 2 View  Result Date: 02/06/2020 CLINICAL DATA:  29 y.o. outpatient w/ hodgkin lymphoma (11/2019). Per pt tightness in throat / upper chest upon inspiration. EXAM: CHEST - 2 VIEW COMPARISON:  Chest radiograph 01/16/2020 FINDINGS: Right chest port is stable in appearance. Stable cardiomediastinal contours. The lungs are clear. No pneumothorax or pleural effusion. No acute finding in the visualized skeleton. IMPRESSION: No acute cardiopulmonary process. Electronically Signed   By: Audie Pinto M.D.   On: 02/06/2020 15:41   NM PET Image Restag (PS) Skull Base To Thigh  Result Date: 02/19/2020 CLINICAL DATA:  Subsequent treatment strategy for lymphoma. EXAM: NUCLEAR MEDICINE PET SKULL BASE TO THIGH TECHNIQUE: 9.1 mCi F-18 FDG was injected intravenously. Full-ring PET imaging was performed from the skull base to thigh after the radiotracer. CT data was obtained and used for attenuation correction and anatomic localization. Fasting blood glucose: 88 mg/dl COMPARISON:  PET-CT 11/24/2019 FINDINGS: Mediastinal blood pool activity: SUV max 2.16 Liver activity: SUV max 3.52 NECK: No hypermetabolic lymph nodes in the neck. Incidental CT findings: none CHEST: Significant interval decrease in size and degree of FDG uptake associated with previously noted FDG avid mediastinal tumor. On today's exam, within the anterior mediastinum there is focal area of FDG avid soft tissue measuring 2.2 x 1.5 cm within SUV max of 5.19, Deauville criteria 4. On the previous exam this measured 6.1 x 3.0 cm within SUV max of  25.3. No FDG avid supraclavicular, axillary, or hilar lymph nodes. No pleural effusion. No suspicious pulmonary nodule or mass. Incidental CT findings: none ABDOMEN/PELVIS: No abnormal FDG uptake within the liver, spleen, pancreas, or adrenal glands. No FDG avid abdominopelvic adenopathy. Incidental CT findings: none SKELETON: No focal hypermetabolic activity to suggest skeletal metastasis. Incidental CT findings: Diffusely increased tracer uptake throughout the bone marrow likely reflects post treatment changes. IMPRESSION: 1. Interval decrease in size and degree of FDG uptake associated with previously noted FDG avid mediastinal adenopathy. Residual FDG avid soft tissue within the anterior mediastinum is noted on today's study within SUV max of 5.19. This is compatible with Deauville criteria 4. Electronically Signed   By: Kerby Moors M.D.   On: 02/19/2020 15:47    ASSESSMENT & PLAN Martin Spencer 29 y.o. male with medical history significant for classical Hodgkin Lymphoma stage I who presents for a follow up visit. Today is Cycle 3 Day 1 of chemotherapy.   On exam today Martin Spencer is physically well and his labs are stable, though he does have a great deal of anxiety about the results of his PET CT scan.  We discussed the benefits of counseling versus psychiatric help, however he does express desire for a fast acting antianxiolytic such as benzodiazepine, however I do not think that this is an appropriate situation for these medications.  I would recommend that he establish with professional mental health specialist to help him deal with the stressful time.  He is mother does note that the citalopram medication has been working well, but that he does still have a lot of anxiety.  He is clear for treatment  with chemotherapy today.  Previously we discussed the patient has a stage I classical Hodgkin's lymphoma, favorable disease.  He is young and otherwise fit and therefore would be able to tolerate  full ABVD chemotherapy.  We discussed expected side effects including nausea, vomiting, constipation, nerve damage, fertility loss, hair loss, and possible secondary malignancy.  The patient and his parents voiced their understanding of the reasoning behind obtaining PFTs, chemotherapy education, port placement, and fertility preservation at [facility].  They have the opportunity to ask all questions and concerns regarding treatment moving forward.  The regimen of choice for this stage I classical Hodgkin's lymphoma, favorable disease would consist of ABVD chemotherapy.  ABVD is comprised of doxorubicin 25 mg/m IV, bleomycin 10 units/m IV, vinblastine 6 g/m IV, and dacarbazine 375 mg/m IV all to be administered on days 1 and 15 of a 28-day cycle.  The patient completed 2 cycles of this and unfortunately due to a Deauville Score of 4 we would recommend an additional 2 cycles of ABVD. After 2 more cycles the patient is to undergo a PET CT scan at which time treatment moving forward can be dictated by response to therapy.  # Classical Hodgkin Lymphoma, Stage I.  Favorable Disease.  --Cycle 3 Day 1 ABVD chemotherapy today --patient has completed PFTs, Port, and fertility preservation. TTE performed on 11/18/2019 (EF 65%)  --discussed the risks and benefits of treatment. Additionally noted that we will re-evaluate again with a PET CT scan after 4 doses (2 cycles) of chemotherapy. Last PET CT showed decrease in size and intensity of mediastinal lymph nodes, though he maintains a Deauville Score 4.   --Will have patient return to clinic q 2 weeks with continued chemotherapy   #Neutropenia, mild -ANC >1.1, ok to proceed with treatment. GCSF can be considered for severe neutropenia but is often not required.  --will proceed with chemotherapy today after we discussed the risks and benefits. He was agreeable to proceeding with treatment --HOLD GCSF support for the current  cycle  #Thrombocytopenia, resolved --Plt rebounded to normal levels  --continue to monitor  # Symptom Management -- prescribed zofran 8mg  PO q8H PRN and compazine 10mg  q6H PRN.  --EMLA cream for port -- assure daily BM while on therapy with vincristine. Assure patient has senna docusate and miralax on hand during treatment.  --encourage claratin/ibuprofen with GCSF shot. GCSF held since Cycle 2 Day 15 --strict return precautions for shortness of breath, cough, fevers.   No orders of the defined types were placed in this encounter.  All questions were answered. The patient knows to call the clinic with any problems, questions or concerns.  A total of more than 30 minutes were spent on this encounter and over half of that time was spent on counseling and coordination of care as outlined above.   Ledell Peoples, MD Department of Hematology/Oncology [place] at [facility] Phone: [phone] Pager: [phone] Email: [email or web address]  02/23/2020 3:49 PM   Literature Support:  Sharion Dove, et al. The use of filgrastim in patients with Hodgkin lymphoma receiving ABVD. Int J Hematol Oncol Stem Cell Res. 2017;11(4):286-292.  --This study does not find evidence that the combination of bleomycin and G-CSF increases the risk for bleomycin- induced pulmonary toxicity. We recommend G-CSF use in HL patients receiving bleomycin when needed to maintain dose intensity.

## 2020-02-29 ENCOUNTER — Encounter: Payer: Self-pay | Admitting: General Practice

## 2020-02-29 NOTE — Progress Notes (Signed)
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   SDOH (Social Determinants of Health) assessments performed: Yes   Distress Screen completed: No   Family/Social Information:  . Housing Arrangement: patient lives with mother, she moved back to [place] in order to help son . Family members/support persons in your life? Mother is very supportive . Transportation concerns: None. . Employment: Out on work excuse. Income source:savings, parent . Financial concerns: Yes, due to illness and/or loss of work during treatment o Type of concern: currently is doing well and living on savings w support of parent . Food access concerns: None . Religious or spiritual practice: did not assess . Medication Concerns: None . Services Currently in place: none  Coping/ Adjustment to diagnosis: . Patient understands treatment plan and what happens next? "Its been a sobering experience - not what I expected at age 7.  Cant do the things I used to do, cant work at the level I was working."  Therapist, occupational.  "If I had a job that was a little simpler, it would be a lot easier."  Has ground it down to a halt.   . Concerns about diagnosis and/or treatment: Afraid of cancer and "I dont see a way out of this cancer, a way forward, life after cancer.  "It was supposed to be gone on the last scan, but its still there."  "Its put me in a paralyzed state, I don't want to think about the future." . Patient reported stressors: Anxiety, Adjusting to my illness, Feeling hopeless and Facing my mortality . Hopes and priorities: complete cure . Patient enjoys time with family/ friends and outside, working on applications to go to graduate school for [employer]  Used to like to go to gym/office, eat indoors in restaurants . Current coping skills/ strengths: Ability for insight, Average or above  average intelligence, Capable of independent living, Communication skills and Supportive family/friends    SUMMARY: Current SDOH Barriers:  . none  Clinical Social Work Clinical Goal(s):  Marland Kitchen CSW and patient will work on developing realistic and positive approach to challenge of cancer diagnosis and treatment  Interventions: . Discussed common feeling and emotions when being diagnosed with cancer, and the importance of support during treatment . Informed patient of the support team roles and support services at [facility] . Provided CSW contact information and encouraged patient to call with any questions or concerns . Patient interviewed and appropriate assessments performed . Provided mental health counseling with regard to anxiety related to living with cancer (mental health diagnosis or concern)   Follow Up Plan: Appointment scheduled for SW to meet with client in provider office on:  03/01/2020 Patient verbalizes understanding of plan: Yes   Edwyna Shell, [place] Worker Phone:  [place] , LCSW

## 2020-03-01 ENCOUNTER — Encounter: Payer: Self-pay | Admitting: General Practice

## 2020-03-01 ENCOUNTER — Other Ambulatory Visit: Payer: Self-pay

## 2020-03-01 ENCOUNTER — Inpatient Hospital Stay: Payer: BC Managed Care – PPO | Attending: Hematology and Oncology | Admitting: General Practice

## 2020-03-01 DIAGNOSIS — D709 Neutropenia, unspecified: Secondary | ICD-10-CM | POA: Insufficient documentation

## 2020-03-01 DIAGNOSIS — F419 Anxiety disorder, unspecified: Secondary | ICD-10-CM | POA: Insufficient documentation

## 2020-03-01 DIAGNOSIS — C8112 Nodular sclerosis classical Hodgkin lymphoma, intrathoracic lymph nodes: Secondary | ICD-10-CM | POA: Insufficient documentation

## 2020-03-01 DIAGNOSIS — Z5111 Encounter for antineoplastic chemotherapy: Secondary | ICD-10-CM | POA: Insufficient documentation

## 2020-03-01 NOTE — Progress Notes (Signed)
[place] CSW Progress Notes  Met w patient in office.  Explored dynamics of being diagnosed w cancer as young adult and impact on work/lifestyle.  Protective/supportive factors include parents, girlfriend, lack of financial stress and good access to practical supports and availability of mentors who have successfully coped with cancer.  Explored understanding of cancer and its impact on future orientation.  Worked on options for coping w anxiety and ways to change unhelpful thoughts about his situation into ones that might be more productive.  Provided resources for young adults diagnosed w cancer, encouraged connection with others.  Will see him again in one week.  Edwyna Shell, LCSW Clinical Social Worker Phone:  [phone] [place]:  [phone]

## 2020-03-07 ENCOUNTER — Encounter: Payer: Self-pay | Admitting: Hematology and Oncology

## 2020-03-07 ENCOUNTER — Inpatient Hospital Stay: Payer: BC Managed Care – PPO

## 2020-03-07 ENCOUNTER — Other Ambulatory Visit: Payer: Self-pay

## 2020-03-07 ENCOUNTER — Inpatient Hospital Stay: Payer: BC Managed Care – PPO | Admitting: Hematology and Oncology

## 2020-03-07 VITALS — BP 121/69 | HR 76 | Temp 97.5°F | Resp 20 | Ht 71.0 in | Wt 180.8 lb

## 2020-03-07 DIAGNOSIS — D709 Neutropenia, unspecified: Secondary | ICD-10-CM | POA: Diagnosis not present

## 2020-03-07 DIAGNOSIS — C8112 Nodular sclerosis classical Hodgkin lymphoma, intrathoracic lymph nodes: Secondary | ICD-10-CM

## 2020-03-07 DIAGNOSIS — F419 Anxiety disorder, unspecified: Secondary | ICD-10-CM | POA: Diagnosis not present

## 2020-03-07 DIAGNOSIS — R590 Localized enlarged lymph nodes: Secondary | ICD-10-CM

## 2020-03-07 DIAGNOSIS — Z95828 Presence of other vascular implants and grafts: Secondary | ICD-10-CM

## 2020-03-07 DIAGNOSIS — Z5111 Encounter for antineoplastic chemotherapy: Secondary | ICD-10-CM | POA: Diagnosis not present

## 2020-03-07 DIAGNOSIS — R519 Headache, unspecified: Secondary | ICD-10-CM

## 2020-03-07 LAB — CBC WITH DIFFERENTIAL (CANCER CENTER ONLY)
Abs Immature Granulocytes: 0.01 10*3/uL (ref 0.00–0.07)
Basophils Absolute: 0 10*3/uL (ref 0.0–0.1)
Basophils Relative: 1 %
Eosinophils Absolute: 0 10*3/uL (ref 0.0–0.5)
Eosinophils Relative: 0 %
HCT: 37.7 % — ABNORMAL LOW (ref 39.0–52.0)
Hemoglobin: 13.2 g/dL (ref 13.0–17.0)
Immature Granulocytes: 0 %
Lymphocytes Relative: 23 %
Lymphs Abs: 0.8 10*3/uL (ref 0.7–4.0)
MCH: 30.4 pg (ref 26.0–34.0)
MCHC: 35 g/dL (ref 30.0–36.0)
MCV: 86.9 fL (ref 80.0–100.0)
Monocytes Absolute: 0.2 10*3/uL (ref 0.1–1.0)
Monocytes Relative: 6 %
Neutro Abs: 2.4 10*3/uL (ref 1.7–7.7)
Neutrophils Relative %: 70 %
Platelet Count: 207 10*3/uL (ref 150–400)
RBC: 4.34 MIL/uL (ref 4.22–5.81)
RDW: 14.7 % (ref 11.5–15.5)
WBC Count: 3.4 10*3/uL — ABNORMAL LOW (ref 4.0–10.5)
nRBC: 0 % (ref 0.0–0.2)

## 2020-03-07 LAB — CMP (CANCER CENTER ONLY)
ALT: 24 U/L (ref 0–44)
AST: 23 U/L (ref 15–41)
Albumin: 4.4 g/dL (ref 3.5–5.0)
Alkaline Phosphatase: 50 U/L (ref 38–126)
Anion gap: 6 (ref 5–15)
BUN: 9 mg/dL (ref 6–20)
CO2: 27 mmol/L (ref 22–32)
Calcium: 9.3 mg/dL (ref 8.9–10.3)
Chloride: 106 mmol/L (ref 98–111)
Creatinine: 0.82 mg/dL (ref 0.61–1.24)
GFR, Estimated: >60 mL/min (ref >60)
Glucose, Bld: 96 mg/dL (ref 70–99)
Potassium: 3.7 mmol/L (ref 3.5–5.1)
Sodium: 139 mmol/L (ref 135–145)
Total Bilirubin: 1.8 mg/dL — ABNORMAL HIGH (ref 0.3–1.2)
Total Protein: 7.2 g/dL (ref 6.5–8.1)

## 2020-03-07 LAB — LACTATE DEHYDROGENASE: LDH: 161 U/L (ref 98–192)

## 2020-03-07 MED ORDER — HEPARIN SOD (PORK) LOCK FLUSH 100 UNIT/ML IV SOLN
500.0000 [IU] | Freq: Once | INTRAVENOUS | Status: AC | PRN
  Administered 2020-03-07: 500 [IU]
  Filled 2020-03-07: qty 5

## 2020-03-07 MED ORDER — SODIUM CHLORIDE 0.9 % IV SOLN
10.0000 [IU]/m2 | Freq: Once | INTRAVENOUS | Status: AC
  Administered 2020-03-07: 20 [IU] via INTRAVENOUS
  Filled 2020-03-07: qty 6.67

## 2020-03-07 MED ORDER — SODIUM CHLORIDE 0.9 % IV SOLN
150.0000 mg | Freq: Once | INTRAVENOUS | Status: AC
  Administered 2020-03-07: 150 mg via INTRAVENOUS
  Filled 2020-03-07: qty 150

## 2020-03-07 MED ORDER — VINBLASTINE SULFATE CHEMO INJECTION 1 MG/ML
5.9500 mg/m2 | Freq: Once | INTRAVENOUS | Status: AC
  Administered 2020-03-07: 12 mg via INTRAVENOUS
  Filled 2020-03-07: qty 12

## 2020-03-07 MED ORDER — DOXORUBICIN HCL CHEMO IV INJECTION 2 MG/ML
25.0000 mg/m2 | Freq: Once | INTRAVENOUS | Status: AC
  Administered 2020-03-07: 50 mg via INTRAVENOUS
  Filled 2020-03-07: qty 25

## 2020-03-07 MED ORDER — SODIUM CHLORIDE 0.9 % IV SOLN
375.0000 mg/m2 | Freq: Once | INTRAVENOUS | Status: AC
  Administered 2020-03-07: 760 mg via INTRAVENOUS
  Filled 2020-03-07: qty 76

## 2020-03-07 MED ORDER — SODIUM CHLORIDE 0.9% FLUSH
10.0000 mL | Freq: Once | INTRAVENOUS | Status: AC
  Administered 2020-03-07: 10 mL via INTRAVENOUS
  Filled 2020-03-07: qty 10

## 2020-03-07 MED ORDER — ACETAMINOPHEN 325 MG PO TABS
ORAL_TABLET | ORAL | Status: AC
  Filled 2020-03-07: qty 2

## 2020-03-07 MED ORDER — PALONOSETRON HCL INJECTION 0.25 MG/5ML
0.2500 mg | Freq: Once | INTRAVENOUS | Status: AC
  Administered 2020-03-07: 0.25 mg via INTRAVENOUS

## 2020-03-07 MED ORDER — PALONOSETRON HCL INJECTION 0.25 MG/5ML
INTRAVENOUS | Status: AC
  Filled 2020-03-07: qty 5

## 2020-03-07 MED ORDER — ACETAMINOPHEN 325 MG PO TABS
650.0000 mg | ORAL_TABLET | Freq: Once | ORAL | Status: AC
  Administered 2020-03-07: 650 mg via ORAL

## 2020-03-07 MED ORDER — SODIUM CHLORIDE 0.9 % IV SOLN
Freq: Once | INTRAVENOUS | Status: AC
  Filled 2020-03-07: qty 250

## 2020-03-07 MED ORDER — SODIUM CHLORIDE 0.9 % IV SOLN
10.0000 mg | Freq: Once | INTRAVENOUS | Status: AC
  Administered 2020-03-07: 10 mg via INTRAVENOUS
  Filled 2020-03-07: qty 10

## 2020-03-07 MED ORDER — SODIUM CHLORIDE 0.9% FLUSH
10.0000 mL | INTRAVENOUS | Status: DC | PRN
  Administered 2020-03-07: 10 mL
  Filled 2020-03-07: qty 10

## 2020-03-07 NOTE — Progress Notes (Signed)
OK to treat with T.Bili of 1.8 per Dr. Lorenso Courier

## 2020-03-07 NOTE — Patient Instructions (Signed)
[facility] Discharge Instructions for Patients Receiving Chemotherapy  Today you received the following chemotherapy agents Doxorubicin (ADRIAMYCIN), Vinblastine (VELBAN), Bleomycin (BLEOCIN) & Dacarbazine (DTIC).  To help prevent nausea and vomiting after your treatment, we encourage you to take your nausea medication as prescribed.   If you develop nausea and vomiting that is not controlled by your nausea medication, call the clinic.   BELOW ARE SYMPTOMS THAT SHOULD BE REPORTED IMMEDIATELY:  *FEVER GREATER THAN 100.5 F  *CHILLS WITH OR WITHOUT FEVER  NAUSEA AND VOMITING THAT IS NOT CONTROLLED WITH YOUR NAUSEA MEDICATION  *UNUSUAL SHORTNESS OF BREATH  *UNUSUAL BRUISING OR BLEEDING  TENDERNESS IN MOUTH AND THROAT WITH OR WITHOUT PRESENCE OF ULCERS  *URINARY PROBLEMS  *BOWEL PROBLEMS  UNUSUAL RASH Items with * indicate a potential emergency and should be followed up as soon as possible.  Feel free to call the clinic should you have any questions or concerns. The clinic phone number is [phone].  Please show the [facility] [place] at check-in to the Emergency Department and triage nurse.

## 2020-03-08 ENCOUNTER — Inpatient Hospital Stay: Payer: BC Managed Care – PPO | Admitting: General Practice

## 2020-03-08 ENCOUNTER — Other Ambulatory Visit: Payer: Self-pay

## 2020-03-08 DIAGNOSIS — C8112 Nodular sclerosis classical Hodgkin lymphoma, intrathoracic lymph nodes: Secondary | ICD-10-CM

## 2020-03-08 NOTE — Progress Notes (Signed)
[place] CSW Progress Notes  Met w patient in office.  Reviewed progress - he is taking steps to regain control of multiple areas of his life. He has moved back into his own apartment, realizing that too much time on his hands was counterproductive.  He is working some, but feels frustrated by chemo brain and fatigue.  Discussed "things we can change" vs "things we cannot change."  Discussed feelings of guilt related to inability to work at previous high energy level and uncertainty when he will be able to do so.  Identified healthy areas to focus his energy - exercise, building supportive relationships, taking care of his own needs, getting outside/doing one positive activity/day.  Discussed need to focus in these areas vs areas where he cannot control outcomes.  Will see again next week.  Edwyna Shell, LCSW Clinical Social Worker Phone:  [phone]

## 2020-03-11 ENCOUNTER — Encounter: Payer: Self-pay | Admitting: Hematology and Oncology

## 2020-03-11 MED ORDER — CITALOPRAM HYDROBROMIDE 20 MG PO TABS: 20.0000 mg | ORAL_TABLET | Freq: Every day | ORAL | 2 refills | Status: DC

## 2020-03-11 NOTE — Progress Notes (Signed)
[place] [phone]   [phone]  PROGRESS NOTE  Patient Care Team: Patient, No Pcp Per as PCP - General (General Practice) Martin Munroe, MD as PCP - Cardiology (Cardiology)  Hematological/Oncological History # Classical Hodgkin Lymphoma, Stage I.  Favorable Disease 1) 11/17/2019: presented to the emergency department with chest pain. CXR performed showed right paratracheal density. CT recommended. CT angiogram showed diffuse enlargement of the thymus gland with associated mild mediastinal adenopathy including right paratracheal enlarged lymph node corresponding to radiographic abnormality 2) 11/19/2019: CT abdomen performed showing subtle mass versus steatosis within the caudate lobe of liver; due to history of thoracic adenopathy and thymic infiltration, follow-up non emergent MR imaging of the liver with and without contrast recommended to exclude hepatic mass 3) 11/24/2019: PET CT scan performed showing hypermetabolic mediastinal adenopathy, consistent with lymphoma. (Deauville 5). No extrathoracic disease 4) 11/30/2019: EBUS performed, failed to provide adequate tissue for diagnosis 5) 12/08/2019: mediastinoscopy performed, biopsy confirmed Hodgkin's lymphoma.  6) 12/28/2019 Cycle 1 Day 1 of ABVD Chemotherapy  7) 01/10/2020: Cycle 1 Day 15 of ABVD Chemotherapy  8) 01/23/2020: Cycle 2 Day 1 of ABVD Chemotherapy  9) 02/07/2020: Cycle 2 Day 15 of ABVD Chemotherapy  10) 02/19/2020: PET CT scan showed decrease in size and degree of FDG uptake associated with previously noted FDG avid mediastinal adenopathy with SUV max of 5.19. This is compatible with Deauville criteria 4. 11) 02/23/2020: Cycle 3 Day 1 of ABVD Chemotherapy 12) 03/07/2020: Cycle 3 Day 15 of ABVD Chemotherapy  Interval History:  Martin Spencer 29 y.o. male with medical history significant for classical Hodgkin Lymphoma who presents for a follow up visit. The patient was last seen on  02/23/2020 to start Cycle 3 Day 1 of treatment. He presents today prior to Cycle 3 Day 15 of chemotherapy. In the interim he has had no changes in his health.   On exam today Martin Spencer is accompanied by his mother.  He notes that he has been well in the interim and that he has been working out and his energy levels have been "okay".  He is also been working at his job.  He reports that he tried "an experiment" last week where he was smoking marijuana in order to help alleviate his anxiety.  He notes that it did not provide much relief.  He otherwise denies having any issues with fevers, chills, nausea, vomiting, or diarrhea.  He does note some occasional sweats at night.  Full 10 point ROS is listed below.  MEDICAL HISTORY:  Past Medical History:  Diagnosis Date  . Martin (attention deficit disorder)   . Cancer ([place])   . Childhood asthma   . Pericarditis   . Shortness of breath    per patient, since recent diagnoses of pericarditis, sometimes get SOB on rest   SURGICAL HISTORY: Past Surgical History:  Procedure Laterality Date  . APPENDECTOMY    . FINE NEEDLE ASPIRATION  11/30/2019   Procedure: FINE NEEDLE ASPIRATION (FNA) LINEAR;  Surgeon: Candee Furbish, MD;  Location[facility] ENDOSCOPY;  Service: Pulmonary;;  . IR IMAGING GUIDED PORT INSERTION  12/26/2019  . LAPAROSCOPIC APPENDECTOMY N/A 11/14/2017   Procedure: APPENDECTOMY LAPAROSCOPIC;  Surgeon: Georganna Skeans, MD;  Location: [facility];  Service: General;  Laterality: N/A;  . MEDIASTINOSCOPY N/A 12/08/2019   Procedure: MEDIASTINOSCOPY;  Surgeon: Melrose Nakayama, MD;  Location: [facility] OR;  Service: Thoracic;  Laterality: N/A;  . VIDEO BRONCHOSCOPY WITH ENDOBRONCHIAL ULTRASOUND N/A 11/30/2019  Procedure: VIDEO BRONCHOSCOPY WITH ENDOBRONCHIAL ULTRASOUND;  Surgeon: Candee Furbish, MD;  Location: [facility] ENDOSCOPY;  Service: Pulmonary;  Laterality: N/A;  . WISDOM TOOTH EXTRACTION      SOCIAL HISTORY: Social History   Socioeconomic History  .  Marital status: Single    Spouse name: Not on file  . Number of children: Not on file  . Years of education: Not on file  . Highest education level: Not on file  Occupational History  . Not on file  Tobacco Use  . Smoking status: Former Smoker    Types: Cigarettes  . Smokeless tobacco: Never Used  . Tobacco comment: quit in high school  Vaping Use  . Vaping Use: Never used  Substance and Sexual Activity  . Alcohol use: Yes    Comment: per patient "rarely"  . Drug use: Not Currently    Types: Marijuana    Comment: CBD last use 2 weeks ago ( 11/29/19)  . Sexual activity: Not on file  Other Topics Concern  . Not on file  Social History Narrative  . Not on file   Social Determinants of Health   Financial Resource Strain: Low Risk   . Difficulty of Paying Living Expenses: Not hard at all  Food Insecurity: No Food Insecurity  . Worried About Charity fundraiser in the Last Year: Never true  . Ran Out of Food in the Last Year: Never true  Transportation Needs: No Transportation Needs  . Lack of Transportation (Medical): No  . Lack of Transportation (Non-Medical): No  Physical Activity:   . Days of Exercise per Week: Not on file  . Minutes of Exercise per Session: Not on file  Stress: Stress Concern Present  . Feeling of Stress : Rather much  Social Connections: Unknown  . Frequency of Communication with Friends and Family: More than three times a week  . Frequency of Social Gatherings with Friends and Family: More than three times a week  . Attends Religious Services: Not on file  . Active Member of Clubs or Organizations: Not on file  . Attends Archivist Meetings: Not on file  . Marital Status: Never married  Intimate Partner Violence:   . Fear of Current or Ex-Partner: Not on file  . Emotionally Abused: Not on file  . Physically Abused: Not on file  . Sexually Abused: Not on file    FAMILY HISTORY: Family History  Problem Relation Age of Onset  . Kidney  cancer Father   . Colon cancer Paternal Grandfather     ALLERGIES:  is allergic to other.  MEDICATIONS:  Current Outpatient Medications  Medication Sig Dispense Refill  . acetaminophen (TYLENOL) 500 MG tablet Take 500 mg by mouth every 6 (six) hours as needed for mild pain or headache.    . citalopram (CELEXA) 20 MG tablet Take 1 tablet (20 mg total) by mouth daily. 30 tablet 2  . lidocaine-prilocaine (EMLA) cream Apply 1 application topically as needed. (Patient taking differently: Apply 1 application topically as needed (port). ) 30 g 1  . ondansetron (ZOFRAN) 8 MG tablet Take 1 tablet (8 mg total) by mouth every 8 (eight) hours as needed for nausea or vomiting. 20 tablet 1  . prochlorperazine (COMPAZINE) 10 MG tablet Take 1 tablet (10 mg total) by mouth every 6 (six) hours as needed for nausea or vomiting. 30 tablet 1  . traMADol (ULTRAM) 50 MG tablet Take 1 tablet (50 mg total) by mouth every 6 (six) hours as  needed for moderate pain. 10 tablet 0  . triamcinolone lotion (KENALOG) 0.1 % Apply 1 application topically 3 (three) times daily. 120 mL 1   No current facility-administered medications for this visit.    REVIEW OF SYSTEMS:   Constitutional: ( - ) fevers, ( - )  chills , ( - ) night sweats Eyes: ( - ) blurriness of vision, ( - ) double vision, ( - ) watery eyes Ears, nose, mouth, throat, and face: ( - ) mucositis, ( - ) sore throat Respiratory: ( - ) cough, ( - ) dyspnea, ( - ) wheezes Cardiovascular: ( - ) palpitation, ( - ) chest discomfort, ( - ) lower extremity swelling Gastrointestinal:  ( - ) nausea, ( - ) heartburn, ( - ) change in bowel habits Skin: ( - ) abnormal skin rashes Lymphatics: ( - ) new lymphadenopathy, ( - ) easy bruising Neurological: ( - ) numbness, ( - ) tingling, ( - ) new weaknesses Behavioral/Psych: ( - ) mood change, ( - ) new changes  All other systems were reviewed with the patient and are negative.  PHYSICAL EXAMINATION: ECOG PERFORMANCE  STATUS: 0 - Asymptomatic  Vitals:   03/07/20 1057  BP: 121/69  Pulse: 76  Resp: 20  Temp: (!) 97.5 F (36.4 C)  SpO2: 100%   Filed Weights   03/07/20 1057  Weight: 180 lb 12.8 oz (82 kg)    GENERAL: well appearing fit young Caucasian male. alert, no distress and comfortable SKIN: skin color, texture, turgor are normal, no rashes or significant lesions. Small surgical incision at base of neck, well healed.  EYES: conjunctiva are pink and non-injected, sclera clear LUNGS: clear to auscultation and percussion with normal breathing effort HEART: regular rate & rhythm and no murmurs and no lower extremity edema Musculoskeletal: no cyanosis of digits and no clubbing  PSYCH: alert & oriented x 3, fluent speech NEURO: no focal motor/sensory deficits  LABORATORY DATA:  I have reviewed the data as listed CBC Latest Ref Rng & Units 03/07/2020 02/23/2020 02/07/2020  WBC 4.0 - 10.5 K/uL 3.4(L) 2.8(L) 17.4(H)  Hemoglobin 13.0 - 17.0 g/dL 13.2 13.3 13.2  Hematocrit 39 - 52 % 37.7(L) 38.3(L) 38.0(L)  Platelets 150 - 400 K/uL 207 302 190    CMP Latest Ref Rng & Units 03/07/2020 02/23/2020 02/07/2020  Glucose 70 - 99 mg/dL 96 89 121(H)  BUN 6 - 20 mg/dL 9 7 14   Creatinine 0.61 - 1.24 mg/dL 0.82 0.85 0.99  Sodium 135 - 145 mmol/L 139 138 138  Potassium 3.5 - 5.1 mmol/L 3.7 3.8 3.8  Chloride 98 - 111 mmol/L 106 106 105  CO2 22 - 32 mmol/L 27 26 28   Calcium 8.9 - 10.3 mg/dL 9.3 9.6 9.7  Total Protein 6.5 - 8.1 g/dL 7.2 7.1 7.2  Total Bilirubin 0.3 - 1.2 mg/dL 1.8(H) 1.5(H) 0.8  Alkaline Phos 38 - 126 U/L 50 55 102  AST 15 - 41 U/L 23 24 23   ALT 0 - 44 U/L 24 34 54(H)    RADIOGRAPHIC STUDIES: I have personally review the images and agree with the findings below: marked FDG avid mediastinal lymph nodes. Mild uptake under left arm (prior COVID vaccine). No disease noted elsewhere  NM PET Image Restag (PS) Skull Base To Thigh  Result Date: 02/19/2020 CLINICAL DATA:  Subsequent  treatment strategy for lymphoma. EXAM: NUCLEAR MEDICINE PET SKULL BASE TO THIGH TECHNIQUE: 9.1 mCi F-18 FDG was injected intravenously. Full-ring PET imaging was performed from  the skull base to thigh after the radiotracer. CT data was obtained and used for attenuation correction and anatomic localization. Fasting blood glucose: 88 mg/dl COMPARISON:  PET-CT 11/24/2019 FINDINGS: Mediastinal blood pool activity: SUV max 2.16 Liver activity: SUV max 3.52 NECK: No hypermetabolic lymph nodes in the neck. Incidental CT findings: none CHEST: Significant interval decrease in size and degree of FDG uptake associated with previously noted FDG avid mediastinal tumor. On today's exam, within the anterior mediastinum there is focal area of FDG avid soft tissue measuring 2.2 x 1.5 cm within SUV max of 5.19, Deauville criteria 4. On the previous exam this measured 6.1 x 3.0 cm within SUV max of 25.3. No FDG avid supraclavicular, axillary, or hilar lymph nodes. No pleural effusion. No suspicious pulmonary nodule or mass. Incidental CT findings: none ABDOMEN/PELVIS: No abnormal FDG uptake within the liver, spleen, pancreas, or adrenal glands. No FDG avid abdominopelvic adenopathy. Incidental CT findings: none SKELETON: No focal hypermetabolic activity to suggest skeletal metastasis. Incidental CT findings: Diffusely increased tracer uptake throughout the bone marrow likely reflects post treatment changes. IMPRESSION: 1. Interval decrease in size and degree of FDG uptake associated with previously noted FDG avid mediastinal adenopathy. Residual FDG avid soft tissue within the anterior mediastinum is noted on today's study within SUV max of 5.19. This is compatible with Deauville criteria 4. Electronically Signed   By: Kerby Moors M.D.   On: 02/19/2020 15:47    ASSESSMENT & PLAN Martin Spencer 29 y.o. male with medical history significant for classical Hodgkin Lymphoma stage I who presents for a follow up visit. Today is  Cycle 3 Day 15 of chemotherapy.   On exam today Martin Spencer is physically well and his labs are stable, though he does continue have a great deal of anxiety.  He tried smoking marijuana last week in order to help alleviate his symptoms, but he did not have much relief.  He is agreeable to increasing his dose of citalopram to 20 mg daily up from his current dose of 10 mg.  He is clear for treatment with chemotherapy today.  Previously we discussed the patient has a stage I classical Hodgkin's lymphoma, favorable disease.  He is young and otherwise fit and therefore would be able to tolerate full ABVD chemotherapy.  We discussed expected side effects including nausea, vomiting, constipation, nerve damage, fertility loss, hair loss, and possible secondary malignancy.  The patient and his parents voiced their understanding of the reasoning behind obtaining PFTs, chemotherapy education, port placement, and fertility preservation at [facility].  They have the opportunity to ask all questions and concerns regarding treatment moving forward.  The regimen of choice for this stage I classical Hodgkin's lymphoma, favorable disease would consist of ABVD chemotherapy.  ABVD is comprised of doxorubicin 25 mg/m IV, bleomycin 10 units/m IV, vinblastine 6 g/m IV, and dacarbazine 375 mg/m IV all to be administered on days 1 and 15 of a 28-day cycle.  The patient completed 2 cycles of this and unfortunately due to a Deauville Score of 4 we would recommend an additional 2 cycles of ABVD. After 2 more cycles the patient is to undergo a PET CT scan at which time treatment moving forward can be dictated by response to therapy.  # Classical Hodgkin Lymphoma, Stage I.  Favorable Disease.  --Cycle 3 Day 15 ABVD chemotherapy today --patient has completed PFTs, Port, and fertility preservation. TTE performed on 11/18/2019 (EF 65%)  --discussed the risks and benefits of treatment.  Additionally noted that we will  re-evaluate again with a PET CT scan after 4 doses (2 cycles) of chemotherapy. Last PET CT showed decrease in size and intensity of mediastinal lymph nodes, though he maintains a Deauville Score 4.   --Will have patient return to clinic q 2 weeks with continued chemotherapy   #Neutropenia, mild -ANC >2.4, ok to proceed with treatment. GCSF can be considered for severe neutropenia but is often not required.  --will proceed with chemotherapy today after we discussed the risks and benefits. He was agreeable to proceeding with treatment --HOLD GCSF support for the current cycle  #Anxiety --currently taking citalopram 10mg  daily, will increase to 20mg  PO daily --patient asks for benzodiazepines for scans, treatment, and for anxiety. I do not recommend these for this indication.  --continue discussions with social work --continue to monitor    #Thrombocytopenia, resolved --Plt rebounded to normal levels  --continue to monitor  # Symptom Management -- prescribed zofran 8mg  PO q8H PRN and compazine 10mg  q6H PRN.  --EMLA cream for port -- assure daily BM while on therapy with vincristine. Assure patient has senna docusate and miralax on hand during treatment.  --encourage claratin/ibuprofen with GCSF shot. GCSF held since Cycle 2 Day 15 --strict return precautions for shortness of breath, cough, fevers.   No orders of the defined types were placed in this encounter.  All questions were answered. The patient knows to call the clinic with any problems, questions or concerns.  A total of more than 30 minutes were spent on this encounter and over half of that time was spent on counseling and coordination of care as outlined above.   Ledell Peoples, MD Department of Hematology/Oncology [place] [facility] Phone: [phone] Pager: [phone] Email: [email or web address]  03/11/2020 10:12 AM   Literature Support:  Sharion Dove, et al.  The use of filgrastim in patients with Hodgkin lymphoma receiving ABVD. Int J Hematol Oncol Stem Cell Res. 2017;11(4):286-292.  --This study does not find evidence that the combination of bleomycin and G-CSF increases the risk for bleomycin- induced pulmonary toxicity. We recommend G-CSF use in HL patients receiving bleomycin when needed to maintain dose intensity.

## 2020-03-15 ENCOUNTER — Other Ambulatory Visit: Payer: Self-pay

## 2020-03-15 ENCOUNTER — Other Ambulatory Visit: Payer: BC Managed Care – PPO | Admitting: General Practice

## 2020-03-20 ENCOUNTER — Other Ambulatory Visit: Payer: Self-pay

## 2020-03-20 ENCOUNTER — Encounter: Payer: Self-pay | Admitting: Hematology and Oncology

## 2020-03-20 ENCOUNTER — Inpatient Hospital Stay: Payer: BC Managed Care – PPO

## 2020-03-20 ENCOUNTER — Inpatient Hospital Stay: Payer: BC Managed Care – PPO | Admitting: Hematology and Oncology

## 2020-03-20 VITALS — HR 75

## 2020-03-20 VITALS — BP 117/77 | Temp 97.0°F | Resp 20 | Ht 71.0 in | Wt 182.4 lb

## 2020-03-20 DIAGNOSIS — C8112 Nodular sclerosis classical Hodgkin lymphoma, intrathoracic lymph nodes: Secondary | ICD-10-CM

## 2020-03-20 DIAGNOSIS — Z95828 Presence of other vascular implants and grafts: Secondary | ICD-10-CM

## 2020-03-20 DIAGNOSIS — R590 Localized enlarged lymph nodes: Secondary | ICD-10-CM | POA: Diagnosis not present

## 2020-03-20 DIAGNOSIS — D709 Neutropenia, unspecified: Secondary | ICD-10-CM | POA: Diagnosis not present

## 2020-03-20 DIAGNOSIS — Z5111 Encounter for antineoplastic chemotherapy: Secondary | ICD-10-CM | POA: Diagnosis not present

## 2020-03-20 DIAGNOSIS — F419 Anxiety disorder, unspecified: Secondary | ICD-10-CM | POA: Diagnosis not present

## 2020-03-20 LAB — LACTATE DEHYDROGENASE: LDH: 153 U/L (ref 98–192)

## 2020-03-20 LAB — CMP (CANCER CENTER ONLY)
ALT: 16 U/L (ref 0–44)
AST: 17 U/L (ref 15–41)
Albumin: 4.1 g/dL (ref 3.5–5.0)
Alkaline Phosphatase: 48 U/L (ref 38–126)
Anion gap: 7 (ref 5–15)
BUN: 16 mg/dL (ref 6–20)
CO2: 25 mmol/L (ref 22–32)
Calcium: 9.1 mg/dL (ref 8.9–10.3)
Chloride: 107 mmol/L (ref 98–111)
Creatinine: 0.95 mg/dL (ref 0.61–1.24)
GFR, Estimated: >60 mL/min (ref >60)
Glucose, Bld: 94 mg/dL (ref 70–99)
Potassium: 4 mmol/L (ref 3.5–5.1)
Sodium: 139 mmol/L (ref 135–145)
Total Bilirubin: 0.9 mg/dL (ref 0.3–1.2)
Total Protein: 6.8 g/dL (ref 6.5–8.1)

## 2020-03-20 LAB — CBC WITH DIFFERENTIAL (CANCER CENTER ONLY)
Abs Immature Granulocytes: 0 10*3/uL (ref 0.00–0.07)
Basophils Absolute: 0 10*3/uL (ref 0.0–0.1)
Basophils Relative: 1 %
Eosinophils Absolute: 0 10*3/uL (ref 0.0–0.5)
Eosinophils Relative: 2 %
HCT: 37.6 % — ABNORMAL LOW (ref 39.0–52.0)
Hemoglobin: 13.4 g/dL (ref 13.0–17.0)
Immature Granulocytes: 0 %
Lymphocytes Relative: 55 %
Lymphs Abs: 1.4 10*3/uL (ref 0.7–4.0)
MCH: 30.9 pg (ref 26.0–34.0)
MCHC: 35.6 g/dL (ref 30.0–36.0)
MCV: 86.8 fL (ref 80.0–100.0)
Monocytes Absolute: 0.2 10*3/uL (ref 0.1–1.0)
Monocytes Relative: 10 %
Neutro Abs: 0.8 10*3/uL — ABNORMAL LOW (ref 1.7–7.7)
Neutrophils Relative %: 32 %
Platelet Count: 227 10*3/uL (ref 150–400)
RBC: 4.33 MIL/uL (ref 4.22–5.81)
RDW: 14.1 % (ref 11.5–15.5)
WBC Count: 2.5 10*3/uL — ABNORMAL LOW (ref 4.0–10.5)
nRBC: 0 % (ref 0.0–0.2)

## 2020-03-20 MED ORDER — DOXORUBICIN HCL CHEMO IV INJECTION 2 MG/ML
25.0000 mg/m2 | Freq: Once | INTRAVENOUS | Status: AC
  Administered 2020-03-20: 50 mg via INTRAVENOUS
  Filled 2020-03-20: qty 25

## 2020-03-20 MED ORDER — SODIUM CHLORIDE 0.9% FLUSH
10.0000 mL | INTRAVENOUS | Status: DC | PRN
  Administered 2020-03-20: 10 mL
  Filled 2020-03-20: qty 10

## 2020-03-20 MED ORDER — SODIUM CHLORIDE 0.9 % IV SOLN
10.0000 [IU]/m2 | Freq: Once | INTRAVENOUS | Status: AC
  Administered 2020-03-20: 20 [IU] via INTRAVENOUS
  Filled 2020-03-20: qty 6.67

## 2020-03-20 MED ORDER — PALONOSETRON HCL INJECTION 0.25 MG/5ML
INTRAVENOUS | Status: AC
  Filled 2020-03-20: qty 5

## 2020-03-20 MED ORDER — SODIUM CHLORIDE 0.9 % IV SOLN
375.0000 mg/m2 | Freq: Once | INTRAVENOUS | Status: AC
  Administered 2020-03-20: 760 mg via INTRAVENOUS
  Filled 2020-03-20: qty 76

## 2020-03-20 MED ORDER — SODIUM CHLORIDE 0.9 % IV SOLN
150.0000 mg | Freq: Once | INTRAVENOUS | Status: AC
  Administered 2020-03-20: 150 mg via INTRAVENOUS
  Filled 2020-03-20: qty 150

## 2020-03-20 MED ORDER — VINBLASTINE SULFATE CHEMO INJECTION 1 MG/ML
5.9500 mg/m2 | Freq: Once | INTRAVENOUS | Status: AC
  Administered 2020-03-20: 12 mg via INTRAVENOUS
  Filled 2020-03-20: qty 12

## 2020-03-20 MED ORDER — HEPARIN SOD (PORK) LOCK FLUSH 100 UNIT/ML IV SOLN
500.0000 [IU] | Freq: Once | INTRAVENOUS | Status: AC | PRN
  Administered 2020-03-20: 500 [IU]
  Filled 2020-03-20: qty 5

## 2020-03-20 MED ORDER — SODIUM CHLORIDE 0.9% FLUSH
10.0000 mL | Freq: Once | INTRAVENOUS | Status: AC
  Administered 2020-03-20: 10 mL
  Filled 2020-03-20: qty 10

## 2020-03-20 MED ORDER — SODIUM CHLORIDE 0.9 % IV SOLN
10.0000 mg | Freq: Once | INTRAVENOUS | Status: AC
  Administered 2020-03-20: 10 mg via INTRAVENOUS
  Filled 2020-03-20: qty 10

## 2020-03-20 MED ORDER — PALONOSETRON HCL INJECTION 0.25 MG/5ML
0.2500 mg | Freq: Once | INTRAVENOUS | Status: AC
  Administered 2020-03-20: 0.25 mg via INTRAVENOUS

## 2020-03-20 MED ORDER — SODIUM CHLORIDE 0.9 % IV SOLN
Freq: Once | INTRAVENOUS | Status: AC
  Filled 2020-03-20: qty 250

## 2020-03-20 NOTE — Progress Notes (Signed)
[place] [phone]   [phone]  PROGRESS NOTE  Patient Care Team: Patient, No Pcp Per as PCP - General (General Practice) Elouise Munroe, MD as PCP - Cardiology (Cardiology)  Hematological/Oncological History # Classical Hodgkin Lymphoma, Stage I.  Favorable Disease 1) 11/17/2019: presented to the emergency department with chest pain. CXR performed showed right paratracheal density. CT recommended. CT angiogram showed diffuse enlargement of the thymus gland with associated mild mediastinal adenopathy including right paratracheal enlarged lymph node corresponding to radiographic abnormality 2) 11/19/2019: CT abdomen performed showing subtle mass versus steatosis within the caudate lobe of liver; due to history of thoracic adenopathy and thymic infiltration, follow-up non emergent MR imaging of the liver with and without contrast recommended to exclude hepatic mass 3) 11/24/2019: PET CT scan performed showing hypermetabolic mediastinal adenopathy, consistent with lymphoma. (Deauville 5). No extrathoracic disease 4) 11/30/2019: EBUS performed, failed to provide adequate tissue for diagnosis 5) 12/08/2019: mediastinoscopy performed, biopsy confirmed Hodgkin's lymphoma.  6) 12/28/2019 Cycle 1 Day 1 of ABVD Chemotherapy  7) 01/10/2020: Cycle 1 Day 15 of ABVD Chemotherapy  8) 01/23/2020: Cycle 2 Day 1 of ABVD Chemotherapy  9) 02/07/2020: Cycle 2 Day 15 of ABVD Chemotherapy  10) 02/19/2020: PET CT scan showed decrease in size and degree of FDG uptake associated with previously noted FDG avid mediastinal adenopathy with SUV max of 5.19. This is compatible with Deauville criteria 4. 11) 02/23/2020: Cycle 3 Day 1 of ABVD Chemotherapy 12) 03/07/2020: Cycle 3 Day 15 of ABVD Chemotherapy 13) 03/20/2020: Cycle 4 Day 1 of ABVD Chemotherapy  Interval History:  Martin Spencer 29 y.o. male with medical history significant for classical Hodgkin Lymphoma who presents for  a follow up visit. The patient was last seen on 03/07/2020 to start Cycle 3 Day 15 of treatment. He presents today prior to Cycle 4 Day 1 of chemotherapy. In the interim he has had no changes in his health.   On exam today Martin Spencer reports that he is beginning to get a handle of the routine of chemotherapy.  He reports that he is tired for approximately 2 days and then returned to normal after his treatments.  He believes that the fatigue is mostly a "mind over matter" issue.  He endorses having a metallic taste in his mouth for a few days following his chemotherapy treatment.  He notes that he has continued to take his citalopram medication 10 mg p.o. daily and has not yet bumped up to the 20 mg p.o. dose.  He reports otherwise he is "feeling good".  He notes that he is eating normally and continues to work.  He denies having any issues with fevers, chills, sweats, nausea, vomiting or diarrhea.  Of note he recently broke up with his girlfriend Ebony Hail.  He does not believe that this break-up was related to his chemotherapy treatments.  MEDICAL HISTORY:  Past Medical History:  Diagnosis Date  . ADD (attention deficit disorder)   . Cancer ([place])   . Childhood asthma   . Pericarditis   . Shortness of breath    per patient, since recent diagnoses of pericarditis, sometimes get SOB on rest   SURGICAL HISTORY: Past Surgical History:  Procedure Laterality Date  . APPENDECTOMY    . FINE NEEDLE ASPIRATION  11/30/2019   Procedure: FINE NEEDLE ASPIRATION (FNA) LINEAR;  Surgeon: Candee Furbish, MD;  Location[facility] ENDOSCOPY;  Service: Pulmonary;;  . IR IMAGING GUIDED PORT INSERTION  12/26/2019  . LAPAROSCOPIC APPENDECTOMY  N/A 11/14/2017   Procedure: APPENDECTOMY LAPAROSCOPIC;  Surgeon: Georganna Skeans, MD;  Location: [place];  Service: General;  Laterality: N/A;  . MEDIASTINOSCOPY N/A 12/08/2019   Procedure: MEDIASTINOSCOPY;  Surgeon: Melrose Nakayama, MD;  Location: [place];  Service: Thoracic;   Laterality: N/A;  . VIDEO BRONCHOSCOPY WITH ENDOBRONCHIAL ULTRASOUND N/A 11/30/2019   Procedure: VIDEO BRONCHOSCOPY WITH ENDOBRONCHIAL ULTRASOUND;  Surgeon: Candee Furbish, MD;  Location: [facility] ENDOSCOPY;  Service: Pulmonary;  Laterality: N/A;  . WISDOM TOOTH EXTRACTION      SOCIAL HISTORY: Social History   Socioeconomic History  . Marital status: Single    Spouse name: Not on file  . Number of children: Not on file  . Years of education: Not on file  . Highest education level: Not on file  Occupational History  . Not on file  Tobacco Use  . Smoking status: Former Smoker    Types: Cigarettes  . Smokeless tobacco: Never Used  . Tobacco comment: quit in high school  Vaping Use  . Vaping Use: Never used  Substance and Sexual Activity  . Alcohol use: Yes    Comment: per patient "rarely"  . Drug use: Not Currently    Types: Marijuana    Comment: CBD last use 2 weeks ago ( 11/29/19)  . Sexual activity: Not on file  Other Topics Concern  . Not on file  Social History Narrative  . Not on file   Social Determinants of Health   Financial Resource Strain: Low Risk   . Difficulty of Paying Living Expenses: Not hard at all  Food Insecurity: No Food Insecurity  . Worried About Charity fundraiser in the Last Year: Never true  . Ran Out of Food in the Last Year: Never true  Transportation Needs: No Transportation Needs  . Lack of Transportation (Medical): No  . Lack of Transportation (Non-Medical): No  Physical Activity:   . Days of Exercise per Week: Not on file  . Minutes of Exercise per Session: Not on file  Stress: Stress Concern Present  . Feeling of Stress : Rather much  Social Connections: Unknown  . Frequency of Communication with Friends and Family: More than three times a week  . Frequency of Social Gatherings with Friends and Family: More than three times a week  . Attends Religious Services: Not on file  . Active Member of Clubs or Organizations: Not on file  . Attends  Archivist Meetings: Not on file  . Marital Status: Never married  Intimate Partner Violence:   . Fear of Current or Ex-Partner: Not on file  . Emotionally Abused: Not on file  . Physically Abused: Not on file  . Sexually Abused: Not on file    FAMILY HISTORY: Family History  Problem Relation Age of Onset  . Kidney cancer Father   . Colon cancer Paternal Grandfather     ALLERGIES:  is allergic to other.  MEDICATIONS:  Current Outpatient Medications  Medication Sig Dispense Refill  . lidocaine-prilocaine (EMLA) cream Apply 1 application topically as needed. (Patient taking differently: Apply 1 application topically as needed (port). ) 30 g 1  . acetaminophen (TYLENOL) 500 MG tablet Take 500 mg by mouth every 6 (six) hours as needed for mild pain or headache.    . citalopram (CELEXA) 20 MG tablet Take 1 tablet (20 mg total) by mouth daily. 30 tablet 2  . ondansetron (ZOFRAN) 8 MG tablet Take 1 tablet (8 mg total) by mouth every 8 (eight)  hours as needed for nausea or vomiting. 20 tablet 1  . prochlorperazine (COMPAZINE) 10 MG tablet Take 1 tablet (10 mg total) by mouth every 6 (six) hours as needed for nausea or vomiting. 30 tablet 1  . traMADol (ULTRAM) 50 MG tablet Take 1 tablet (50 mg total) by mouth every 6 (six) hours as needed for moderate pain. 10 tablet 0  . triamcinolone lotion (KENALOG) 0.1 % Apply 1 application topically 3 (three) times daily. 120 mL 1   No current facility-administered medications for this visit.   Facility-Administered Medications Ordered in Other Visits  Medication Dose Route Frequency Provider Last Rate Last Admin  . bleomycin (BLEOCIN) 20 Units in sodium chloride 0.9 % 50 mL chemo infusion  10 Units/m2 (Treatment Plan Recorded) Intravenous Once Ledell Peoples IV, MD      . dacarbazine (DTIC) 760 mg in sodium chloride 0.9 % 250 mL chemo infusion  375 mg/m2 (Treatment Plan Recorded) Intravenous Once Orson Slick, MD      . DOXOrubicin  (ADRIAMYCIN) chemo injection 50 mg  25 mg/m2 (Treatment Plan Recorded) Intravenous Once Ledell Peoples IV, MD      . heparin lock flush 100 unit/mL  500 Units Intracatheter Once PRN Ledell Peoples IV, MD      . sodium chloride flush (NS) 0.9 % injection 10 mL  10 mL Intracatheter PRN Ledell Peoples IV, MD      . vinBLAStine (VELBAN) 12 mg in sodium chloride 0.9 % 50 mL chemo infusion  5.95 mg/m2 (Treatment Plan Recorded) Intravenous Once Orson Slick, MD        REVIEW OF SYSTEMS:   Constitutional: ( - ) fevers, ( - )  chills , ( - ) night sweats Eyes: ( - ) blurriness of vision, ( - ) double vision, ( - ) watery eyes Ears, nose, mouth, throat, and face: ( - ) mucositis, ( - ) sore throat Respiratory: ( - ) cough, ( - ) dyspnea, ( - ) wheezes Cardiovascular: ( - ) palpitation, ( - ) chest discomfort, ( - ) lower extremity swelling Gastrointestinal:  ( - ) nausea, ( - ) heartburn, ( - ) change in bowel habits Skin: ( - ) abnormal skin rashes Lymphatics: ( - ) new lymphadenopathy, ( - ) easy bruising Neurological: ( - ) numbness, ( - ) tingling, ( - ) new weaknesses Behavioral/Psych: ( - ) mood change, ( - ) new changes  All other systems were reviewed with the patient and are negative.  PHYSICAL EXAMINATION: ECOG PERFORMANCE STATUS: 0 - Asymptomatic  Vitals:   03/20/20 1050  BP: 117/77  Resp: 20  Temp: (!) 97 F (36.1 C)  SpO2: 100%   Filed Weights   03/20/20 1050  Weight: 182 lb 6.4 oz (82.7 kg)    GENERAL: well appearing fit young Caucasian male. alert, no distress and comfortable SKIN: skin color, texture, turgor are normal, no rashes or significant lesions. Small surgical incision at base of neck, well healed.  EYES: conjunctiva are pink and non-injected, sclera clear LUNGS: clear to auscultation and percussion with normal breathing effort HEART: regular rate & rhythm and no murmurs and no lower extremity edema Musculoskeletal: no cyanosis of digits and no clubbing   PSYCH: alert & oriented x 3, fluent speech NEURO: no focal motor/sensory deficits  LABORATORY DATA:  I have reviewed the data as listed CBC Latest Ref Rng & Units 03/20/2020 03/07/2020 02/23/2020  WBC 4.0 - 10.5 K/uL  2.5(L) 3.4(L) 2.8(L)  Hemoglobin 13.0 - 17.0 g/dL 13.4 13.2 13.3  Hematocrit 39 - 52 % 37.6(L) 37.7(L) 38.3(L)  Platelets 150 - 400 K/uL 227 207 302    CMP Latest Ref Rng & Units 03/20/2020 03/07/2020 02/23/2020  Glucose 70 - 99 mg/dL 94 96 89  BUN 6 - 20 mg/dL 16 9 7   Creatinine 0.61 - 1.24 mg/dL 0.95 0.82 0.85  Sodium 135 - 145 mmol/L 139 139 138  Potassium 3.5 - 5.1 mmol/L 4.0 3.7 3.8  Chloride 98 - 111 mmol/L 107 106 106  CO2 22 - 32 mmol/L 25 27 26   Calcium 8.9 - 10.3 mg/dL 9.1 9.3 9.6  Total Protein 6.5 - 8.1 g/dL 6.8 7.2 7.1  Total Bilirubin 0.3 - 1.2 mg/dL 0.9 1.8(H) 1.5(H)  Alkaline Phos 38 - 126 U/L 48 50 55  AST 15 - 41 U/L 17 23 24   ALT 0 - 44 U/L 16 24 34    RADIOGRAPHIC STUDIES: I have personally review the images and agree with the findings below: marked FDG avid mediastinal lymph nodes. Mild uptake under left arm (prior COVID vaccine). No disease noted elsewhere  No results found.  ASSESSMENT & PLAN Martin Spencer 29 y.o. male with medical history significant for classical Hodgkin Lymphoma stage I who presents for a follow up visit. Today is Cycle 4 Day 1 of chemotherapy.   On exam today Martin Spencer is tolerating chemotherapy quite well.  He is not having any issues with fevers, chills, sweats, nausea, running or diarrhea.  His outlook does appear brightened, however he did recently break-up with his girlfriend last weekend.  He is willing and able to proceed with treatment today.  Previously we discussed the patient has a stage I classical Hodgkin's lymphoma, favorable disease.  He is young and otherwise fit and therefore would be able to tolerate full ABVD chemotherapy.  We discussed expected side effects including nausea, vomiting,  constipation, nerve damage, fertility loss, hair loss, and possible secondary malignancy.  The patient and his parents voiced their understanding of the reasoning behind obtaining PFTs, chemotherapy education, port placement, and fertility preservation at [facility].  They have the opportunity to ask all questions and concerns regarding treatment moving forward.  The regimen of choice for this stage I classical Hodgkin's lymphoma, favorable disease would consist of ABVD chemotherapy.  ABVD is comprised of doxorubicin 25 mg/m IV, bleomycin 10 units/m IV, vinblastine 6 g/m IV, and dacarbazine 375 mg/m IV all to be administered on days 1 and 15 of a 28-day cycle.  The patient completed 2 cycles of this and unfortunately due to a Deauville Score of 4 we would recommend an additional 2 cycles of ABVD. After 2 more cycles the patient is to undergo a PET CT scan at which time treatment moving forward can be dictated by response to therapy.  # Classical Hodgkin Lymphoma, Stage I.  Favorable Disease.  --Cycle 4 Day 1 ABVD chemotherapy today --patient has completed PFTs, Port, and fertility preservation. TTE performed on 11/18/2019 (EF 65%)  --discussed the risks and benefits of treatment. Additionally noted that we will re-evaluate again with a PET CT scan after 4 doses (2 cycles) of chemotherapy. Last PET CT showed decrease in size and intensity of mediastinal lymph nodes, though he maintains a Deauville Score 4.  PET request placed today --Will have patient return to clinic q 2 weeks with continued chemotherapy   #Neutropenia, mild -ANC 0.8, ok to proceed with treatment. GCSF can  be considered for severe neutropenia but is often not required.  --will proceed with chemotherapy today after we discussed the risks and benefits. He was agreeable to proceeding with treatment --HOLD GCSF support for the current cycle  #Anxiety --currently taking citalopram 20mg  PO daily --patient asks for  benzodiazepines for scans, treatment, and for anxiety. I do not recommend these for this indication.  --continue discussions with social work --continue to monitor    #Thrombocytopenia, resolved --Plt rebounded to normal levels  --continue to monitor  # Symptom Management -- prescribed zofran 8mg  PO q8H PRN and compazine 10mg  q6H PRN.  --EMLA cream for port -- assure daily BM while on therapy with vincristine. Assure patient has senna docusate and miralax on hand during treatment.  --encourage claratin/ibuprofen with GCSF shot. GCSF held since Cycle 2 Day 15 --strict return precautions for shortness of breath, cough, fevers.   Orders Placed This Encounter  Procedures  . NM PET Image Restag (PS) Skull Base To Thigh    Standing Status:   Future    Standing Expiration Date:   03/20/2021    Order Specific Question:   If indicated for the ordered procedure, I authorize the administration of a radiopharmaceutical per Radiology protocol    Answer:   Yes    Order Specific Question:   Preferred imaging location?    Answer:   [facility]   All questions were answered. The patient knows to call the clinic with any problems, questions or concerns.  A total of more than 30 minutes were spent on this encounter and over half of that time was spent on counseling and coordination of care as outlined above.   Ledell Peoples, MD Department of Hematology/Oncology [place] at [facility] Phone: [phone] Pager: [phone] Email: [email or web address]  03/20/2020 1:41 PM   Literature Support:  Sharion Dove, et al. The use of filgrastim in patients with Hodgkin lymphoma receiving ABVD. Int J Hematol Oncol Stem Cell Res. 2017;11(4):286-292.  --This study does not find evidence that the combination of bleomycin and G-CSF increases the risk for bleomycin- induced pulmonary toxicity. We recommend G-CSF use in HL patients receiving bleomycin when  needed to maintain dose intensity.

## 2020-03-20 NOTE — Patient Instructions (Signed)
[facility] Discharge Instructions for Patients Receiving Chemotherapy  Today you received the following chemotherapy agents Doxorubicin (ADRIAMYCIN), Vinblastine (VELBAN), Bleomycin (BLEOCIN) & Dacarbazine (DTIC).  To help prevent nausea and vomiting after your treatment, we encourage you to take your nausea medication as prescribed.   If you develop nausea and vomiting that is not controlled by your nausea medication, call the clinic.   BELOW ARE SYMPTOMS THAT SHOULD BE REPORTED IMMEDIATELY:  *FEVER GREATER THAN 100.5 F  *CHILLS WITH OR WITHOUT FEVER  NAUSEA AND VOMITING THAT IS NOT CONTROLLED WITH YOUR NAUSEA MEDICATION  *UNUSUAL SHORTNESS OF BREATH  *UNUSUAL BRUISING OR BLEEDING  TENDERNESS IN MOUTH AND THROAT WITH OR WITHOUT PRESENCE OF ULCERS  *URINARY PROBLEMS  *BOWEL PROBLEMS  UNUSUAL RASH Items with * indicate a potential emergency and should be followed up as soon as possible.  Feel free to call the clinic should you have any questions or concerns. The clinic phone number is [phone].  Please show the Ages at check-in to the Emergency Department and triage nurse.

## 2020-03-22 ENCOUNTER — Other Ambulatory Visit: Payer: Self-pay

## 2020-03-22 ENCOUNTER — Inpatient Hospital Stay: Payer: BC Managed Care – PPO | Admitting: General Practice

## 2020-03-22 DIAGNOSIS — C8112 Nodular sclerosis classical Hodgkin lymphoma, intrathoracic lymph nodes: Secondary | ICD-10-CM

## 2020-03-22 NOTE — Progress Notes (Signed)
[facility] CSW Progress Notes  Met w patient in office, as scheduled.  Reviewed progress as he goes through treatment.  He has broken up with girlfriend, but feels this is a positive move forward.  In some senses, his life is on "hold" while he completes treatment and awaits next scan in late December after his next/last scheduled infusion.  He does have plans for future, including either graduate school or relocating to [place].  He continues to value physical health and fitness, exercises often and is generally feeling well.  Discussed his desire to have momentum in his life, including activity/action/future focus.  Discussed ways to have this feeling vs feeling stuck/debilitated/unproductive.  Explored various options for this kind of movement in life given his current treatment status, he may consider options for young adult cancer survivors like [facility].  Will see next week.  Edwyna Shell, LCSW Clinical Social Worker Phone:  [phone]

## 2020-03-28 DIAGNOSIS — F411 Generalized anxiety disorder: Secondary | ICD-10-CM | POA: Diagnosis not present

## 2020-03-29 ENCOUNTER — Other Ambulatory Visit: Payer: BC Managed Care – PPO | Admitting: General Practice

## 2020-04-03 ENCOUNTER — Encounter: Payer: Self-pay | Admitting: Hematology and Oncology

## 2020-04-03 ENCOUNTER — Other Ambulatory Visit: Payer: Self-pay

## 2020-04-03 ENCOUNTER — Inpatient Hospital Stay: Payer: BC Managed Care – PPO

## 2020-04-03 ENCOUNTER — Encounter: Payer: Self-pay | Admitting: General Practice

## 2020-04-03 ENCOUNTER — Inpatient Hospital Stay: Payer: BC Managed Care – PPO | Attending: Hematology and Oncology | Admitting: Hematology and Oncology

## 2020-04-03 VITALS — BP 106/76 | HR 78 | Temp 96.6°F | Resp 18 | Ht 71.0 in | Wt 180.1 lb

## 2020-04-03 DIAGNOSIS — C8112 Nodular sclerosis classical Hodgkin lymphoma, intrathoracic lymph nodes: Secondary | ICD-10-CM | POA: Diagnosis not present

## 2020-04-03 DIAGNOSIS — R590 Localized enlarged lymph nodes: Secondary | ICD-10-CM

## 2020-04-03 DIAGNOSIS — Z95828 Presence of other vascular implants and grafts: Secondary | ICD-10-CM | POA: Diagnosis not present

## 2020-04-03 DIAGNOSIS — Z5111 Encounter for antineoplastic chemotherapy: Secondary | ICD-10-CM | POA: Insufficient documentation

## 2020-04-03 LAB — CBC WITH DIFFERENTIAL (CANCER CENTER ONLY)
Abs Immature Granulocytes: 0 10*3/uL (ref 0.00–0.07)
Basophils Absolute: 0 10*3/uL (ref 0.0–0.1)
Basophils Relative: 1 %
Eosinophils Absolute: 0.1 10*3/uL (ref 0.0–0.5)
Eosinophils Relative: 2 %
HCT: 39.7 % (ref 39.0–52.0)
Hemoglobin: 13.9 g/dL (ref 13.0–17.0)
Immature Granulocytes: 0 %
Lymphocytes Relative: 49 %
Lymphs Abs: 1.2 10*3/uL (ref 0.7–4.0)
MCH: 30.5 pg (ref 26.0–34.0)
MCHC: 35 g/dL (ref 30.0–36.0)
MCV: 87.3 fL (ref 80.0–100.0)
Monocytes Absolute: 0.4 10*3/uL (ref 0.1–1.0)
Monocytes Relative: 16 %
Neutro Abs: 0.8 10*3/uL — ABNORMAL LOW (ref 1.7–7.7)
Neutrophils Relative %: 32 %
Platelet Count: 240 10*3/uL (ref 150–400)
RBC: 4.55 MIL/uL (ref 4.22–5.81)
RDW: 13.9 % (ref 11.5–15.5)
WBC Count: 2.5 10*3/uL — ABNORMAL LOW (ref 4.0–10.5)
nRBC: 0 % (ref 0.0–0.2)

## 2020-04-03 LAB — CMP (CANCER CENTER ONLY)
ALT: 20 U/L (ref 0–44)
AST: 24 U/L (ref 15–41)
Albumin: 4.2 g/dL (ref 3.5–5.0)
Alkaline Phosphatase: 49 U/L (ref 38–126)
Anion gap: 9 (ref 5–15)
BUN: 12 mg/dL (ref 6–20)
CO2: 25 mmol/L (ref 22–32)
Calcium: 9.7 mg/dL (ref 8.9–10.3)
Chloride: 106 mmol/L (ref 98–111)
Creatinine: 0.94 mg/dL (ref 0.61–1.24)
GFR, Estimated: >60 mL/min (ref >60)
Glucose, Bld: 90 mg/dL (ref 70–99)
Potassium: 4.2 mmol/L (ref 3.5–5.1)
Sodium: 140 mmol/L (ref 135–145)
Total Bilirubin: 1.1 mg/dL (ref 0.3–1.2)
Total Protein: 7.4 g/dL (ref 6.5–8.1)

## 2020-04-03 LAB — LACTATE DEHYDROGENASE: LDH: 192 U/L (ref 98–192)

## 2020-04-03 MED ORDER — SODIUM CHLORIDE 0.9 % IV SOLN
10.0000 [IU]/m2 | Freq: Once | INTRAVENOUS | Status: AC
  Administered 2020-04-03: 20 [IU] via INTRAVENOUS
  Filled 2020-04-03: qty 6.67

## 2020-04-03 MED ORDER — SODIUM CHLORIDE 0.9 % IV SOLN
10.0000 mg | Freq: Once | INTRAVENOUS | Status: AC
  Administered 2020-04-03: 10 mg via INTRAVENOUS
  Filled 2020-04-03: qty 10

## 2020-04-03 MED ORDER — DOXORUBICIN HCL CHEMO IV INJECTION 2 MG/ML
25.0000 mg/m2 | Freq: Once | INTRAVENOUS | Status: AC
  Administered 2020-04-03: 50 mg via INTRAVENOUS
  Filled 2020-04-03: qty 25

## 2020-04-03 MED ORDER — SODIUM CHLORIDE 0.9% FLUSH
10.0000 mL | Freq: Once | INTRAVENOUS | Status: AC
  Administered 2020-04-03: 10 mL
  Filled 2020-04-03: qty 10

## 2020-04-03 MED ORDER — SODIUM CHLORIDE 0.9 % IV SOLN
375.0000 mg/m2 | Freq: Once | INTRAVENOUS | Status: AC
  Administered 2020-04-03: 760 mg via INTRAVENOUS
  Filled 2020-04-03: qty 76

## 2020-04-03 MED ORDER — VINBLASTINE SULFATE CHEMO INJECTION 1 MG/ML
5.9500 mg/m2 | Freq: Once | INTRAVENOUS | Status: AC
  Administered 2020-04-03: 12 mg via INTRAVENOUS
  Filled 2020-04-03: qty 12

## 2020-04-03 MED ORDER — HEPARIN SOD (PORK) LOCK FLUSH 100 UNIT/ML IV SOLN
500.0000 [IU] | Freq: Once | INTRAVENOUS | Status: AC | PRN
  Administered 2020-04-03: 500 [IU]
  Filled 2020-04-03: qty 5

## 2020-04-03 MED ORDER — SODIUM CHLORIDE 0.9 % IV SOLN
150.0000 mg | Freq: Once | INTRAVENOUS | Status: AC
  Administered 2020-04-03: 150 mg via INTRAVENOUS
  Filled 2020-04-03: qty 150

## 2020-04-03 MED ORDER — SODIUM CHLORIDE 0.9 % IV SOLN
Freq: Once | INTRAVENOUS | Status: AC
  Filled 2020-04-03: qty 250

## 2020-04-03 MED ORDER — PALONOSETRON HCL INJECTION 0.25 MG/5ML
0.2500 mg | Freq: Once | INTRAVENOUS | Status: AC
  Administered 2020-04-03: 0.25 mg via INTRAVENOUS

## 2020-04-03 MED ORDER — CITALOPRAM HYDROBROMIDE 20 MG PO TABS
20.0000 mg | ORAL_TABLET | Freq: Every day | ORAL | 2 refills | Status: DC
Start: 2020-04-03 — End: 2020-06-20

## 2020-04-03 MED ORDER — PALONOSETRON HCL INJECTION 0.25 MG/5ML
INTRAVENOUS | Status: AC
  Filled 2020-04-03: qty 5

## 2020-04-03 NOTE — Progress Notes (Signed)
Per Dr. Lorenso Courier, ok to treat with ANC 0.8.

## 2020-04-03 NOTE — Progress Notes (Signed)
[place] [phone]   [phone]) [phone]  PROGRESS NOTE  Patient Care Team: Patient, No Pcp Per as PCP - General (General Practice) Elouise Munroe, MD as PCP - Cardiology (Cardiology)  Hematological/Oncological History # Classical Hodgkin Lymphoma, Stage I.  Favorable Disease 1) 11/17/2019: presented to the emergency department with chest pain. CXR performed showed right paratracheal density. CT recommended. CT angiogram showed diffuse enlargement of the thymus gland with associated mild mediastinal adenopathy including right paratracheal enlarged lymph node corresponding to radiographic abnormality 2) 11/19/2019: CT abdomen performed showing subtle mass versus steatosis within the caudate lobe of liver; due to history of thoracic adenopathy and thymic infiltration, follow-up non emergent MR imaging of the liver with and without contrast recommended to exclude hepatic mass 3) 11/24/2019: PET CT scan performed showing hypermetabolic mediastinal adenopathy, consistent with lymphoma. (Deauville 5). No extrathoracic disease 4) 11/30/2019: EBUS performed, failed to provide adequate tissue for diagnosis 5) 12/08/2019: mediastinoscopy performed, biopsy confirmed Hodgkin's lymphoma.  6) 12/28/2019 Cycle 1 Day 1 of ABVD Chemotherapy  7) 01/10/2020: Cycle 1 Day 15 of ABVD Chemotherapy  8) 01/23/2020: Cycle 2 Day 1 of ABVD Chemotherapy  9) 02/07/2020: Cycle 2 Day 15 of ABVD Chemotherapy  10) 02/19/2020: PET CT scan showed decrease in size and degree of FDG uptake associated with previously noted FDG avid mediastinal adenopathy with SUV max of 5.19. This is compatible with Deauville criteria 4. 11) 02/23/2020: Cycle 3 Day 1 of ABVD Chemotherapy 12) 03/07/2020: Cycle 3 Day 15 of ABVD Chemotherapy 13) 03/20/2020: Cycle 4 Day 1 of ABVD Chemotherapy 14) 04/03/2020:  Cycle 4 Day 15 of ABVD Chemotherapy  Interval History:  Martin Spencer 29 y.o. male with medical history  significant for classical Hodgkin Lymphoma who presents for a follow up visit. The patient was last seen on 03/20/2020 to start Cycle 4 Day 1 of treatment. He presents today prior to Cycle 4 Day 15 of chemotherapy. In the interim he has had no changes in his health.   On exam today [name] is accompanied by his father.  He reports that after the last treatment he did have some fatigue that lasted a few days.  He also reports being slightly nauseous.  He reports that after receiving steroids during his last treatment he had a strong boost in energy and focus and feels like he was playing videogames much better than his usual baseline.  His game of choice at this time is Halo, the new one that is come out.  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  Otherwise he "feels healthy".  He denies any fevers, chills, sweats, vomiting, or diarrhea.  He is asked to see the chaplain today for brief prayer prior to his last cycle of chemotherapy before his PET CT scan.  A full 10 point ROS is listed below.  MEDICAL HISTORY:  Past Medical History:  Diagnosis Date  . ADD (attention deficit disorder)   . Cancer ([place])   . Childhood asthma   . Pericarditis   . Shortness of breath    per patient, since recent diagnoses of pericarditis, sometimes get SOB on rest   SURGICAL HISTORY: Past Surgical History:  Procedure Laterality Date  . APPENDECTOMY    . FINE NEEDLE ASPIRATION  11/30/2019   Procedure: FINE NEEDLE ASPIRATION (FNA) LINEAR;  Surgeon: Candee Furbish, MD;  Location: [facility] ENDOSCOPY;  Service: Pulmonary;;  . IR IMAGING GUIDED PORT INSERTION  12/26/2019  . LAPAROSCOPIC APPENDECTOMY N/A 11/14/2017   Procedure: APPENDECTOMY LAPAROSCOPIC;  Surgeon: Georganna Skeans, MD;  Location: [place];  Service: General;  Laterality: N/A;  . MEDIASTINOSCOPY N/A 12/08/2019   Procedure: MEDIASTINOSCOPY;  Surgeon: Melrose Nakayama, MD;  Location: [place];  Service: Thoracic;   Laterality: N/A;  . VIDEO BRONCHOSCOPY WITH ENDOBRONCHIAL ULTRASOUND N/A 11/30/2019   Procedure: VIDEO BRONCHOSCOPY WITH ENDOBRONCHIAL ULTRASOUND;  Surgeon: Candee Furbish, MD;  Location[facility] ENDOSCOPY;  Service: Pulmonary;  Laterality: N/A;  . WISDOM TOOTH EXTRACTION      SOCIAL HISTORY: Social History   Socioeconomic History  . Marital status: Single    Spouse name: Not on file  . Number of children: Not on file  . Years of education: Not on file  . Highest education level: Not on file  Occupational History  . Not on file  Tobacco Use  . Smoking status: Former Smoker    Types: Cigarettes  . Smokeless tobacco: Never Used  . Tobacco comment: quit in high school  Vaping Use  . Vaping Use: Never used  Substance and Sexual Activity  . Alcohol use: Yes    Comment: per patient "rarely"  . Drug use: Not Currently    Types: Marijuana    Comment: CBD last use 2 weeks ago ( 11/29/19)  . Sexual activity: Not on file  Other Topics Concern  . Not on file  Social History Narrative  . Not on file   Social Determinants of Health   Financial Resource Strain: Low Risk   . Difficulty of Paying Living Expenses: Not hard at all  Food Insecurity: No Food Insecurity  . Worried About Charity fundraiser in the Last Year: Never true  . Ran Out of Food in the Last Year: Never true  Transportation Needs: No Transportation Needs  . Lack of Transportation (Medical): No  . Lack of Transportation (Non-Medical): No  Physical Activity:   . Days of Exercise per Week: Not on file  . Minutes of Exercise per Session: Not on file  Stress: Stress Concern Present  . Feeling of Stress : Rather much  Social Connections: Unknown  . Frequency of Communication with Friends and Family: More than three times a week  . Frequency of Social Gatherings with Friends and Family: More than three times a week  . Attends Religious Services: Not on file  . Active Member of Clubs or Organizations: Not on file  . Attends  Archivist Meetings: Not on file  . Marital Status: Never married  Intimate Partner Violence:   . Fear of Current or Ex-Partner: Not on file  . Emotionally Abused: Not on file  . Physically Abused: Not on file  . Sexually Abused: Not on file    FAMILY HISTORY: Family History  Problem Relation Age of Onset  . Kidney cancer Father   . Colon cancer Paternal Grandfather     ALLERGIES:  is allergic to other.  MEDICATIONS:  Current Outpatient Medications  Medication Sig Dispense Refill  . acetaminophen (TYLENOL) 500 MG tablet Take 500 mg by mouth every 6 (six) hours as needed for mild pain or headache.    . citalopram (CELEXA) 20 MG tablet Take 1 tablet (20 mg total) by mouth daily. 30 tablet 2  . lidocaine-prilocaine (EMLA) cream Apply 1 application topically as needed. (Patient taking differently: Apply 1 application topically as needed (port). ) 30 g 1  . ondansetron (ZOFRAN) 8 MG tablet Take 1 tablet (8 mg total)  by mouth every 8 (eight) hours as needed for nausea or vomiting. 20 tablet 1  . prochlorperazine (COMPAZINE) 10 MG tablet Take 1 tablet (10 mg total) by mouth every 6 (six) hours as needed for nausea or vomiting. 30 tablet 1  . traMADol (ULTRAM) 50 MG tablet Take 1 tablet (50 mg total) by mouth every 6 (six) hours as needed for moderate pain. 10 tablet 0  . triamcinolone lotion (KENALOG) 0.1 % Apply 1 application topically 3 (three) times daily. 120 mL 1   No current facility-administered medications for this visit.   Facility-Administered Medications Ordered in Other Visits  Medication Dose Route Frequency Provider Last Rate Last Admin  . bleomycin (BLEOCIN) 20 Units in sodium chloride 0.9 % 50 mL chemo infusion  10 Units/m2 (Treatment Plan Recorded) Intravenous Once Ledell Peoples IV, MD      . dacarbazine (DTIC) 760 mg in sodium chloride 0.9 % 250 mL chemo infusion  375 mg/m2 (Treatment Plan Recorded) Intravenous Once Ledell Peoples IV, MD      . heparin lock  flush 100 unit/mL  500 Units Intracatheter Once PRN Orson Slick, MD      . vinBLAStine (VELBAN) 12 mg in sodium chloride 0.9 % 50 mL chemo infusion  5.95 mg/m2 (Treatment Plan Recorded) Intravenous Once Orson Slick, MD        REVIEW OF SYSTEMS:   Constitutional: ( - ) fevers, ( - )  chills , ( - ) night sweats Eyes: ( - ) blurriness of vision, ( - ) double vision, ( - ) watery eyes Ears, nose, mouth, throat, and face: ( - ) mucositis, ( - ) sore throat Respiratory: ( - ) cough, ( - ) dyspnea, ( - ) wheezes Cardiovascular: ( - ) palpitation, ( - ) chest discomfort, ( - ) lower extremity swelling Gastrointestinal:  ( - ) nausea, ( - ) heartburn, ( - ) change in bowel habits Skin: ( - ) abnormal skin rashes Lymphatics: ( - ) new lymphadenopathy, ( - ) easy bruising Neurological: ( - ) numbness, ( - ) tingling, ( - ) new weaknesses Behavioral/Psych: ( - ) mood change, ( - ) new changes  All other systems were reviewed with the patient and are negative.  PHYSICAL EXAMINATION: ECOG PERFORMANCE STATUS: 0 - Asymptomatic  Vitals:   04/03/20 1106  BP: 106/76  Pulse: 78  Resp: 18  Temp: (!) 96.6 F (35.9 C)  SpO2: 100%   Filed Weights   04/03/20 1106  Weight: 180 lb 1.6 oz (81.7 kg)    GENERAL: well appearing fit young Caucasian male. alert, no distress and comfortable SKIN: skin color, texture, turgor are normal, no rashes or significant lesions. Small surgical incision at base of neck, well healed.  EYES: conjunctiva are pink and non-injected, sclera clear LUNGS: clear to auscultation and percussion with normal breathing effort HEART: regular rate & rhythm and no murmurs and no lower extremity edema Musculoskeletal: no cyanosis of digits and no clubbing  PSYCH: alert & oriented x 3, fluent speech NEURO: no focal motor/sensory deficits  LABORATORY DATA:  I have reviewed the data as listed CBC Latest Ref Rng & Units 04/03/2020 03/20/2020 03/07/2020  WBC 4.0 - 10.5 K/uL  2.5(L) 2.5(L) 3.4(L)  Hemoglobin 13.0 - 17.0 g/dL 13.9 13.4 13.2  Hematocrit 39 - 52 % 39.7 37.6(L) 37.7(L)  Platelets 150 - 400 K/uL 240 227 207    CMP Latest Ref Rng & Units 04/03/2020 03/20/2020 03/07/2020  Glucose 70 - 99 mg/dL 90 94 96  BUN 6 - 20 mg/dL 12 16 9   Creatinine 0.61 - 1.24 mg/dL 0.94 0.95 0.82  Sodium 135 - 145 mmol/L 140 139 139  Potassium 3.5 - 5.1 mmol/L 4.2 4.0 3.7  Chloride 98 - 111 mmol/L 106 107 106  CO2 22 - 32 mmol/L 25 25 27   Calcium 8.9 - 10.3 mg/dL 9.7 9.1 9.3  Total Protein 6.5 - 8.1 g/dL 7.4 6.8 7.2  Total Bilirubin 0.3 - 1.2 mg/dL 1.1 0.9 1.8(H)  Alkaline Phos 38 - 126 U/L 49 48 50  AST 15 - 41 U/L 24 17 23   ALT 0 - 44 U/L 20 16 24     RADIOGRAPHIC STUDIES: I have personally review the images and agree with the findings below: marked FDG avid mediastinal lymph nodes. Mild uptake under left arm (prior COVID vaccine). No disease noted elsewhere  No results found.  ASSESSMENT & PLAN KMARI HALTER 29 y.o. male with medical history significant for classical Hodgkin Lymphoma stage I who presents for a follow up visit. Today is Cycle 4 Day 15 of chemotherapy.   On exam today Martin Spencer is tolerating chemotherapy quite well.  He is not having any issues with fevers, chills, sweats, vomiting or diarrhea.  He is willing and able to proceed with treatment at this time.  Previously we discussed the patient has a stage I classical Hodgkin's lymphoma, favorable disease.  He is young and otherwise fit and therefore would be able to tolerate full ABVD chemotherapy.  We discussed expected side effects including nausea, vomiting, constipation, nerve damage, fertility loss, hair loss, and possible secondary malignancy.  The patient and his parents voiced their understanding of the reasoning behind obtaining PFTs, chemotherapy education, port placement, and fertility preservation at [facility].  They have the opportunity to ask all questions and  concerns regarding treatment moving forward.  The regimen of choice for this stage I classical Hodgkin's lymphoma, favorable disease would consist of ABVD chemotherapy.  ABVD is comprised of doxorubicin 25 mg/m IV, bleomycin 10 units/m IV, vinblastine 6 g/m IV, and dacarbazine 375 mg/m IV all to be administered on days 1 and 15 of a 28-day cycle.  The patient completed 2 cycles of this and unfortunately due to a Deauville Score of 4 we would recommend an additional 2 cycles of ABVD. After 2 more cycles the patient is to undergo a PET CT scan at which time treatment moving forward can be dictated by response to therapy.  # Classical Hodgkin Lymphoma, Stage I.  Favorable Disease.  --Cycle 4 Day 15 ABVD chemotherapy today --patient has completed PFTs, Port, and fertility preservation. TTE performed on 11/18/2019 (EF 65%)  --discussed the risks and benefits of treatment. Additionally noted that we will re-evaluate again with a PET CT scan after 4 doses (2 cycles) of chemotherapy. Last PET CT showed decrease in size and intensity of mediastinal lymph nodes, though he maintains a Deauville Score 4.  PET request placed today --Will have patient return to clinic after his PET CT scan (04/17/2020)   #Neutropenia, mild -ANC 0.8, ok to proceed with treatment. GCSF can be considered for severe neutropenia but is often not required.  --will proceed with chemotherapy today after we discussed the risks and benefits. He was agreeable to proceeding with treatment --HOLD GCSF support for the current cycle  #Anxiety --currently taking citalopram 20mg  PO daily --patient asks for benzodiazepines for scans, treatment, and for anxiety. I do not  recommend benzos for this indication.  --continue discussions with social work --continue to monitor    #Thrombocytopenia, resolved --Plt rebounded to normal levels  --continue to monitor  # Symptom Management -- prescribed zofran 8mg  PO q8H PRN and compazine 10mg  q6H  PRN.  --EMLA cream for port -- assure daily BM while on therapy with vincristine. Assure patient has senna docusate and miralax on hand during treatment.  --encourage claratin/ibuprofen with GCSF shot. GCSF held since Cycle 2 Day 15 --strict return precautions for shortness of breath, cough, fevers.   No orders of the defined types were placed in this encounter.  All questions were answered. The patient knows to call the clinic with any problems, questions or concerns.  A total of more than 30 minutes were spent on this encounter and over half of that time was spent on counseling and coordination of care as outlined above.   Ledell Peoples, MD Department of Hematology/Oncology [place] at [facility] Phone: [phone] Pager: [phone] Email: [email or web address]  04/03/2020 2:31 PM

## 2020-04-03 NOTE — Progress Notes (Signed)
Patient completed infusion without incident. In no visible distress at time of discharge. Ambulated out of cancer center. AVS declined. 

## 2020-04-03 NOTE — Progress Notes (Signed)
[place] Spiritual Care Note  Met with Tiffany Kocher in infusion, providing prayer and blessing per his request. Built pastoral rapport via empathic, reflective listening. We plan for me to follow up by phone after his 12/21 scan to provide opportunity to process results.   [place], [place], [facility] Pager [phone] Voicemail [phone]

## 2020-04-05 ENCOUNTER — Inpatient Hospital Stay: Payer: BC Managed Care – PPO | Admitting: General Practice

## 2020-04-05 DIAGNOSIS — C8112 Nodular sclerosis classical Hodgkin lymphoma, intrathoracic lymph nodes: Secondary | ICD-10-CM

## 2020-04-05 NOTE — Progress Notes (Signed)
[place] CSW Progress Notes  Met w patient in office, reviewed his progress.  He has started back on prescription medications to help w cognitive function and focus (Adderall).  He is connected w his former prescriber of this medication Lonell Grandchild DNP). He was asked to communicate directly with his oncologist about this decision so oncologist is fully informed - patient states he will do so.  He feels he had a productive week at work - much different than he had been able to have in the past. Medication alleviated his fatigue, lack of motivation and inability to focus.  Discussed need to think about the "big picture" of his lifetime trajectory and decide if this medication is beneficial long term.  Reviewed goals and dreams for life - he is energized by feeling productive and in control of his life, does not like the feeling of lethargy or nonproductivity.  Discusses risks/benefits of current decision making process.  He is quite anxious about his upcoming scan, asks for reassurance that "it will be ok, this cancer must have shrunk."  Edwyna Shell, [place] Worker Phone:  [phone]

## 2020-04-12 ENCOUNTER — Other Ambulatory Visit: Payer: BC Managed Care – PPO | Admitting: General Practice

## 2020-04-16 ENCOUNTER — Ambulatory Visit (HOSPITAL_COMMUNITY)
Admission: RE | Admit: 2020-04-16 | Discharge: 2020-04-16 | Disposition: A | Discharge status: Home / Self Care | Payer: BC Managed Care – PPO | Source: Ambulatory Visit | Attending: Hematology and Oncology | Admitting: Hematology and Oncology

## 2020-04-16 ENCOUNTER — Other Ambulatory Visit: Payer: Self-pay

## 2020-04-16 DIAGNOSIS — C8112 Nodular sclerosis classical Hodgkin lymphoma, intrathoracic lymph nodes: Secondary | ICD-10-CM

## 2020-04-16 DIAGNOSIS — C8190 Hodgkin lymphoma, unspecified, unspecified site: Secondary | ICD-10-CM | POA: Diagnosis not present

## 2020-04-16 LAB — GLUCOSE, CAPILLARY: Glucose-Capillary: 89 mg/dL (ref 70–99)

## 2020-04-16 MED ORDER — FLUDEOXYGLUCOSE F - 18 (FDG) INJECTION
8.8000 | Freq: Once | INTRAVENOUS | Status: AC | PRN
  Administered 2020-04-16: 8.8 via INTRAVENOUS

## 2020-04-17 ENCOUNTER — Encounter: Payer: Self-pay | Admitting: General Practice

## 2020-04-17 ENCOUNTER — Telehealth: Payer: Self-pay | Admitting: Hematology and Oncology

## 2020-04-17 NOTE — Progress Notes (Signed)
[name] Note  Phoned Martin Spencer as planned to debrief about his PET scan results. He was relieved to share his good news. When asked if he planned to celebrate, he reflected, "I think the best celebration is to go on living!" He has no other concerns at this time, but plans to reach out as needed.   [place], [place], [facility] Pager [phone] Voicemail [phone]

## 2020-04-17 NOTE — Telephone Encounter (Signed)
Called [name] to discuss the results of his recent PET CT scan with him.  The call was joined by his father.  We discussed that the remaining area of metabolic activity is most likely the thymus gland and does not represent active tumor.  This was a consensus reached by the multidisciplinary team during a prior review.  As such I am optimistic that the patient does not have active disease and therefore would be appropriate to follow him up with a PET CT scan in approximately 3 months time in order to assure that there has been no growth or progression.  [name] and his father voiced understanding of this plan moving forward.  Ledell Peoples, MD Department of Hematology/Oncology [place] at [facility] Phone: [phone] Pager: [phone] Email: [email or web address]

## 2020-04-18 ENCOUNTER — Telehealth: Payer: Self-pay | Admitting: Hematology and Oncology

## 2020-04-18 NOTE — Telephone Encounter (Signed)
Scheduled appointments per 12/23 schedule message. Patient is aware of changes.

## 2020-04-22 ENCOUNTER — Telehealth: Payer: Self-pay | Admitting: Hematology and Oncology

## 2020-04-22 NOTE — Telephone Encounter (Signed)
Released records to [facility] to [phone]

## 2020-05-10 ENCOUNTER — Ambulatory Visit: Payer: BC Managed Care – PPO | Admitting: Internal Medicine

## 2020-05-16 ENCOUNTER — Inpatient Hospital Stay: Payer: BC Managed Care – PPO | Attending: Hematology and Oncology

## 2020-05-16 ENCOUNTER — Other Ambulatory Visit: Payer: Self-pay | Admitting: Hematology and Oncology

## 2020-05-16 ENCOUNTER — Inpatient Hospital Stay: Payer: BC Managed Care – PPO

## 2020-05-16 ENCOUNTER — Other Ambulatory Visit: Payer: Self-pay

## 2020-05-16 DIAGNOSIS — C8112 Nodular sclerosis classical Hodgkin lymphoma, intrathoracic lymph nodes: Secondary | ICD-10-CM | POA: Diagnosis not present

## 2020-05-16 LAB — CMP (CANCER CENTER ONLY)
ALT: 24 U/L (ref 0–44)
AST: 24 U/L (ref 15–41)
Albumin: 4.1 g/dL (ref 3.5–5.0)
Alkaline Phosphatase: 57 U/L (ref 38–126)
Anion gap: 9 (ref 5–15)
BUN: 15 mg/dL (ref 6–20)
CO2: 25 mmol/L (ref 22–32)
Calcium: 9.2 mg/dL (ref 8.9–10.3)
Chloride: 105 mmol/L (ref 98–111)
Creatinine: 0.9 mg/dL (ref 0.61–1.24)
GFR, Estimated: >60 mL/min (ref >60)
Glucose, Bld: 89 mg/dL (ref 70–99)
Potassium: 3.8 mmol/L (ref 3.5–5.1)
Sodium: 139 mmol/L (ref 135–145)
Total Bilirubin: 1.3 mg/dL — ABNORMAL HIGH (ref 0.3–1.2)
Total Protein: 7 g/dL (ref 6.5–8.1)

## 2020-05-16 LAB — CBC WITH DIFFERENTIAL (CANCER CENTER ONLY)
Abs Immature Granulocytes: 0.01 10*3/uL (ref 0.00–0.07)
Basophils Absolute: 0 10*3/uL (ref 0.0–0.1)
Basophils Relative: 1 %
Eosinophils Absolute: 0.1 10*3/uL (ref 0.0–0.5)
Eosinophils Relative: 2 %
HCT: 42.7 % (ref 39.0–52.0)
Hemoglobin: 15 g/dL (ref 13.0–17.0)
Immature Granulocytes: 0 %
Lymphocytes Relative: 34 %
Lymphs Abs: 1.9 10*3/uL (ref 0.7–4.0)
MCH: 30.5 pg (ref 26.0–34.0)
MCHC: 35.1 g/dL (ref 30.0–36.0)
MCV: 87 fL (ref 80.0–100.0)
Monocytes Absolute: 0.7 10*3/uL (ref 0.1–1.0)
Monocytes Relative: 12 %
Neutro Abs: 2.9 10*3/uL (ref 1.7–7.7)
Neutrophils Relative %: 51 %
Platelet Count: 209 10*3/uL (ref 150–400)
RBC: 4.91 MIL/uL (ref 4.22–5.81)
RDW: 12.5 % (ref 11.5–15.5)
WBC Count: 5.5 10*3/uL (ref 4.0–10.5)
nRBC: 0 % (ref 0.0–0.2)

## 2020-05-16 LAB — LACTATE DEHYDROGENASE: LDH: 196 U/L — ABNORMAL HIGH (ref 98–192)

## 2020-05-16 MED ORDER — HEPARIN SOD (PORK) LOCK FLUSH 100 UNIT/ML IV SOLN
500.0000 [IU] | Freq: Once | INTRAVENOUS | Status: AC
  Administered 2020-05-16: 500 [IU]
  Filled 2020-05-16: qty 5

## 2020-05-16 MED ORDER — SODIUM CHLORIDE 0.9% FLUSH
10.0000 mL | Freq: Once | INTRAVENOUS | Status: AC
  Administered 2020-05-16: 10 mL
  Filled 2020-05-16: qty 10

## 2020-05-17 ENCOUNTER — Telehealth (INDEPENDENT_AMBULATORY_CARE_PROVIDER_SITE_OTHER): Payer: BC Managed Care – PPO | Admitting: Internal Medicine

## 2020-05-17 DIAGNOSIS — I319 Disease of pericardium, unspecified: Secondary | ICD-10-CM

## 2020-05-17 NOTE — Patient Instructions (Signed)
Medication Instructions:  Your physician recommends that you continue on your current medications as directed. Please refer to the Current Medication list given to you today.  *If you need a refill on your cardiac medications before your next appointment, please call your pharmacy*   Testing/Procedures: Your physician has requested that you have an echocardiogram. Echocardiography is a painless test that uses sound waves to create images of your heart. It provides your doctor with information about the size and shape of your heart and how well your hearts chambers and valves are working. This procedure takes approximately one hour. There are no restrictions for this procedure. -- [address] - [address] Floor   Follow-Up: At [facility], you and your health needs are our priority.  As part of our continuing mission to provide you with exceptional heart care, we have created designated Provider Care Teams.  These Care Teams include your primary Cardiologist (physician) and Advanced Practice Providers (APPs -  Physician Assistants and Nurse Practitioners) who all work together to provide you with the care you need, when you need it.  We recommend signing up for the patient portal called "MyChart".  Sign up information is provided on this After Visit Summary.  MyChart is used to connect with patients for Virtual Visits (Telemedicine).  Patients are able to view lab/test results, encounter notes, upcoming appointments, etc.  Non-urgent messages can be sent to your provider as well.   To learn more about what you can do with MyChart, go to [email or web address].    Your next appointment:   AS NEEDED with Dr. Margaretann Loveless

## 2020-05-17 NOTE — Progress Notes (Signed)
Virtual Visit via Video Note   This visit type was conducted due to national recommendations for restrictions regarding the COVID-19 Pandemic (e.g. social distancing) in an effort to limit this patient's exposure and mitigate transmission in our community.  Due to his co-morbid illnesses, this patient is at least at moderate risk for complications without adequate follow up.  This format is felt to be most appropriate for this patient at this time.  All issues noted in this document were discussed and addressed.  A limited physical exam was performed with this format.  Please refer to the patient's chart for his consent to telehealth for [facility].  Date:  05/17/2020   ID:  Martin Spencer, DOB 1990-12-01, MRN [id number] The patient was identified using 2 identifiers.  Patient Location: Home Provider Location: Home Office  PCP:  Patient, No Pcp Per  Cardiologist:  Elouise Munroe, MD  Electrophysiologist:  None   Evaluation Performed:  Follow-Up Visit  Chief Complaint:  Pericarditis, Hodgkins lymphoma  History of Present Illness:    Martin Spencer is a 30 y.o. male with hospitalization with probable pericarditis and incidentally found mediastinal lymphadenopathy found to have Hodgkins lymphoma.    Feels well overall. Has completed current chemotherapy  Regimen and surveillance imaging per review of onc notes. He has since moved to [place].  Has returned to exercise and is not limited in his symptoms. At times he will feel that the end of his breath is slightly more difficult, but it does not stop his activity. We discussed results of last echo which were reassuring.  Inquires if it is safe to take Adderall. We reviewed that this can cause palpitatiosn but otherwise no contraindication from cardiovascular standpoint.  The patient does not have symptoms concerning for COVID-19 infection (fever, chills, cough, or new shortness of breath).    Past Medical History:   Diagnosis Date  . ADD (attention deficit disorder)   . Cancer ([place])   . Childhood asthma   . Pericarditis   . Shortness of breath    per patient, since recent diagnoses of pericarditis, sometimes get SOB on rest   Past Surgical History:  Procedure Laterality Date  . APPENDECTOMY    . FINE NEEDLE ASPIRATION  11/30/2019   Procedure: FINE NEEDLE ASPIRATION (FNA) LINEAR;  Surgeon: Candee Furbish, MD;  Location: [facility] ENDOSCOPY;  Service: Pulmonary;;  . IR IMAGING GUIDED PORT INSERTION  12/26/2019  . LAPAROSCOPIC APPENDECTOMY N/A 11/14/2017   Procedure: APPENDECTOMY LAPAROSCOPIC;  Surgeon: Georganna Skeans, MD;  Location: [place];  Service: General;  Laterality: N/A;  . MEDIASTINOSCOPY N/A 12/08/2019   Procedure: MEDIASTINOSCOPY;  Surgeon: Melrose Nakayama, MD;  Location: [place];  Service: Thoracic;  Laterality: N/A;  . VIDEO BRONCHOSCOPY WITH ENDOBRONCHIAL ULTRASOUND N/A 11/30/2019   Procedure: VIDEO BRONCHOSCOPY WITH ENDOBRONCHIAL ULTRASOUND;  Surgeon: Candee Furbish, MD;  Location[facility]COPY;  Service: Pulmonary;  Laterality: N/A;  . WISDOM TOOTH EXTRACTION       Current Meds  Medication Sig  . acetaminophen (TYLENOL) 500 MG tablet Take 500 mg by mouth every 6 (six) hours as needed for mild pain or headache.  . amphetamine-dextroamphetamine (ADDERALL XR) 30 MG 24 hr capsule Take 30 mg by mouth daily.     Allergies:   Other   Social History   Tobacco Use  . Smoking status: Former Smoker    Types: Cigarettes  . Smokeless tobacco: Never Used  . Tobacco comment: quit in high school  Vaping Use  .  Vaping Use: Never used  Substance Use Topics  . Alcohol use: Yes    Comment: per patient "rarely"  . Drug use: Not Currently    Types: Marijuana    Comment: CBD last use 2 weeks ago ( 11/29/19)     Family Hx: The patient's family history includes Colon cancer in his paternal grandfather; Kidney cancer in his father.  ROS:   Please see the history of present illness.     All other  systems reviewed and are negative.   Prior CV studies:   The following studies were reviewed today:  Echo 11/18/19  Labs/Other Tests and Data Reviewed:    EKG:  No ECG reviewed.  Recent Labs: 11/18/2019: TSH 1.728 05/16/2020: ALT 24; BUN 15; Creatinine 0.90; Hemoglobin 15.0; Platelet Count 209; Potassium 3.8; Sodium 139   Recent Lipid Panel No results found for: CHOL, TRIG, HDL, CHOLHDL, LDLCALC, LDLDIRECT  Wt Readings from Last 3 Encounters:  04/03/20 180 lb 1.6 oz (81.7 kg)  03/20/20 182 lb 6.4 oz (82.7 kg)  03/07/20 180 lb 12.8 oz (82 kg)     Risk Assessment/Calculations:      Objective:    Vital Signs:  There were no vitals taken for this visit.   VITAL SIGNS:  reviewed GEN:  no acute distress EYES:  sclerae anicteric, EOMI - Extraocular Movements Intact RESPIRATORY:  normal respiratory effort, symmetric expansion CARDIOVASCULAR:  no peripheral edema SKIN:  no rash, lesions or ulcers. MUSCULOSKELETAL:  no obvious deformities. NEURO:  alert and oriented x 3, no obvious focal deficit PSYCH:  normal affect    ASSESSMENT & PLAN:    1. Pericarditis, unspecified chronicity, unspecified type    Unclear to what degree his initial symptoms and elevated inflammatory markers were attributable to lymphoma vs possible diagnosis of pericarditis. He did have rapid relief of chest pain with initial anti-inflammtory medications while in hospital, suggesting a possible pericardial component. No longer on antiinflammatory therapy for pericarditis, appropriately so. Given mild symptoms of chest awareness with deep inspiration, will perform echocardiogram to ensure no constrictive features. If this is normal, unlikely he will need additional cardiac imaging unless his symptoms return.     COVID-19 Education: The signs and symptoms of COVID-19 were discussed with the patient and how to seek care for testing (follow up with PCP or arrange E-visit).  The importance of social distancing  was discussed today.  Time:   Today, I have spent 10 minutes with the patient with telehealth technology discussing the above problems.     Medication Adjustments/Labs and Tests Ordered: Current medicines are reviewed at length with the patient today.  Concerns regarding medicines are outlined above.   Patient Instructions  Medication Instructions:  Your physician recommends that you continue on your current medications as directed. Please refer to the Current Medication list given to you today.  *If you need a refill on your cardiac medications before your next appointment, please call your pharmacy*   Testing/Procedures: Your physician has requested that you have an echocardiogram. Echocardiography is a painless test that uses sound waves to create images of your heart. It provides your doctor with information about the size and shape of your heart and how well your heart's chambers and valves are working. This procedure takes approximately one hour. There are no restrictions for this procedure. -- [address] - [address]rd Floor   Follow-Up: At [facility], you and your health needs are our priority.  As part of our continuing mission to  provide you with exceptional heart care, we have created designated Provider Care Teams.  These Care Teams include your primary Cardiologist (physician) and Advanced Practice Providers (APPs -  Physician Assistants and Nurse Practitioners) who all work together to provide you with the care you need, when you need it.  We recommend signing up for the patient portal called "MyChart".  Sign up information is provided on this After Visit Summary.  MyChart is used to connect with patients for Virtual Visits (Telemedicine).  Patients are able to view lab/test results, encounter notes, upcoming appointments, etc.  Non-urgent messages can be sent to your provider as well.   To learn more about what you can do with MyChart, go to [email or web address].     Your next appointment:   AS NEEDED with Dr. Margaretann Loveless       Signed, Elouise Munroe, MD  05/17/2020 9:11 AM    [place]

## 2020-06-11 ENCOUNTER — Ambulatory Visit (HOSPITAL_COMMUNITY): Payer: BC Managed Care – PPO | Attending: Cardiovascular Disease

## 2020-06-11 ENCOUNTER — Other Ambulatory Visit: Payer: Self-pay

## 2020-06-11 DIAGNOSIS — I319 Disease of pericardium, unspecified: Secondary | ICD-10-CM | POA: Diagnosis not present

## 2020-06-11 LAB — ECHOCARDIOGRAM COMPLETE
Area-P 1/2: 3.1 cm2
P 1/2 time: 638 msec
S' Lateral: 2.9 cm

## 2020-06-20 ENCOUNTER — Other Ambulatory Visit: Payer: Self-pay | Admitting: Hematology and Oncology

## 2020-06-20 ENCOUNTER — Encounter: Payer: Self-pay | Admitting: Hematology and Oncology

## 2020-06-20 MED ORDER — CITALOPRAM HYDROBROMIDE 40 MG PO TABS: 40.0000 mg | ORAL_TABLET | Freq: Every day | ORAL | 1 refills | Status: DC

## 2020-07-05 ENCOUNTER — Telehealth: Payer: Self-pay | Admitting: Hematology and Oncology

## 2020-07-05 NOTE — Telephone Encounter (Signed)
R/s 3/23 appt due to provider PAL. Called and spoke with patient. Confirmed appt

## 2020-07-09 ENCOUNTER — Ambulatory Visit (HOSPITAL_COMMUNITY): Payer: BC Managed Care – PPO

## 2020-07-17 ENCOUNTER — Other Ambulatory Visit: Payer: BC Managed Care – PPO

## 2020-07-17 ENCOUNTER — Ambulatory Visit: Payer: BC Managed Care – PPO | Admitting: Hematology and Oncology

## 2020-07-23 ENCOUNTER — Other Ambulatory Visit: Payer: Self-pay

## 2020-07-23 ENCOUNTER — Encounter (HOSPITAL_COMMUNITY)
Admission: RE | Admit: 2020-07-23 | Discharge: 2020-07-23 | Disposition: A | Discharge status: Home / Self Care | Payer: BC Managed Care – PPO | Source: Ambulatory Visit | Attending: Hematology and Oncology | Admitting: Hematology and Oncology

## 2020-07-23 DIAGNOSIS — C8112 Nodular sclerosis classical Hodgkin lymphoma, intrathoracic lymph nodes: Secondary | ICD-10-CM | POA: Diagnosis not present

## 2020-07-23 LAB — GLUCOSE, CAPILLARY: Glucose-Capillary: 103 mg/dL — ABNORMAL HIGH (ref 70–99)

## 2020-07-23 MED ORDER — FLUDEOXYGLUCOSE F - 18 (FDG) INJECTION
9.3000 | Freq: Once | INTRAVENOUS | Status: AC | PRN
  Administered 2020-07-23: 9.04 via INTRAVENOUS

## 2020-07-24 ENCOUNTER — Telehealth: Payer: Self-pay | Admitting: *Deleted

## 2020-07-24 ENCOUNTER — Inpatient Hospital Stay: Payer: BC Managed Care – PPO | Attending: Hematology and Oncology | Admitting: Hematology and Oncology

## 2020-07-24 DIAGNOSIS — C8112 Nodular sclerosis classical Hodgkin lymphoma, intrathoracic lymph nodes: Secondary | ICD-10-CM | POA: Insufficient documentation

## 2020-07-24 DIAGNOSIS — F419 Anxiety disorder, unspecified: Secondary | ICD-10-CM | POA: Insufficient documentation

## 2020-07-24 DIAGNOSIS — R590 Localized enlarged lymph nodes: Secondary | ICD-10-CM | POA: Insufficient documentation

## 2020-07-24 DIAGNOSIS — Z95828 Presence of other vascular implants and grafts: Secondary | ICD-10-CM

## 2020-07-24 NOTE — Telephone Encounter (Signed)
TCT patient regarding setting up an appt for today for Dr. Lorenso Courier to review yesterday's PET scan.  Pt states he read the results  He iis very tearful and afraid.  Emotional support extended. Offered 4pm appt today. He is agreeable to come in at that time. Pt is currently living in [place] and states he will be ok to make the drive.  Dr. Lorenso Courier aware. High priority scheduling message sent

## 2020-07-25 ENCOUNTER — Encounter: Payer: Self-pay | Admitting: Hematology and Oncology

## 2020-07-25 ENCOUNTER — Inpatient Hospital Stay: Payer: BC Managed Care – PPO

## 2020-07-25 ENCOUNTER — Other Ambulatory Visit: Payer: Self-pay

## 2020-07-25 ENCOUNTER — Inpatient Hospital Stay: Payer: BC Managed Care – PPO | Admitting: Hematology and Oncology

## 2020-07-25 DIAGNOSIS — C8112 Nodular sclerosis classical Hodgkin lymphoma, intrathoracic lymph nodes: Secondary | ICD-10-CM | POA: Diagnosis not present

## 2020-07-25 LAB — CBC WITH DIFFERENTIAL (CANCER CENTER ONLY)
Abs Immature Granulocytes: 0.02 10*3/uL (ref 0.00–0.07)
Basophils Absolute: 0 10*3/uL (ref 0.0–0.1)
Basophils Relative: 1 %
Eosinophils Absolute: 0.1 10*3/uL (ref 0.0–0.5)
Eosinophils Relative: 1 %
HCT: 44.7 % (ref 39.0–52.0)
Hemoglobin: 15.4 g/dL (ref 13.0–17.0)
Immature Granulocytes: 0 %
Lymphocytes Relative: 28 %
Lymphs Abs: 1.8 10*3/uL (ref 0.7–4.0)
MCH: 29.5 pg (ref 26.0–34.0)
MCHC: 34.5 g/dL (ref 30.0–36.0)
MCV: 85.6 fL (ref 80.0–100.0)
Monocytes Absolute: 0.7 10*3/uL (ref 0.1–1.0)
Monocytes Relative: 10 %
Neutro Abs: 3.9 10*3/uL (ref 1.7–7.7)
Neutrophils Relative %: 60 %
Platelet Count: 216 10*3/uL (ref 150–400)
RBC: 5.22 MIL/uL (ref 4.22–5.81)
RDW: 12.1 % (ref 11.5–15.5)
WBC Count: 6.5 10*3/uL (ref 4.0–10.5)
nRBC: 0 % (ref 0.0–0.2)

## 2020-07-25 LAB — CMP (CANCER CENTER ONLY)
ALT: 42 U/L (ref 0–44)
AST: 23 U/L (ref 15–41)
Albumin: 4.2 g/dL (ref 3.5–5.0)
Alkaline Phosphatase: 64 U/L (ref 38–126)
Anion gap: 10 (ref 5–15)
BUN: 16 mg/dL (ref 6–20)
CO2: 28 mmol/L (ref 22–32)
Calcium: 9.2 mg/dL (ref 8.9–10.3)
Chloride: 104 mmol/L (ref 98–111)
Creatinine: 0.86 mg/dL (ref 0.61–1.24)
GFR, Estimated: >60 mL/min (ref >60)
Glucose, Bld: 96 mg/dL (ref 70–99)
Potassium: 3.8 mmol/L (ref 3.5–5.1)
Sodium: 142 mmol/L (ref 135–145)
Total Bilirubin: 1.1 mg/dL (ref 0.3–1.2)
Total Protein: 7.3 g/dL (ref 6.5–8.1)

## 2020-07-25 LAB — LACTATE DEHYDROGENASE: LDH: 167 U/L (ref 98–192)

## 2020-07-25 MED ORDER — HEPARIN SOD (PORK) LOCK FLUSH 100 UNIT/ML IV SOLN
500.0000 [IU] | Freq: Once | INTRAVENOUS | Status: AC
  Administered 2020-07-25: 500 [IU]
  Filled 2020-07-25: qty 5

## 2020-07-25 MED ORDER — SODIUM CHLORIDE 0.9% FLUSH
10.0000 mL | Freq: Once | INTRAVENOUS | Status: AC
  Administered 2020-07-25: 10 mL
  Filled 2020-07-25: qty 10

## 2020-07-25 NOTE — Progress Notes (Signed)
[place] [phone]   [phone]  PROGRESS NOTE  Patient Care Team: Patient, No Pcp Per (Inactive) as PCP - General ([place]) Elouise Munroe, MD as PCP - Cardiology (Cardiology)  Hematological/Oncological History # Classical Hodgkin Lymphoma, Stage I.  Favorable Disease 1) 11/17/2019: presented to the emergency department with chest pain. CXR performed showed right paratracheal density. CT recommended. CT angiogram showed diffuse enlargement of the thymus gland with associated mild mediastinal adenopathy including right paratracheal enlarged lymph node corresponding to radiographic abnormality 2) 11/19/2019: CT abdomen performed showing subtle mass versus steatosis within the caudate lobe of liver; due to history of thoracic adenopathy and thymic infiltration, follow-up non emergent MR imaging of the liver with and without contrast recommended to exclude hepatic mass 3) 11/24/2019: PET CT scan performed showing hypermetabolic mediastinal adenopathy, consistent with lymphoma. (Deauville 5). No extrathoracic disease 4) 11/30/2019: EBUS performed, failed to provide adequate tissue for diagnosis 5) 12/08/2019: mediastinoscopy performed, biopsy confirmed Hodgkin's lymphoma.  6) 12/28/2019 Cycle 1 Day 1 of ABVD Chemotherapy  7) 01/10/2020: Cycle 1 Day 15 of ABVD Chemotherapy  8) 01/23/2020: Cycle 2 Day 1 of ABVD Chemotherapy  9) 02/07/2020: Cycle 2 Day 15 of ABVD Chemotherapy  10) 02/19/2020: PET CT scan showed decrease in size and degree of FDG uptake associated with previously noted FDG avid mediastinal adenopathy with SUV max of 5.19. This is compatible with Deauville criteria 4. 11) 02/23/2020: Cycle 3 Day 1 of ABVD Chemotherapy 12) 03/07/2020: Cycle 3 Day 15 of ABVD Chemotherapy 13) 03/20/2020: Cycle 4 Day 1 of ABVD Chemotherapy 14) 04/03/2020:  Cycle 4 Day 15 of ABVD Chemotherapy 15) 07/23/2020: PET CT scan shows interval enlargement and significant  increase in hypermetabolic activity of an anterior mediastinal mass concerning for lymphoma recurrence ( Deauville 5).  Interval History:  Martin Spencer 30 y.o. male with medical history significant for classical Hodgkin Lymphoma who presents for a follow up visit. The patient was last seen on 04/03/2020 after he completed treatment. He presents today prior to gust results of a PET CT scan from 07/23/2020.   On exam today Martin Spencer is accompanied by his father, mother, and dog named "Chicken".  He is extraordinarily anxious and reports that he feels unwell due to the strain of the results of the last PET CT scan.  He denies any fevers, chills, sweats, vomiting, or diarrhea.  A full 10 point ROS is listed below.  The bulk of our discussion focused on the results of the PET CT scan and the possible implications.  We discussed the need for biopsy in order to confirm this is relapse/recurrence and to assure that there is no other disease process at play.  I also noted that we need to consider having the patient transferred to a tertiary care center for continued treatment for consideration of conditioning chemotherapy and possible bone marrow transplant.  MEDICAL HISTORY:  Past Medical History:  Diagnosis Date  . ADD (attention deficit disorder)   . Cancer ([place])   . Childhood asthma   . Pericarditis   . Shortness of breath    per patient, since recent diagnoses of pericarditis, sometimes get SOB on rest   SURGICAL HISTORY: Past Surgical History:  Procedure Laterality Date  . APPENDECTOMY    . FINE NEEDLE ASPIRATION  11/30/2019   Procedure: FINE NEEDLE ASPIRATION (FNA) LINEAR;  Surgeon: Candee Furbish, MD;  Location: [facility] ENDOSCOPY;  Service: Pulmonary;;  . IR IMAGING GUIDED PORT INSERTION  12/26/2019  .  LAPAROSCOPIC APPENDECTOMY N/A 11/14/2017   Procedure: APPENDECTOMY LAPAROSCOPIC;  Surgeon: Georganna Skeans, MD;  Location: [place];  Service: General;  Laterality: N/A;  . MEDIASTINOSCOPY N/A  12/08/2019   Procedure: MEDIASTINOSCOPY;  Surgeon: Melrose Nakayama, MD;  Location: [place];  Service: Thoracic;  Laterality: N/A;  . VIDEO BRONCHOSCOPY WITH ENDOBRONCHIAL ULTRASOUND N/A 11/30/2019   Procedure: VIDEO BRONCHOSCOPY WITH ENDOBRONCHIAL ULTRASOUND;  Surgeon: Candee Furbish, MD;  Location: [facility] ENDOSCOPY;  Service: Pulmonary;  Laterality: N/A;  . WISDOM TOOTH EXTRACTION      SOCIAL HISTORY: Social History   Socioeconomic History  . Marital status: Single    Spouse name: Not on file  . Number of children: Not on file  . Years of education: Not on file  . Highest education level: Not on file  Occupational History  . Not on file  Tobacco Use  . Smoking status: Former Smoker    Types: Cigarettes  . Smokeless tobacco: Never Used  . Tobacco comment: quit in high school  Vaping Use  . Vaping Use: Never used  Substance and Sexual Activity  . Alcohol use: Yes    Comment: per patient "rarely"  . Drug use: Not Currently    Types: Marijuana    Comment: CBD last use 2 weeks ago ( 11/29/19)  . Sexual activity: Not on file  Other Topics Concern  . Not on file  Social History Narrative  . Not on file   Social Determinants of Health   Financial Resource Strain: Low Risk   . Difficulty of Paying Living Expenses: Not hard at all  Food Insecurity: No Food Insecurity  . Worried About Charity fundraiser in the Last Year: Never true  . Ran Out of Food in the Last Year: Never true  Transportation Needs: No Transportation Needs  . Lack of Transportation (Medical): No  . Lack of Transportation (Non-Medical): No  Physical Activity: Not on file  Stress: Stress Concern Present  . Feeling of Stress : Rather much  Social Connections: Unknown  . Frequency of Communication with Friends and Family: More than three times a week  . Frequency of Social Gatherings with Friends and Family: More than three times a week  . Attends Religious Services: Not on file  . Active Member of Clubs or  Organizations: Not on file  . Attends Archivist Meetings: Not on file  . Marital Status: Never married  Human resources officer Violence: Not on file    FAMILY HISTORY: Family History  Problem Relation Age of Onset  . Kidney cancer Father   . Colon cancer Paternal Grandfather     ALLERGIES:  is allergic to other.  MEDICATIONS:  Current Outpatient Medications  Medication Sig Dispense Refill  . acetaminophen (TYLENOL) 500 MG tablet Take 500 mg by mouth every 6 (six) hours as needed for mild pain or headache.    . amphetamine-dextroamphetamine (ADDERALL XR) 30 MG 24 hr capsule Take 30 mg by mouth daily.    . citalopram (CELEXA) 40 MG tablet Take 1 tablet (40 mg total) by mouth daily. 90 tablet 1  . lidocaine-prilocaine (EMLA) cream Apply 1 application topically as needed. (Patient taking differently: Apply 1 application topically as needed (port).) 30 g 1   No current facility-administered medications for this visit.    REVIEW OF SYSTEMS:   Constitutional: ( - ) fevers, ( - )  chills , ( - ) night sweats Eyes: ( - ) blurriness of vision, ( - ) double vision, ( - )  watery eyes Ears, nose, mouth, throat, and face: ( - ) mucositis, ( - ) sore throat Respiratory: ( - ) cough, ( - ) dyspnea, ( - ) wheezes Cardiovascular: ( - ) palpitation, ( - ) chest discomfort, ( - ) lower extremity swelling Gastrointestinal:  ( - ) nausea, ( - ) heartburn, ( - ) change in bowel habits Skin: ( - ) abnormal skin rashes Lymphatics: ( - ) new lymphadenopathy, ( - ) easy bruising Neurological: ( - ) numbness, ( - ) tingling, ( - ) new weaknesses Behavioral/Psych: ( - ) mood change, ( - ) new changes  All other systems were reviewed with the patient and are negative.  PHYSICAL EXAMINATION: ECOG PERFORMANCE STATUS: 0 - Asymptomatic  There were no vitals filed for this visit. There were no vitals filed for this visit.  GENERAL: well appearing fit young Caucasian male. alert, no distress and  comfortable SKIN: skin color, texture, turgor are normal, no rashes or significant lesions. Small surgical incision at base of neck, well healed.  EYES: conjunctiva are pink and non-injected, sclera clear LUNGS: clear to auscultation and percussion with normal breathing effort HEART: regular rate & rhythm and no murmurs and no lower extremity edema Musculoskeletal: no cyanosis of digits and no clubbing  PSYCH: alert & oriented x 3, fluent speech NEURO: no focal motor/sensory deficits  LABORATORY DATA:  I have reviewed the data as listed CBC Latest Ref Rng & Units 05/16/2020 04/03/2020 03/20/2020  WBC 4.0 - 10.5 K/uL 5.5 2.5(L) 2.5(L)  Hemoglobin 13.0 - 17.0 g/dL 15.0 13.9 13.4  Hematocrit 39.0 - 52.0 % 42.7 39.7 37.6(L)  Platelets 150 - 400 K/uL 209 240 227    CMP Latest Ref Rng & Units 05/16/2020 04/03/2020 03/20/2020  Glucose 70 - 99 mg/dL 89 90 94  BUN 6 - 20 mg/dL 15 12 16   Creatinine 0.61 - 1.24 mg/dL 0.90 0.94 0.95  Sodium 135 - 145 mmol/L 139 140 139  Potassium 3.5 - 5.1 mmol/L 3.8 4.2 4.0  Chloride 98 - 111 mmol/L 105 106 107  CO2 22 - 32 mmol/L 25 25 25   Calcium 8.9 - 10.3 mg/dL 9.2 9.7 9.1  Total Protein 6.5 - 8.1 g/dL 7.0 7.4 6.8  Total Bilirubin 0.3 - 1.2 mg/dL 1.3(H) 1.1 0.9  Alkaline Phos 38 - 126 U/L 57 49 48  AST 15 - 41 U/L 24 24 17   ALT 0 - 44 U/L 24 20 16     RADIOGRAPHIC STUDIES: I have personally review the images and agree with the findings below: enlarging FDG avid area in the mediastinum, deeply concerning for recurrence.   NM PET Image Restag (PS) Skull Base To Thigh  Result Date: 07/23/2020 CLINICAL DATA:  Subsequent treatment strategy for lymphoma. Hodgkin's lymphoma. EXAM: NUCLEAR MEDICINE PET SKULL BASE TO THIGH TECHNIQUE: 9.0 mCi F-18 FDG was injected intravenously. Full-ring PET imaging was performed from the skull base to thigh after the radiotracer. CT data was obtained and used for attenuation correction and anatomic localization. Fasting blood  glucose: 103 mg/dl COMPARISON:  PET-CT 04/16/2020 FINDINGS: Mediastinal blood pool activity: SUV max 2.0 Liver activity: SUV max 3.3 NECK: Symmetric metabolic activity in the posterior oropharynx and hypopharynx similar to prior. No hypermetabolic lymph nodes in the neck. Incidental CT findings: none CHEST: Within the anterior mediastinum, soft tissue mass is increased in size and metabolic activity with SUV max equal 18 compared SUV max equal 8.1. Lesion measures 3.1 x 2.9 cm (image 69/4) increased from 2.5 x  2.3 cm. No additional hypermetabolic mediastinal hilar lymph nodes. Incidental CT findings: No suspicious pulmonary nodules. Normal pleural. Airways normal. ABDOMEN/PELVIS: Spleen is normal size and normal metabolic activity. No enlarged or hypermetabolic abdominopelvic lymph nodes. Incidental CT findings: None SKELETON: No focal hypermetabolic activity to suggest skeletal metastasis. Incidental CT findings: none IMPRESSION: 1. Unfortunately, there is interval enlargement and significant increase in hypermetabolic activity of an anterior mediastinal mass concerning for lymphoma recurrence ( Deauville 5). 2. No evidence of adenopathy elsewhere on the FDG PET scan. 3. Normal spleen and bone marrow. These results will be called to the ordering clinician or representative by the Radiologist Assistant, and communication documented in the PACS or [employer]. Electronically Signed   By: Suzy Bouchard M.D.   On: 07/23/2020 16:17    ASSESSMENT & PLAN Martin Spencer 30 y.o. male with medical history significant for classical Hodgkin Lymphoma stage I who presents for a follow up visit. Today he presents discussed the results of his PET CT scan from 07/24/2018.  Previously we discussed the patient has a stage I classical Hodgkin's lymphoma, favorable disease.  He is young and otherwise fit and therefore would be able to tolerate full ABVD chemotherapy.  We discussed expected side effects including nausea,  vomiting, constipation, nerve damage, fertility loss, hair loss, and possible secondary malignancy.  The patient and his parents voiced their understanding of the reasoning behind obtaining PFTs, chemotherapy education, port placement, and fertility preservation at [facility].  They have the opportunity to ask all questions and concerns regarding treatment moving forward.  The regimen of choice for this stage I classical Hodgkin's lymphoma, favorable disease would consist of ABVD chemotherapy.  ABVD is comprised of doxorubicin 25 mg/m IV, bleomycin 10 units/m IV, vinblastine 6 g/m IV, and dacarbazine 375 mg/m IV all to be administered on days 1 and 15 of a 28-day cycle.  The patient completed 2 cycles of this and unfortunately due to a Deauville Score of 4 we would recommend an additional 2 cycles of ABVD. After 2 more cycles the patient is to undergo a PET CT scan at which time treatment moving forward can be dictated by response to therapy.  Unfortunately the PET CT scan on 07/23/2020 shows interval enlargement and significant increase in hypermetabolic activity of an anterior mediastinal mass concerning for lymphoma recurrence ( Deauville 5).  If these findings I would recommend connecting with cardiothoracic surgery for consideration of a repeat biopsy.  The patient currently lives in [place] and would be interested in establishing with [place] for further care closer to his current location.  The patient is family would like time to consider which institution he would like to receive further treatment.  I would recommend that he consider getting treatment at transplant center therefore they could perform conditioning chemotherapy and immediately transition into bone marrow transplant if required.  # Classical Hodgkin Lymphoma, Stage I.  Favorable Disease.  --Patient completed 4 cycles of ABVD with interval PET CT scan.  --patient has completed PFTs, Port, and  fertility preservation. TTE performed on 11/18/2019 (EF 65%)  --there was noted to be a residual Deauville 4 lesion at the site of the thymus gland. This was thought to be a normal physiological finding after review by [facility] conference. Additionally NCCN makes reference to FDG avid thymus glands in young individuals receiving treatment for mediastinal ABVD.  --unfortunately on PET CT scan from today there is clear evidence of an enlarging Deauville 5 lesion. --Will  discuss again at [facility] and Dr. Roxan Hockey. In the interim will begin arrangements for patient to receive care at [facility] and   #Neutropenia, mild -resolved following treatment  #Anxiety --currently taking citalopram 20mg  PO daily --patient asks for benzodiazepines for scans, treatment, and for anxiety. I do not recommend benzos for this indication.  --continue discussions with social work --continue to monitor    #Thrombocytopenia, resolved --Plt rebounded to normal levels  --continue to monitor  # Symptom Management -- prescribed zofran 8mg  PO q8H PRN and compazine 10mg  q6H PRN.  --EMLA cream for port -- assure daily BM while on therapy with vincristine. Assure patient has senna docusate and miralax on hand during treatment.  --encourage claratin/ibuprofen with GCSF shot. GCSF held since Cycle 2 Day 15 --strict return precautions for shortness of breath, cough, fevers.   No orders of the defined types were placed in this encounter.  All questions were answered. The patient knows to call the clinic with any problems, questions or concerns.  A total of more than 30 minutes were spent on this encounter and over half of that time was spent on counseling and coordination of care as outlined above.   Ledell Peoples, MD Department of Hematology/Oncology [place] at [facility] Phone: [phone] Pager: [phone] Email: [email or web address]  07/25/2020 9:18 AM

## 2020-07-26 ENCOUNTER — Telehealth: Payer: Self-pay | Admitting: Hematology and Oncology

## 2020-07-26 ENCOUNTER — Telehealth: Payer: Self-pay

## 2020-07-26 NOTE — Telephone Encounter (Signed)
Release: [id number] Faxed records to [facility] per referral request @ fax [phone]

## 2020-07-26 NOTE — Telephone Encounter (Signed)
Schedule per los. Called and spoke with patient. Gave Dr. Lorenso Courier patient PCP info per patient request to contact.

## 2020-07-26 NOTE — Telephone Encounter (Signed)
I have faxed over Martin Spencer's patient diagnosis, demographics and insurance information, recent office notes with treatment history, and all radiology/pathology reports from the past two years to [facility] with receipt of confirmation per Dr. Lorenso Courier.  I also contacted Tedra Coupe in Radiology to request that she send a disc with all radiology images from the past two years. I confirmed the mailing address with Tedra Coupe:   [facility]: [place] [address] [facility]. [place], [place] [address]

## 2020-07-29 ENCOUNTER — Other Ambulatory Visit: Payer: Self-pay | Admitting: *Deleted

## 2020-07-29 DIAGNOSIS — C8112 Nodular sclerosis classical Hodgkin lymphoma, intrathoracic lymph nodes: Secondary | ICD-10-CM

## 2020-07-29 NOTE — Progress Notes (Signed)
mbuatory

## 2020-07-31 ENCOUNTER — Telehealth: Payer: Self-pay | Admitting: *Deleted

## 2020-07-31 NOTE — Telephone Encounter (Signed)
Medical Records faxed to [facility]/ Dr Janice Norrie.  [phone]

## 2020-07-31 NOTE — Telephone Encounter (Signed)
Received call back from [place]. Spoke with him and he states the appt went well @ [facility]. Essentially, Martin Spencer states they agree with Dr. Libby Maw assessment and that he needs to have another biopsy done. Pt states he has a consult with a Dr. Glo Herring  Tomorrow regarding doing another mediastinal biopsy.  Martin Spencer states he does not plan to call Dr. Roxan Hockey do re-do the biopsy.  Martin Spencer states he will let us know how it goes to morrow.

## 2020-07-31 NOTE — Telephone Encounter (Signed)
TCT patient to inquire about his appt @ [facility].  No answer but was able to leave vm message for pt to call back at his convenience.   Referral information has been fax'd to [facility] at his request.

## 2020-08-21 ENCOUNTER — Inpatient Hospital Stay: Payer: BC Managed Care – PPO

## 2020-08-21 ENCOUNTER — Telehealth: Payer: Self-pay | Admitting: *Deleted

## 2020-08-21 ENCOUNTER — Inpatient Hospital Stay: Payer: BC Managed Care – PPO | Admitting: Hematology and Oncology

## 2020-08-21 NOTE — Telephone Encounter (Signed)
TCT patient to advise that Dr. Lorenso Courier is out of the office today and we need to re-schedule his appt.  Spoke with pt and he advised he was discharged from [facility] yesterday. Informed pt that we needed to cancel his appt with Dr. Lorenso Courier today as Dr. Lorenso Courier is out of the office. Pt ok with that and will call us when he wants to be seen . He plans to go to [facility] in [place], [place] for second/third opinion

## 2020-08-29 ENCOUNTER — Encounter: Payer: Self-pay | Admitting: General Practice

## 2020-08-29 NOTE — Progress Notes (Signed)
[place] CSW Progress Notes  Call to patient, he is struggling w new diagnosis.  He left CSW a VM w request to call due to anxiety.  He states that his anxiety level has decreased since that time.  He finished chemotherapy at [facility], was hoping to be found in remission but PET scan revealed disease progression and possible different type of cancer per patient.  He recently underwent a difficulty biopsy procedure and spent over two weeks inpatient recovering. He is somewhat encouraged by the possibility that his cancer is properly identified and "that might be why the first treatment didn't work."  He will be following up at [facility] on 5.12 with a new oncologist.  He will have more understanding of his treatment plan at that time.  He has moved back to [place] to live w his father who is a good support for him.  He lives w father and his two dogs.  He continues to work.  Patient would like to reestablish with counseling on a regular basis. As CSW can only provide short term and time limited counseling, and patient's situation appears to be more a long term support needs, CSW will find appropriate referrals for patient.  He may also have resources available through [facility].  CSW will call him on 5.13 after his initial appointment at [facility] to collaborate on course of action to provide him with the support he needs.  Edwyna Shell, LCSW Clinical Social Worker Phone:  [phone]

## 2020-09-06 ENCOUNTER — Inpatient Hospital Stay: Payer: BC Managed Care – PPO | Attending: Hematology and Oncology | Admitting: General Practice

## 2020-09-06 DIAGNOSIS — C8112 Nodular sclerosis classical Hodgkin lymphoma, intrathoracic lymph nodes: Secondary | ICD-10-CM

## 2020-09-06 NOTE — Progress Notes (Signed)
[place] CSW Progress Notes  Call to patient for support.  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  In some ways, he is hopeful about the new diagnosis and updated treatment as it gives the opportunity for a fresh start with interventions targeted to his new diagnosis.  He identified several values as sustainers during treatment:  doing well at work, having a social life and spending time w his supportive family.  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  Worked on anticipatory planning so that his main value, work, is as minimally impacted as possible from chemotherapy treatments.  He remembers "chemo fog" and difficulty keeping track of things during previous treatment.  Discussed ways to write down tasks, enlist the help of others, inform his clients of his situation and enlist their support.  He will call CSW as needed for support/resources.  Edwyna Shell, LCSW Clinical Social Worker Phone:  [phone]

## 2020-10-08 ENCOUNTER — Other Ambulatory Visit: Payer: Self-pay | Admitting: Hematology and Oncology

## 2020-10-09 ENCOUNTER — Other Ambulatory Visit: Payer: Self-pay

## 2020-10-09 ENCOUNTER — Telehealth: Payer: Self-pay | Admitting: Hematology and Oncology

## 2020-10-09 DIAGNOSIS — C8112 Nodular sclerosis classical Hodgkin lymphoma, intrathoracic lymph nodes: Secondary | ICD-10-CM

## 2020-10-09 NOTE — Telephone Encounter (Signed)
Scheduled per los.. called and spoke with patients mother. Confirmed appts

## 2020-10-10 NOTE — Progress Notes (Signed)
The following biosimilar Udenyca ([id number]) has been selected for use in this patient per insurance.  Henreitta Leber, PharmD 10/10/20 @ [phone]

## 2020-10-11 ENCOUNTER — Telehealth: Payer: Self-pay | Admitting: Hematology and Oncology

## 2020-10-11 NOTE — Telephone Encounter (Signed)
Rescheduled upcoming appointment per 6/17 schedule message. Patient is aware of changes.

## 2020-10-12 ENCOUNTER — Inpatient Hospital Stay: Payer: BC Managed Care – PPO

## 2020-10-14 ENCOUNTER — Inpatient Hospital Stay: Payer: BC Managed Care – PPO

## 2020-10-14 ENCOUNTER — Inpatient Hospital Stay: Payer: BC Managed Care – PPO | Attending: Hematology and Oncology | Admitting: Hematology and Oncology

## 2020-10-14 ENCOUNTER — Other Ambulatory Visit: Payer: Self-pay

## 2020-10-14 ENCOUNTER — Encounter: Payer: Self-pay | Admitting: Hematology and Oncology

## 2020-10-14 ENCOUNTER — Ambulatory Visit: Payer: BC Managed Care – PPO

## 2020-10-14 VITALS — BP 120/75 | HR 70 | Temp 97.0°F | Resp 20 | Wt 188.0 lb

## 2020-10-14 DIAGNOSIS — C8112 Nodular sclerosis classical Hodgkin lymphoma, intrathoracic lymph nodes: Secondary | ICD-10-CM

## 2020-10-14 DIAGNOSIS — R590 Localized enlarged lymph nodes: Secondary | ICD-10-CM | POA: Diagnosis not present

## 2020-10-14 DIAGNOSIS — Z5189 Encounter for other specified aftercare: Secondary | ICD-10-CM | POA: Diagnosis not present

## 2020-10-14 DIAGNOSIS — C8522 Mediastinal (thymic) large B-cell lymphoma, intrathoracic lymph nodes: Secondary | ICD-10-CM | POA: Diagnosis not present

## 2020-10-14 LAB — CBC WITH DIFFERENTIAL (CANCER CENTER ONLY)
Abs Immature Granulocytes: 0.03 10*3/uL (ref 0.00–0.07)
Basophils Absolute: 0 10*3/uL (ref 0.0–0.1)
Basophils Relative: 0 %
Eosinophils Absolute: 0.1 10*3/uL (ref 0.0–0.5)
Eosinophils Relative: 2 %
HCT: 39.1 % (ref 39.0–52.0)
Hemoglobin: 14.1 g/dL (ref 13.0–17.0)
Immature Granulocytes: 1 %
Lymphocytes Relative: 19 %
Lymphs Abs: 1.1 10*3/uL (ref 0.7–4.0)
MCH: 30.4 pg (ref 26.0–34.0)
MCHC: 36.1 g/dL — ABNORMAL HIGH (ref 30.0–36.0)
MCV: 84.3 fL (ref 80.0–100.0)
Monocytes Absolute: 0.1 10*3/uL (ref 0.1–1.0)
Monocytes Relative: 1 %
Neutro Abs: 4.5 10*3/uL (ref 1.7–7.7)
Neutrophils Relative %: 77 %
Platelet Count: 232 10*3/uL (ref 150–400)
RBC: 4.64 MIL/uL (ref 4.22–5.81)
RDW: 11.9 % (ref 11.5–15.5)
WBC Count: 5.8 10*3/uL (ref 4.0–10.5)
nRBC: 0 % (ref 0.0–0.2)

## 2020-10-14 LAB — CMP (CANCER CENTER ONLY)
ALT: 30 U/L (ref 0–44)
AST: 18 U/L (ref 15–41)
Albumin: 4.2 g/dL (ref 3.5–5.0)
Alkaline Phosphatase: 61 U/L (ref 38–126)
Anion gap: 6 (ref 5–15)
BUN: 24 mg/dL — ABNORMAL HIGH (ref 6–20)
CO2: 33 mmol/L — ABNORMAL HIGH (ref 22–32)
Calcium: 9.3 mg/dL (ref 8.9–10.3)
Chloride: 100 mmol/L (ref 98–111)
Creatinine: 1.03 mg/dL (ref 0.61–1.24)
GFR, Estimated: >60 mL/min (ref >60)
Glucose, Bld: 94 mg/dL (ref 70–99)
Potassium: 4 mmol/L (ref 3.5–5.1)
Sodium: 139 mmol/L (ref 135–145)
Total Bilirubin: 1.2 mg/dL (ref 0.3–1.2)
Total Protein: 6.9 g/dL (ref 6.5–8.1)

## 2020-10-14 LAB — MAGNESIUM: Magnesium: 2.3 mg/dL (ref 1.7–2.4)

## 2020-10-14 LAB — LACTATE DEHYDROGENASE: LDH: 123 U/L (ref 98–192)

## 2020-10-14 MED ORDER — PEGFILGRASTIM-CBQV 6 MG/0.6ML ~~LOC~~ SOSY
6.0000 mg | PREFILLED_SYRINGE | Freq: Once | SUBCUTANEOUS | Status: AC
  Administered 2020-10-14: 6 mg via SUBCUTANEOUS

## 2020-10-14 MED ORDER — PEGFILGRASTIM-CBQV 6 MG/0.6ML ~~LOC~~ SOSY
PREFILLED_SYRINGE | SUBCUTANEOUS | Status: AC
  Filled 2020-10-14: qty 0.6

## 2020-10-14 NOTE — Progress Notes (Signed)
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 CXR performed showed right paratracheal density. CT recommended. CT angiogram showed diffuse enlargement of the thymus gland with associated mild mediastinal adenopathy including right paratracheal enlarged lymph node corresponding to radiographic abnormality 2) 11/19/2019: CT abdomen performed showing subtle mass versus steatosis within the caudate lobe of liver; due to history of thoracic adenopathy and thymic infiltration, follow-up non emergent MR imaging of the liver with and without contrast recommended to exclude hepatic mass 3) 11/24/2019: PET CT scan performed showing hypermetabolic mediastinal adenopathy, consistent with lymphoma. (Deauville 5). No extrathoracic disease 4) 11/30/2019: EBUS performed, failed to provide adequate tissue for diagnosis 5) 12/08/2019: mediastinoscopy performed, biopsy confirmed Hodgkin's lymphoma.  6) 12/28/2019 Cycle 1 Day 1 of ABVD Chemotherapy  7) 01/10/2020: Cycle 1 Day 15 of ABVD Chemotherapy  8) 01/23/2020: Cycle 2 Day 1 of ABVD Chemotherapy  9) 02/07/2020: Cycle 2 Day 15 of ABVD Chemotherapy  10) 02/19/2020: PET CT scan showed decrease in size and degree of FDG uptake associated with previously noted FDG avid mediastinal adenopathy with SUV max of 5.19. This is compatible with Deauville criteria 4. 11) 02/23/2020: Cycle 3 Day 1 of ABVD Chemotherapy 12) 03/07/2020: Cycle 3 Day 15 of ABVD Chemotherapy 13) 03/20/2020: Cycle 4 Day 1 of ABVD Chemotherapy 14) 04/03/2020:  Cycle 4 Day 15 of ABVD Chemotherapy 15)  07/23/2020: PET CT scan shows interval enlargement and significant increase in hypermetabolic activity of an anterior mediastinal mass concerning for lymphoma recurrence ( Deauville 5). 16) 08/07/2020: Mediastinoscopy to obtain new tissue. Findings show primary mediastinal B-cell lymphoma or a "gray zone lymphoma". 17) 10/07/2020: Cycle 1 Day 1 of R-EPOCH at [facility]  Interval History:  Martin Spencer 30 y.o. male with medical history significant for primary mediastinal B cell lymphoma who presents for a follow up visit. The patient was last seen on 07/24/2020 to review his PET CT scan from 07/23/2020.  In the interim since last visit Martin Spencer has established with [facility] where he has begun treatment with R-EPOCH for primary mediastinal B-cell lymphoma.  On exam today Martin Spencer is accompanied by his mother.  He reports that he is doing quite well and that the chemotherapy he received at [facility] is a "breeze".  He notes that his body feels fine but his mind is foggy and he does have occasional trouble with sleeping.  Otherwise he has been able to continue exercising and has no major questions concerns or complaints today.  He denies any fevers, chills, sweats, vomiting, or diarrhea.  A full 10 point ROS is listed below.  The bulk of our discussion focused on comanaged care and our role in his treatment moving forward.  At this time we are offering supportive care with G-CSF support and labs as well as transfusion if necessary.  Martin Spencer voiced his understanding of this plan moving forward.  MEDICAL HISTORY:  Past Medical History:  Diagnosis Date   ADD (attention deficit disorder)    Cancer ([place])    Childhood asthma    Pericarditis    Shortness of breath    per patient, since  recent diagnoses of pericarditis, sometimes get SOB on rest   SURGICAL HISTORY: Past Surgical History:  Procedure Laterality Date   APPENDECTOMY     FINE NEEDLE  ASPIRATION  11/30/2019   Procedure: FINE NEEDLE ASPIRATION (FNA) LINEAR;  Surgeon: Candee Furbish, MD;  Location: [place];  Service: Pulmonary;;   IR IMAGING GUIDED PORT INSERTION  12/26/2019   LAPAROSCOPIC APPENDECTOMY N/A 11/14/2017   Procedure: APPENDECTOMY LAPAROSCOPIC;  Surgeon: Georganna Skeans, MD;  Location: [place];  Service: General;  Laterality: N/A;   MEDIASTINOSCOPY N/A 12/08/2019   Procedure: MEDIASTINOSCOPY;  Surgeon: Melrose Nakayama, MD;  Location: [place];  Service: Thoracic;  Laterality: N/A;   VIDEO BRONCHOSCOPY WITH ENDOBRONCHIAL ULTRASOUND N/A 11/30/2019   Procedure: VIDEO BRONCHOSCOPY WITH ENDOBRONCHIAL ULTRASOUND;  Surgeon: Candee Furbish, MD;  Location: [facility];  Service: Pulmonary;  Laterality: N/A;   WISDOM TOOTH EXTRACTION      SOCIAL HISTORY: Social History   Socioeconomic History   Marital status: Single    Spouse name: Not on file   Number of children: Not on file   Years of education: Not on file   Highest education level: Not on file  Occupational History   Not on file  Tobacco Use   Smoking status: Former    Pack years: 0.00    Types: Cigarettes   Smokeless tobacco: Never   Tobacco comments:    quit in high school  Vaping Use   Vaping Use: Never used  Substance and Sexual Activity   Alcohol use: Yes    Comment: per patient "rarely"   Drug use: Not Currently    Types: Marijuana    Comment: CBD last use 2 weeks ago ( 11/29/19)   Sexual activity: Not on file  Other Topics Concern   Not on file  Social History Narrative   Not on file   Social Determinants of Health   Financial Resource Strain: Low Risk    Difficulty of Paying Living Expenses: Not hard at all  Food Insecurity: No Food Insecurity   Worried About Charity fundraiser in the Last Year: Never true   [place] in the Last Year: Never true  Transportation Needs: No Transportation Needs   Lack of Transportation (Medical): No   Lack of Transportation (Non-Medical): No   Physical Activity: Not on file  Stress: Stress Concern Present   Feeling of Stress : Rather much  Social Connections: Unknown   Frequency of Communication with Friends and Family: More than three times a week   Frequency of Social Gatherings with Friends and Family: More than three times a week   Attends Religious Services: Not on Electrical engineer or Organizations: Not on file   Attends Archivist Meetings: Not on file   Marital Status: Never married  Intimate Partner Violence: Not on file    FAMILY HISTORY: Family History  Problem Relation Age of Onset   Kidney cancer Father    Colon cancer Paternal Grandfather     ALLERGIES:  is allergic to other.  MEDICATIONS:  Current Outpatient Medications  Medication Sig Dispense Refill   busPIRone (BUSPAR) 10 MG tablet Take 10 mg by mouth 2 (two) times daily. Takes 1 x a day     acetaminophen (TYLENOL) 500 MG tablet Take 500 mg by mouth every 6 (six) hours as needed for mild pain or headache.     citalopram (CELEXA) 40 MG tablet Take 1 tablet (40 mg total)  by mouth daily. 90 tablet 1   lidocaine-prilocaine (EMLA) cream Apply 1 application topically as needed. (Patient taking differently: Apply 1 application topically as needed (port).) 30 g 1   No current facility-administered medications for this visit.    REVIEW OF SYSTEMS:   Constitutional: ( - ) fevers, ( - )  chills , ( - ) night sweats Eyes: ( - ) blurriness of vision, ( - ) double vision, ( - ) watery eyes Ears, nose, mouth, throat, and face: ( - ) mucositis, ( - ) sore throat Respiratory: ( - ) cough, ( - ) dyspnea, ( - ) wheezes Cardiovascular: ( - ) palpitation, ( - ) chest discomfort, ( - ) lower extremity swelling Gastrointestinal:  ( - ) nausea, ( - ) heartburn, ( - ) change in bowel habits Skin: ( - ) abnormal skin rashes Lymphatics: ( - ) new lymphadenopathy, ( - ) easy bruising Neurological: ( - ) numbness, ( - ) tingling, ( - ) new  weaknesses Behavioral/Psych: ( - ) mood change, ( - ) new changes  All other systems were reviewed with the patient and are negative.  PHYSICAL EXAMINATION: ECOG PERFORMANCE STATUS: 0 - Asymptomatic  Vitals:   10/14/20 1020  BP: 120/75  Pulse: 70  Resp: 20  Temp: (!) 97 F (36.1 C)  SpO2: 100%   Filed Weights   10/14/20 1020  Weight: 188 lb (85.3 kg)    GENERAL: well appearing fit young Caucasian male. alert, no distress and comfortable SKIN: skin color, texture, turgor are normal, no rashes or significant lesions. Small surgical incision at base of neck, well healed.  EYES: conjunctiva are pink and non-injected, sclera clear LUNGS: clear to auscultation and percussion with normal breathing effort HEART: regular rate & rhythm and no murmurs and no lower extremity edema Musculoskeletal: no cyanosis of digits and no clubbing  PSYCH: alert & oriented x 3, fluent speech NEURO: no focal motor/sensory deficits  LABORATORY DATA:  I have reviewed the data as listed CBC Latest Ref Rng & Units 10/14/2020 07/25/2020 05/16/2020  WBC 4.0 - 10.5 K/uL 5.8 6.5 5.5  Hemoglobin 13.0 - 17.0 g/dL 14.1 15.4 15.0  Hematocrit 39.0 - 52.0 % 39.1 44.7 42.7  Platelets 150 - 400 K/uL 232 216 209    CMP Latest Ref Rng & Units 10/14/2020 07/25/2020 05/16/2020  Glucose 70 - 99 mg/dL 94 96 89  BUN 6 - 20 mg/dL 24(H) 16 15  Creatinine 0.61 - 1.24 mg/dL 1.03 0.86 0.90  Sodium 135 - 145 mmol/L 139 142 139  Potassium 3.5 - 5.1 mmol/L 4.0 3.8 3.8  Chloride 98 - 111 mmol/L 100 104 105  CO2 22 - 32 mmol/L 33(H) 28 25  Calcium 8.9 - 10.3 mg/dL 9.3 9.2 9.2  Total Protein 6.5 - 8.1 g/dL 6.9 7.3 7.0  Total Bilirubin 0.3 - 1.2 mg/dL 1.2 1.1 1.3(H)  Alkaline Phos 38 - 126 U/L 61 64 57  AST 15 - 41 U/L 18 23 24   ALT 0 - 44 U/L 30 42 24    RADIOGRAPHIC STUDIES: None to review.   No results found.   ASSESSMENT & PLAN Martin Spencer 30 y.o. male with medical history significant for primary mediastinal B  cell lymphoma who presents for a follow up visit.   Previously we discussed the patient has a stage I classical Hodgkin's lymphoma, favorable disease.  He is young and otherwise fit and therefore would be able to tolerate full ABVD chemotherapy.  We  discussed expected side effects including nausea, vomiting, constipation, nerve damage, fertility loss, hair loss, and possible secondary malignancy.  The patient and his parents voiced their understanding of the reasoning behind obtaining PFTs, chemotherapy education, port placement, and fertility preservation at [facility].  They have the opportunity to ask all questions and concerns regarding treatment moving forward.  Unfortunately the PET CT scan on 07/23/2020 showed interval enlargement and significant increase in hypermetabolic activity of an anterior mediastinal mass concerning for lymphoma recurrence ( Deauville 5). Rebiopsy was performed at [facility] with pathology confirming the diagnossi of Primary Mediastinal B Cell lymphoma.   Today we discussed the concept of comanaged care.  Comanaged care is when the patient has a local primary provider who administers local support and therapy while expert advice and treatment recommendations are rendered by a cancer specialist at a large academic center.  In this arrangement we provide local support, labs, treatment, and emergency visits, however the major decisions regarding the course of treatment are decided by a physician at an academic medical center.  The patient voiced his understanding of comanaged care and was agreeable to proceeding forward with this care model.   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 At that time the patient carried a diagnosis of Hodgkin lymphoma --subsequent tissue testing after growth in residual FDG avid tissue in the mediastinum revealed Primary Mediastinal B cell lymphoma.    --patient has completed PFTs, Port, and fertility preservation. TTE performed on 11/18/2019 (EF 65%)  --patient is currently being treated at [facility] with R-EPOCH chemotherapy. He is currently s/p Cycle 1 --plan to provide supportive care with twice weekly labs and GCSF support as recommend by Dr. Jolayne Haines at [facility] --RTC following Cycle 2 for continued supportive care  #Anxiety --currently taking citalopram 20mg  PO daily --patient asks for benzodiazepines for scans, treatment, and for anxiety. I do not recommend benzos for this indication.  --continue discussions with social work --continue to monitor    #Thrombocytopenia, resolved --Plt 232 today --continue to monitor  # Symptom Management -- prescribed zofran 8mg  PO q8H PRN and compazine 10mg  q6H PRN.  --EMLA cream for port --encourage claratin/ibuprofen with GCSF shot. GCSF being administered the Monday after R-EPOCH --strict return precautions for shortness of breath, cough, fevers.   No orders of the defined types were placed in this encounter.  All questions were answered. The patient knows to call the clinic with any problems, questions or concerns.  A total of more than 30 minutes were spent on this encounter and over half of that time was spent on counseling and coordination of care as outlined above.   Ledell Peoples, MD Department of Hematology/Oncology [place] at [facility] Phone: [phone] Pager: [phone] Email: [email or web address]  10/14/2020 11:58 AM

## 2020-10-14 NOTE — Patient Instructions (Signed)

## 2020-10-17 ENCOUNTER — Inpatient Hospital Stay: Payer: BC Managed Care – PPO

## 2020-10-17 ENCOUNTER — Other Ambulatory Visit: Payer: Self-pay

## 2020-10-17 DIAGNOSIS — C8522 Mediastinal (thymic) large B-cell lymphoma, intrathoracic lymph nodes: Secondary | ICD-10-CM | POA: Diagnosis not present

## 2020-10-17 DIAGNOSIS — C8112 Nodular sclerosis classical Hodgkin lymphoma, intrathoracic lymph nodes: Secondary | ICD-10-CM

## 2020-10-17 LAB — CBC WITH DIFFERENTIAL (CANCER CENTER ONLY)
Abs Immature Granulocytes: 0.62 10*3/uL — ABNORMAL HIGH (ref 0.00–0.07)
Basophils Absolute: 0.1 10*3/uL (ref 0.0–0.1)
Basophils Relative: 1 %
Eosinophils Absolute: 0.2 10*3/uL (ref 0.0–0.5)
Eosinophils Relative: 3 %
HCT: 38.7 % — ABNORMAL LOW (ref 39.0–52.0)
Hemoglobin: 13.6 g/dL (ref 13.0–17.0)
Immature Granulocytes: 11 %
Lymphocytes Relative: 19 %
Lymphs Abs: 1.1 10*3/uL (ref 0.7–4.0)
MCH: 29.6 pg (ref 26.0–34.0)
MCHC: 35.1 g/dL (ref 30.0–36.0)
MCV: 84.1 fL (ref 80.0–100.0)
Monocytes Absolute: 0.3 10*3/uL (ref 0.1–1.0)
Monocytes Relative: 6 %
Neutro Abs: 3.4 10*3/uL (ref 1.7–7.7)
Neutrophils Relative %: 60 %
Platelet Count: 167 10*3/uL (ref 150–400)
RBC: 4.6 MIL/uL (ref 4.22–5.81)
RDW: 11.8 % (ref 11.5–15.5)
WBC Count: 5.7 10*3/uL (ref 4.0–10.5)
nRBC: 0 % (ref 0.0–0.2)

## 2020-10-17 LAB — MAGNESIUM: Magnesium: 2 mg/dL (ref 1.7–2.4)

## 2020-10-17 LAB — CMP (CANCER CENTER ONLY)
ALT: 50 U/L — ABNORMAL HIGH (ref 0–44)
AST: 21 U/L (ref 15–41)
Albumin: 3.9 g/dL (ref 3.5–5.0)
Alkaline Phosphatase: 82 U/L (ref 38–126)
Anion gap: 14 (ref 5–15)
BUN: 19 mg/dL (ref 6–20)
CO2: 23 mmol/L (ref 22–32)
Calcium: 9.4 mg/dL (ref 8.9–10.3)
Chloride: 102 mmol/L (ref 98–111)
Creatinine: 0.89 mg/dL (ref 0.61–1.24)
GFR, Estimated: >60 mL/min (ref >60)
Glucose, Bld: 116 mg/dL — ABNORMAL HIGH (ref 70–99)
Potassium: 3.7 mmol/L (ref 3.5–5.1)
Sodium: 139 mmol/L (ref 135–145)
Total Bilirubin: 1.1 mg/dL (ref 0.3–1.2)
Total Protein: 7.2 g/dL (ref 6.5–8.1)

## 2020-10-17 LAB — LACTATE DEHYDROGENASE: LDH: 152 U/L (ref 98–192)

## 2020-10-18 ENCOUNTER — Telehealth: Payer: Self-pay | Admitting: *Deleted

## 2020-10-18 NOTE — Telephone Encounter (Signed)
-----   Message from Martin Slick, MD sent at 10/17/2020 11:15 AM EDT ----- Please let [name] know that his labs are stable.   ----- Message ----- From: Buel Ream, Lab In [place] Sent: 10/17/2020  10:10 AM EDT To: Martin Slick, MD

## 2020-10-18 NOTE — Telephone Encounter (Signed)
TCT patient regarding lab results from 10/17/20. Spoke with him and advised that his lab results are stable. Pt aware of f/u appts.

## 2020-10-21 ENCOUNTER — Other Ambulatory Visit: Payer: Self-pay

## 2020-10-21 ENCOUNTER — Inpatient Hospital Stay: Payer: BC Managed Care – PPO

## 2020-10-21 DIAGNOSIS — C8112 Nodular sclerosis classical Hodgkin lymphoma, intrathoracic lymph nodes: Secondary | ICD-10-CM

## 2020-10-21 DIAGNOSIS — C8522 Mediastinal (thymic) large B-cell lymphoma, intrathoracic lymph nodes: Secondary | ICD-10-CM | POA: Diagnosis not present

## 2020-10-21 LAB — CBC WITH DIFFERENTIAL (CANCER CENTER ONLY)
Abs Immature Granulocytes: 4.13 10*3/uL — ABNORMAL HIGH (ref 0.00–0.07)
Basophils Absolute: 0.1 10*3/uL (ref 0.0–0.1)
Basophils Relative: 0 %
Eosinophils Absolute: 0.1 10*3/uL (ref 0.0–0.5)
Eosinophils Relative: 0 %
HCT: 35.9 % — ABNORMAL LOW (ref 39.0–52.0)
Hemoglobin: 12.7 g/dL — ABNORMAL LOW (ref 13.0–17.0)
Immature Granulocytes: 16 %
Lymphocytes Relative: 8 %
Lymphs Abs: 1.9 10*3/uL (ref 0.7–4.0)
MCH: 30.1 pg (ref 26.0–34.0)
MCHC: 35.4 g/dL (ref 30.0–36.0)
MCV: 85.1 fL (ref 80.0–100.0)
Monocytes Absolute: 3.4 10*3/uL — ABNORMAL HIGH (ref 0.1–1.0)
Monocytes Relative: 14 %
Neutro Abs: 15.6 10*3/uL — ABNORMAL HIGH (ref 1.7–7.7)
Neutrophils Relative %: 62 %
Platelet Count: 116 10*3/uL — ABNORMAL LOW (ref 150–400)
RBC: 4.22 MIL/uL (ref 4.22–5.81)
RDW: 12.4 % (ref 11.5–15.5)
WBC Count: 25.2 10*3/uL — ABNORMAL HIGH (ref 4.0–10.5)
nRBC: 0.4 % — ABNORMAL HIGH (ref 0.0–0.2)

## 2020-10-21 LAB — CMP (CANCER CENTER ONLY)
ALT: 35 U/L (ref 0–44)
AST: 32 U/L (ref 15–41)
Albumin: 3.7 g/dL (ref 3.5–5.0)
Alkaline Phosphatase: 122 U/L (ref 38–126)
Anion gap: 9 (ref 5–15)
BUN: 16 mg/dL (ref 6–20)
CO2: 26 mmol/L (ref 22–32)
Calcium: 9.3 mg/dL (ref 8.9–10.3)
Chloride: 104 mmol/L (ref 98–111)
Creatinine: 0.83 mg/dL (ref 0.61–1.24)
GFR, Estimated: >60 mL/min (ref >60)
Glucose, Bld: 111 mg/dL — ABNORMAL HIGH (ref 70–99)
Potassium: 3.8 mmol/L (ref 3.5–5.1)
Sodium: 139 mmol/L (ref 135–145)
Total Bilirubin: 0.6 mg/dL (ref 0.3–1.2)
Total Protein: 6.9 g/dL (ref 6.5–8.1)

## 2020-10-21 LAB — MAGNESIUM: Magnesium: 2 mg/dL (ref 1.7–2.4)

## 2020-10-21 LAB — LACTATE DEHYDROGENASE: LDH: 739 U/L — ABNORMAL HIGH (ref 98–192)

## 2020-10-24 ENCOUNTER — Other Ambulatory Visit: Payer: Self-pay

## 2020-10-24 ENCOUNTER — Inpatient Hospital Stay: Payer: BC Managed Care – PPO

## 2020-10-24 DIAGNOSIS — C8522 Mediastinal (thymic) large B-cell lymphoma, intrathoracic lymph nodes: Secondary | ICD-10-CM | POA: Diagnosis not present

## 2020-10-24 DIAGNOSIS — C8112 Nodular sclerosis classical Hodgkin lymphoma, intrathoracic lymph nodes: Secondary | ICD-10-CM

## 2020-10-24 LAB — LACTATE DEHYDROGENASE: LDH: 387 U/L — ABNORMAL HIGH (ref 98–192)

## 2020-10-24 LAB — CBC WITH DIFFERENTIAL (CANCER CENTER ONLY)
Abs Immature Granulocytes: 2.6 10*3/uL — ABNORMAL HIGH (ref 0.00–0.07)
Band Neutrophils: 8 %
Basophils Absolute: 0 10*3/uL (ref 0.0–0.1)
Basophils Relative: 0 %
Eosinophils Absolute: 0 10*3/uL (ref 0.0–0.5)
Eosinophils Relative: 0 %
HCT: 37.6 % — ABNORMAL LOW (ref 39.0–52.0)
Hemoglobin: 13 g/dL (ref 13.0–17.0)
Lymphocytes Relative: 17 %
Lymphs Abs: 3.2 10*3/uL (ref 0.7–4.0)
MCH: 29.7 pg (ref 26.0–34.0)
MCHC: 34.6 g/dL (ref 30.0–36.0)
MCV: 85.8 fL (ref 80.0–100.0)
Metamyelocytes Relative: 7 %
Monocytes Absolute: 0.7 10*3/uL (ref 0.1–1.0)
Monocytes Relative: 4 %
Myelocytes: 7 %
Neutro Abs: 12.1 10*3/uL — ABNORMAL HIGH (ref 1.7–7.7)
Neutrophils Relative %: 57 %
Platelet Count: 141 10*3/uL — ABNORMAL LOW (ref 150–400)
RBC: 4.38 MIL/uL (ref 4.22–5.81)
RDW: 12.4 % (ref 11.5–15.5)
WBC Count: 18.6 10*3/uL — ABNORMAL HIGH (ref 4.0–10.5)
nRBC: 0.2 % (ref 0.0–0.2)

## 2020-10-24 LAB — CMP (CANCER CENTER ONLY)
ALT: 29 U/L (ref 0–44)
AST: 20 U/L (ref 15–41)
Albumin: 4 g/dL (ref 3.5–5.0)
Alkaline Phosphatase: 121 U/L (ref 38–126)
Anion gap: 10 (ref 5–15)
BUN: 17 mg/dL (ref 6–20)
CO2: 26 mmol/L (ref 22–32)
Calcium: 9.2 mg/dL (ref 8.9–10.3)
Chloride: 105 mmol/L (ref 98–111)
Creatinine: 0.95 mg/dL (ref 0.61–1.24)
GFR, Estimated: >60 mL/min (ref >60)
Glucose, Bld: 80 mg/dL (ref 70–99)
Potassium: 3.9 mmol/L (ref 3.5–5.1)
Sodium: 141 mmol/L (ref 135–145)
Total Bilirubin: 0.5 mg/dL (ref 0.3–1.2)
Total Protein: 7.2 g/dL (ref 6.5–8.1)

## 2020-10-24 LAB — MAGNESIUM: Magnesium: 2.1 mg/dL (ref 1.7–2.4)

## 2020-10-29 ENCOUNTER — Other Ambulatory Visit: Payer: Self-pay

## 2020-10-29 ENCOUNTER — Emergency Department (HOSPITAL_COMMUNITY): Payer: BC Managed Care – PPO

## 2020-10-29 ENCOUNTER — Emergency Department (HOSPITAL_COMMUNITY)
Admission: EM | Admit: 2020-10-29 | Discharge: 2020-10-29 | Disposition: A | Discharge status: Home / Self Care | Payer: BC Managed Care – PPO | Attending: Emergency Medicine | Admitting: Emergency Medicine

## 2020-10-29 ENCOUNTER — Other Ambulatory Visit: Payer: BC Managed Care – PPO

## 2020-10-29 DIAGNOSIS — Z20822 Contact with and (suspected) exposure to covid-19: Secondary | ICD-10-CM | POA: Insufficient documentation

## 2020-10-29 DIAGNOSIS — Z859 Personal history of malignant neoplasm, unspecified: Secondary | ICD-10-CM | POA: Insufficient documentation

## 2020-10-29 DIAGNOSIS — Z87891 Personal history of nicotine dependence: Secondary | ICD-10-CM | POA: Insufficient documentation

## 2020-10-29 DIAGNOSIS — J45909 Unspecified asthma, uncomplicated: Secondary | ICD-10-CM | POA: Insufficient documentation

## 2020-10-29 DIAGNOSIS — R0602 Shortness of breath: Secondary | ICD-10-CM | POA: Insufficient documentation

## 2020-10-29 DIAGNOSIS — R0789 Other chest pain: Secondary | ICD-10-CM

## 2020-10-29 DIAGNOSIS — D72829 Elevated white blood cell count, unspecified: Secondary | ICD-10-CM | POA: Insufficient documentation

## 2020-10-29 DIAGNOSIS — R0781 Pleurodynia: Secondary | ICD-10-CM

## 2020-10-29 DIAGNOSIS — R079 Chest pain, unspecified: Secondary | ICD-10-CM | POA: Diagnosis present

## 2020-10-29 LAB — CBC WITH DIFFERENTIAL/PLATELET
Abs Immature Granulocytes: 0.65 10*3/uL — ABNORMAL HIGH (ref 0.00–0.07)
Basophils Absolute: 0.1 10*3/uL (ref 0.0–0.1)
Basophils Relative: 1 %
Eosinophils Absolute: 0.1 10*3/uL (ref 0.0–0.5)
Eosinophils Relative: 0 %
HCT: 37.4 % — ABNORMAL LOW (ref 39.0–52.0)
Hemoglobin: 12.7 g/dL — ABNORMAL LOW (ref 13.0–17.0)
Immature Granulocytes: 4 %
Lymphocytes Relative: 12 %
Lymphs Abs: 1.8 10*3/uL (ref 0.7–4.0)
MCH: 29.3 pg (ref 26.0–34.0)
MCHC: 34 g/dL (ref 30.0–36.0)
MCV: 86.2 fL (ref 80.0–100.0)
Monocytes Absolute: 1.1 10*3/uL — ABNORMAL HIGH (ref 0.1–1.0)
Monocytes Relative: 7 %
Neutro Abs: 11.6 10*3/uL — ABNORMAL HIGH (ref 1.7–7.7)
Neutrophils Relative %: 76 %
Platelets: 223 10*3/uL (ref 150–400)
RBC: 4.34 MIL/uL (ref 4.22–5.81)
RDW: 12.4 % (ref 11.5–15.5)
WBC: 15.2 10*3/uL — ABNORMAL HIGH (ref 4.0–10.5)
nRBC: 0 % (ref 0.0–0.2)

## 2020-10-29 LAB — COMPREHENSIVE METABOLIC PANEL
ALT: 20 U/L (ref 0–44)
AST: 18 U/L (ref 15–41)
Albumin: 4.1 g/dL (ref 3.5–5.0)
Alkaline Phosphatase: 70 U/L (ref 38–126)
Anion gap: 8 (ref 5–15)
BUN: 17 mg/dL (ref 6–20)
CO2: 28 mmol/L (ref 22–32)
Calcium: 9.3 mg/dL (ref 8.9–10.3)
Chloride: 104 mmol/L (ref 98–111)
Creatinine, Ser: 0.92 mg/dL (ref 0.61–1.24)
GFR, Estimated: >60 mL/min (ref >60)
Glucose, Bld: 94 mg/dL (ref 70–99)
Potassium: 4 mmol/L (ref 3.5–5.1)
Sodium: 140 mmol/L (ref 135–145)
Total Bilirubin: 0.6 mg/dL (ref 0.3–1.2)
Total Protein: 7.1 g/dL (ref 6.5–8.1)

## 2020-10-29 LAB — RESP PANEL BY RT-PCR (FLU A&B, COVID) ARPGX2
Influenza A by PCR: NEGATIVE
Influenza B by PCR: NEGATIVE
SARS Coronavirus 2 by RT PCR: NEGATIVE

## 2020-10-29 LAB — TROPONIN I (HIGH SENSITIVITY)
Troponin I (High Sensitivity): 8 ng/L (ref <18)
Troponin I (High Sensitivity): 11 ng/L (ref <18)

## 2020-10-29 MED ORDER — FENTANYL CITRATE (PF) 100 MCG/2ML IJ SOLN
75.0000 ug | Freq: Once | INTRAMUSCULAR | Status: AC
  Administered 2020-10-29: 75 ug via INTRAVENOUS
  Filled 2020-10-29: qty 2

## 2020-10-29 MED ORDER — OXYCODONE-ACETAMINOPHEN 5-325 MG PO TABS
1.0000 | ORAL_TABLET | Freq: Once | ORAL | Status: AC
  Administered 2020-10-29: 1 via ORAL
  Filled 2020-10-29: qty 1

## 2020-10-29 MED ORDER — HYDROMORPHONE HCL 1 MG/ML IJ SOLN
0.5000 mg | Freq: Once | INTRAMUSCULAR | Status: AC
  Administered 2020-10-29: 0.5 mg via INTRAVENOUS
  Filled 2020-10-29: qty 1

## 2020-10-29 MED ORDER — SODIUM CHLORIDE (PF) 0.9 % IJ SOLN
INTRAMUSCULAR | Status: AC
  Filled 2020-10-29: qty 50

## 2020-10-29 MED ORDER — IOHEXOL 350 MG/ML SOLN
100.0000 mL | Freq: Once | INTRAVENOUS | Status: AC | PRN
  Administered 2020-10-29: 100 mL via INTRAVENOUS

## 2020-10-29 MED ORDER — OXYCODONE-ACETAMINOPHEN 5-325 MG PO TABS: 1.0000 | ORAL_TABLET | Freq: Four times a day (QID) | ORAL | 0 refills | Status: DC | PRN

## 2020-10-29 MED ORDER — ONDANSETRON HCL 4 MG/2ML IJ SOLN
4.0000 mg | Freq: Once | INTRAMUSCULAR | Status: AC
  Administered 2020-10-29: 4 mg via INTRAVENOUS
  Filled 2020-10-29: qty 2

## 2020-10-29 NOTE — ED Triage Notes (Signed)
[facility], HX non-hodgkin's lymphoma. Post surgery, currently on chemo treatments. Hx pericarditis, pt says pain feels similar.

## 2020-10-29 NOTE — ED Provider Notes (Signed)
[facility] DEPT Provider Note   CSN: [id number] Arrival date & time: 10/29/20  0353     History Chief Complaint  Patient presents with   Chest Pain    Martin Spencer is a 30 y.o. 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  The patient reports that he has been having intermittent chest pain in the center of his chest for the last 2 to 3 days.  He reports that the first episode of pain was constant for about a day before resolving spontaneously.  The pain will radiate "up into his trachea".  He reports associated shortness of breath, but reports that dyspnea worsens when his pain intensifies.  He characterizes the pain as "tight" and shooting.  Pain is pleuritic.  No known aggravating or alleviating factors.  He reports that his current episode of pain has been going on since earlier today.  He was initially able to fall asleep, but awoke from sleep due to worsening intensity of the pain.  He denies fever, chills, cough, palpitations, leg swelling, back pain, sore throat, abdominal pain, nausea, vomiting, diarrhea, dizziness, lightheadedness, visual changes.  No treatment for his symptoms prior to arrival.  Patient reports that he is currently undergoing R-EPOCH chemotherapy once every 3 weeks.  Next scheduled day is 7/7.  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   The history is provided by medical records. No language interpreter was used.      Past Medical History:  Diagnosis Date   ADD (attention deficit disorder)    Cancer ([place])    Childhood asthma    Pericarditis    Shortness of breath    per patient, since recent diagnoses of pericarditis, sometimes get SOB on rest    Patient Active Problem List   Diagnosis Date Noted    Nodular sclerosis Hodgkin lymphoma of intrathoracic lymph nodes ([place]) 12/18/2019   Chest pain 11/17/2019   Acute pericarditis 11/17/2019   Mediastinal lymphadenopathy 11/17/2019   Acute appendicitis 11/14/2017    Past Surgical History:  Procedure Laterality Date   APPENDECTOMY     FINE NEEDLE ASPIRATION  11/30/2019   Procedure: FINE NEEDLE ASPIRATION (FNA) LINEAR;  Surgeon: Candee Furbish, MD;  Location: [facility];  Service: Pulmonary;;   IR IMAGING GUIDED PORT INSERTION  12/26/2019   LAPAROSCOPIC APPENDECTOMY N/A 11/14/2017   Procedure: APPENDECTOMY LAPAROSCOPIC;  Surgeon: Georganna Skeans, MD;  Location: [facility];  Service: General;  Laterality: N/A;   MEDIASTINOSCOPY N/A 12/08/2019   Procedure: MEDIASTINOSCOPY;  Surgeon: Melrose Nakayama, MD;  Location: [facility];  Service: Thoracic;  Laterality: N/A;   VIDEO BRONCHOSCOPY WITH ENDOBRONCHIAL ULTRASOUND N/A 11/30/2019   Procedure: VIDEO BRONCHOSCOPY WITH ENDOBRONCHIAL ULTRASOUND;  Surgeon: Candee Furbish, MD;  Location: [facility] ENDOSCOPY;  Service: Pulmonary;  Laterality: N/A;   WISDOM TOOTH EXTRACTION         Family History  Problem Relation Age of Onset   Kidney cancer Father    Colon cancer Paternal Grandfather     Social History   Tobacco Use   Smoking status: Former    Pack years: 0.00    Types: Cigarettes   Smokeless tobacco: Never   Tobacco comments:    quit in high school  Vaping Use   Vaping Use: Never used  Substance Use Topics  Alcohol use: Yes    Comment: per patient "rarely"   Drug use: Not Currently    Types: Marijuana    Comment: CBD last use 2 weeks ago ( 11/29/19)    Home Medications Prior to Admission medications   Medication Sig Start Date End Date Taking? Authorizing Provider  oxyCODONE-acetaminophen (PERCOCET/ROXICET) 5-325 MG tablet Take 1 tablet by mouth every 6 (six) hours as needed for severe pain. 10/29/20  Yes Sharif Rendell A, PA-C  acetaminophen (TYLENOL) 500 MG tablet Take 500 mg by mouth every 6  (six) hours as needed for mild pain or headache.    [provider]  busPIRone (BUSPAR) 10 MG tablet Take 10 mg by mouth 2 (two) times daily. Takes 1 x a day    [provider]  citalopram (CELEXA) 40 MG tablet Take 1 tablet (40 mg total) by mouth daily. 06/20/20   Orson Slick, MD  lidocaine-prilocaine (EMLA) cream Apply 1 application topically as needed. Patient taking differently: Apply 1 application topically as needed (port). 12/21/19   Orson Slick, MD    Allergies    Other  Review of Systems   Review of Systems  Constitutional:  Negative for appetite change, chills, fatigue and fever.  HENT:  Negative for congestion, sinus pressure, sinus pain, sore throat and voice change.   Eyes:  Negative for visual disturbance.  Respiratory:  Positive for chest tightness and shortness of breath. Negative for cough, choking and wheezing.   Cardiovascular:  Positive for chest pain. Negative for palpitations and leg swelling.  Gastrointestinal:  Negative for abdominal pain, blood in stool, constipation, diarrhea, nausea and vomiting.  Genitourinary:  Negative for dysuria.  Musculoskeletal:  Negative for back pain, myalgias, neck pain and neck stiffness.  Skin:  Negative for color change, rash and wound.  Allergic/Immunologic: Negative for immunocompromised state.  Neurological:  Negative for dizziness, seizures, syncope, weakness, numbness and headaches.  Psychiatric/Behavioral:  Negative for confusion.    Physical Exam Updated Vital Signs BP (!) 111/58   Pulse 83   Temp 99 F (37.2 C) (Oral)   Resp 16   SpO2 99%   Physical Exam Vitals and nursing note reviewed.  Constitutional:      General: He is not in acute distress.    Appearance: He is well-developed. He is not ill-appearing, toxic-appearing or diaphoretic.  HENT:     Head: Normocephalic.  Eyes:     Conjunctiva/sclera: Conjunctivae normal.  Cardiovascular:     Rate and Rhythm: Normal rate and regular  rhythm.     Pulses: Normal pulses.     Heart sounds: Normal heart sounds. No murmur heard.   No friction rub. No gallop.  Pulmonary:     Effort: Pulmonary effort is normal. No respiratory distress.     Breath sounds: No stridor. No wheezing, rhonchi or rales.     Comments: Lungs are clear to auscultation bilaterally.  No tachypnea.  No adventitious breath sounds. Chest:     Chest wall: No tenderness.  Abdominal:     General: There is no distension.     Palpations: Abdomen is soft. There is no mass.     Tenderness: There is no abdominal tenderness. There is no right CVA tenderness, left CVA tenderness, guarding or rebound.     Hernia: No hernia is present.  Musculoskeletal:     Cervical back: Neck supple.  Skin:    General: Skin is warm and dry.  Neurological:     Mental Status:  He is alert.  Psychiatric:        Behavior: Behavior normal.    ED Results / Procedures / Treatments   Labs (all labs ordered are listed, but only abnormal results are displayed) Labs Reviewed  CBC WITH DIFFERENTIAL/PLATELET - Abnormal; Notable for the following components:      Result Value   WBC 15.2 (*)    Hemoglobin 12.7 (*)    HCT 37.4 (*)    Neutro Abs 11.6 (*)    Monocytes Absolute 1.1 (*)    Abs Immature Granulocytes 0.65 (*)    All other components within normal limits  RESP PANEL BY RT-PCR (FLU A&B, COVID) ARPGX2  COMPREHENSIVE METABOLIC PANEL  TROPONIN I (HIGH SENSITIVITY)  TROPONIN I (HIGH SENSITIVITY)    EKG EKG Interpretation  Date/Time:  Tuesday October 29 2020 04:10:34 EDT Ventricular Rate:  74 PR Interval:  134 QRS Duration: 104 QT Interval:  403 QTC Calculation: 448 R Axis:   96 Text Interpretation: Sinus rhythm Borderline right axis deviation Confirmed by Orpah Greek [phone]) on 10/29/2020 4:13:27 AM  Radiology DG Chest 2 View  Result Date: 10/29/2020 CLINICAL DATA:  Shortness of breath.  Chest pain. EXAM: CHEST - 2 VIEW COMPARISON:  PET-CT 07/23/2020.  Chest  x-ray 02/05/2020. FINDINGS: PowerPort catheter in stable position. Mediastinum and hilar structures appear stable. Radiopacities of undetermined etiology are noted over both pulmonary apices. Lungs are clear. No pleural effusion or pneumothorax. No acute bony abnormality. IMPRESSION: 1.  PowerPort catheter in stable position. 2.  No acute cardiopulmonary disease. Electronically Signed   By: Marcello Moores  Register   On: 10/29/2020 05:03   CT Angio Chest PE W and/or Wo Contrast  Result Date: 10/29/2020 CLINICAL DATA:  30 year old male with history of chest pain. Prior history of pericarditis. History of non-Hodgkin's lymphoma undergoing ongoing chemotherapy. EXAM: CT ANGIOGRAPHY CHEST WITH CONTRAST TECHNIQUE: Multidetector CT imaging of the chest was performed using the standard protocol during bolus administration of intravenous contrast. Multiplanar CT image reconstructions and MIPs were obtained to evaluate the vascular anatomy. CONTRAST:  168mL OMNIPAQUE IOHEXOL 350 MG/ML SOLN COMPARISON:  Chest CT 11/17/2019. FINDINGS: Cardiovascular: There are no filling defects within the pulmonary arterial tree to suggest pulmonary embolism. Heart size is normal. There is no significant pericardial fluid, thickening or pericardial calcification. No atherosclerotic calcifications in the thoracic aorta or the coronary arteries. Right internal jugular single-lumen porta cath with tip terminating in the right atrium. Mediastinum/Nodes: Numerous dense calcifications are noted throughout the mediastinal nodal stations, compatible with treated lymphoma. Residual anterior mediastinal lymphadenopathy, bulkiest on axial image 45 of series 5 where this measures up to 4.3 x 2.1 cm, decreased compared to the prior study. No hilar lymphadenopathy. Esophagus is unremarkable in appearance. No axillary lymphadenopathy. Lungs/Pleura: No acute consolidative airspace disease. No pleural effusions. No suspicious appearing pulmonary nodules or masses  are noted. Upper Abdomen: Unremarkable. Musculoskeletal: There are no aggressive appearing lytic or blastic lesions noted in the visualized portions of the skeleton. Review of the MIP images confirms the above findings. IMPRESSION: 1. No evidence of pulmonary embolism. 2. No acute findings in the thorax to account for the patient's symptoms. 3. Persistent but decreased anterior mediastinal lymphadenopathy, compatible with residual lymphoma, as above. Electronically Signed   By: Vinnie Langton M.D.   On: 10/29/2020 06:44    Procedures Procedures   Medications Ordered in ED Medications  oxyCODONE-acetaminophen (PERCOCET/ROXICET) 5-325 MG per tablet 1 tablet (has no administration in time range)  HYDROmorphone (DILAUDID) injection 0.5  mg (0.5 mg Intravenous Given 10/29/20 0445)  ondansetron (ZOFRAN) injection 4 mg (4 mg Intravenous Given 10/29/20 0445)  fentaNYL (SUBLIMAZE) injection 75 mcg (75 mcg Intravenous Given 10/29/20 0532)  sodium chloride (PF) 0.9 % injection (  Given 10/29/20 0700)  iohexol (OMNIPAQUE) 350 MG/ML injection 100 mL (100 mLs Intravenous Contrast Given 10/29/20 3149)    ED Course  I have reviewed the triage vital signs and the nursing notes.  Pertinent labs & imaging results that were available during my care of the patient were reviewed by me and considered in my medical decision making (see chart for details).    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  Afebrile.  Normotensive.  No tachypnea or hypoxia.   Labs and imaging of been reviewed and independently interpreted by me.  EKG with sinus rhythm.  Delta troponin is not elevated. HEAR score is 2. No metabolic derangements.  He has a leukocytosis, but this probably secondary to Neulasta.  Chest  x-ray is unremarkable.  CTA was obtained and there was no evidence of pulmonary embolism.  He has persistent but decreased anterior mediastinal lymphadenopathy. No pericardial fluid, thickening, or calcification.  No pleural effusions.  Doubt aortic dissection, ACS, PE, esophageal rupture, tension pneumothorax.  At this time, patient is hemodynamically stable and in no acute distress.  Pain has been downtrending since he arrived in the emergency department.  Will discharge with a short course of pain medication.  He can follow-up with his hematology and oncology team.  ER return precautions given.  He is hemodynamically stable in no acute distress.  Safe for discharge home with outpatient follow-up as discussed.  Final Clinical Impression(s) / ED Diagnoses Final diagnoses:  Pleuritic chest pain  Atypical chest pain    Rx / DC Orders ED Discharge Orders          Ordered    oxyCODONE-acetaminophen (PERCOCET/ROXICET) 5-325 MG tablet  Every 6 hours PRN        10/29/20 0651             Joline Maxcy A, PA-C 10/29/20 7026    Orpah Greek, MD 10/29/20 [phone]

## 2020-10-29 NOTE — Discharge Instructions (Addendum)
Thank you for allowing me to care for you today in the Emergency Department.   Follow closely with your oncology team.  I would let them know that you are seen in the ER today so that they can access your records and your work-up.  Take 650 mg of Tylenol or 600 mg of ibuprofen with food every 6 hours for pain.  You can alternate between these 2 medications every 3 hours if your pain returns.  For instance, you can take Tylenol at noon, followed by a dose of ibuprofen at 3, followed by second dose of Tylenol and 6.  For severe, uncontrollable pain, you can take 1 tablet of Percocet.  Each tablet of Percocet contains 325 mg of Tylenol.  Do not take more than 4000 mg of Tylenol from all sources in a 24-hour period.  Your COVID-19 test is pending.  If positive, you should receive a call from the hospital and the results will be available in [place].  Return to the emergency department if you develop respiratory distress, if you pass out, if you develop new numbness or weakness, uncontrollable vomiting, or other new, concerning symptoms, significantly worsening chest pain.

## 2020-10-31 ENCOUNTER — Telehealth: Payer: Self-pay | Admitting: *Deleted

## 2020-10-31 ENCOUNTER — Other Ambulatory Visit: Payer: Self-pay | Admitting: *Deleted

## 2020-10-31 DIAGNOSIS — C8522 Mediastinal (thymic) large B-cell lymphoma, intrathoracic lymph nodes: Secondary | ICD-10-CM

## 2020-10-31 NOTE — Telephone Encounter (Signed)
Received call from Rayetta Humphrey, RN, in-patient coordiantor for [facility] Heme/Onc. She is calling to advise that pt is being admitted today for his last round of chemotherapy. He is expected to be disharged on 11/04/20  She will be fax'ing a calendar and instructions for labs, pegfligrastin etc after discharge.  He is to have labs on 11/06/20 and pegfilgstin on 11/08/20, then labs 2 x a week for 2 more weeks.  He will then follow back @ [facility] for radiation treatments in August.  Scheduling request sent. Dr. Lorenso Courier made aware.

## 2020-11-01 ENCOUNTER — Telehealth: Payer: Self-pay | Admitting: Hematology and Oncology

## 2020-11-01 NOTE — Telephone Encounter (Signed)
Scheduled appts per 7/7 sch msg. Called pt, no answer and vm was full, unable to leave a msg. Mailed updated calendar to pt.

## 2020-11-04 ENCOUNTER — Ambulatory Visit: Payer: BC Managed Care – PPO | Admitting: Hematology and Oncology

## 2020-11-04 ENCOUNTER — Other Ambulatory Visit: Payer: BC Managed Care – PPO

## 2020-11-04 ENCOUNTER — Telehealth: Payer: Self-pay | Admitting: *Deleted

## 2020-11-04 ENCOUNTER — Telehealth: Payer: Self-pay | Admitting: Hematology and Oncology

## 2020-11-04 ENCOUNTER — Ambulatory Visit: Payer: BC Managed Care – PPO

## 2020-11-04 NOTE — Telephone Encounter (Signed)
Made to [facility] regarding his appts here this week. No answer and his vm mailbox is full.

## 2020-11-04 NOTE — Telephone Encounter (Signed)
Scheduled appointment per 07/11 sch msg. Left message.

## 2020-11-06 ENCOUNTER — Other Ambulatory Visit: Payer: Self-pay

## 2020-11-06 ENCOUNTER — Inpatient Hospital Stay: Payer: BC Managed Care – PPO

## 2020-11-06 ENCOUNTER — Inpatient Hospital Stay: Payer: BC Managed Care – PPO | Attending: Hematology and Oncology | Admitting: Hematology and Oncology

## 2020-11-06 ENCOUNTER — Other Ambulatory Visit: Payer: BC Managed Care – PPO

## 2020-11-06 VITALS — BP 134/63 | HR 75 | Temp 97.0°F | Resp 18 | Ht 71.0 in | Wt 183.1 lb

## 2020-11-06 DIAGNOSIS — C8112 Nodular sclerosis classical Hodgkin lymphoma, intrathoracic lymph nodes: Secondary | ICD-10-CM

## 2020-11-06 DIAGNOSIS — C8522 Mediastinal (thymic) large B-cell lymphoma, intrathoracic lymph nodes: Secondary | ICD-10-CM | POA: Diagnosis not present

## 2020-11-06 DIAGNOSIS — Z95828 Presence of other vascular implants and grafts: Secondary | ICD-10-CM | POA: Diagnosis not present

## 2020-11-06 DIAGNOSIS — R590 Localized enlarged lymph nodes: Secondary | ICD-10-CM | POA: Diagnosis not present

## 2020-11-06 DIAGNOSIS — Z5189 Encounter for other specified aftercare: Secondary | ICD-10-CM | POA: Insufficient documentation

## 2020-11-06 MED ORDER — PEGFILGRASTIM-CBQV 6 MG/0.6ML ~~LOC~~ SOSY
6.0000 mg | PREFILLED_SYRINGE | Freq: Once | SUBCUTANEOUS | Status: AC
  Administered 2020-11-06: 6 mg via SUBCUTANEOUS

## 2020-11-06 MED ORDER — PEGFILGRASTIM-CBQV 6 MG/0.6ML ~~LOC~~ SOSY
PREFILLED_SYRINGE | SUBCUTANEOUS | Status: AC
  Filled 2020-11-06: qty 0.6

## 2020-11-06 MED ORDER — TRAMADOL HCL 50 MG PO TABS: 50.0000 mg | ORAL_TABLET | Freq: Three times a day (TID) | ORAL | 0 refills | Status: DC | PRN

## 2020-11-06 NOTE — Patient Instructions (Signed)

## 2020-11-08 ENCOUNTER — Inpatient Hospital Stay: Payer: BC Managed Care – PPO

## 2020-11-08 ENCOUNTER — Ambulatory Visit: Payer: BC Managed Care – PPO

## 2020-11-08 ENCOUNTER — Other Ambulatory Visit: Payer: Self-pay

## 2020-11-08 DIAGNOSIS — C8522 Mediastinal (thymic) large B-cell lymphoma, intrathoracic lymph nodes: Secondary | ICD-10-CM | POA: Diagnosis not present

## 2020-11-08 LAB — CBC WITH DIFFERENTIAL (CANCER CENTER ONLY)
Abs Immature Granulocytes: 0 10*3/uL (ref 0.00–0.07)
Band Neutrophils: 3 %
Basophils Absolute: 0 10*3/uL (ref 0.0–0.1)
Basophils Relative: 0 %
Eosinophils Absolute: 0 10*3/uL (ref 0.0–0.5)
Eosinophils Relative: 0 %
HCT: 32.2 % — ABNORMAL LOW (ref 39.0–52.0)
Hemoglobin: 11.7 g/dL — ABNORMAL LOW (ref 13.0–17.0)
Lymphocytes Relative: 1 %
Lymphs Abs: 0.3 10*3/uL — ABNORMAL LOW (ref 0.7–4.0)
MCH: 29.9 pg (ref 26.0–34.0)
MCHC: 36.3 g/dL — ABNORMAL HIGH (ref 30.0–36.0)
MCV: 82.4 fL (ref 80.0–100.0)
Monocytes Absolute: 0 10*3/uL — ABNORMAL LOW (ref 0.1–1.0)
Monocytes Relative: 0 %
Neutro Abs: 32.7 10*3/uL — ABNORMAL HIGH (ref 1.7–7.7)
Neutrophils Relative %: 96 %
Platelet Count: 214 10*3/uL (ref 150–400)
RBC: 3.91 MIL/uL — ABNORMAL LOW (ref 4.22–5.81)
RDW: 12.6 % (ref 11.5–15.5)
WBC Count: 33 10*3/uL — ABNORMAL HIGH (ref 4.0–10.5)
nRBC: 0 % (ref 0.0–0.2)

## 2020-11-08 LAB — CMP (CANCER CENTER ONLY)
ALT: 11 U/L (ref 0–44)
AST: 11 U/L — ABNORMAL LOW (ref 15–41)
Albumin: 3.9 g/dL (ref 3.5–5.0)
Alkaline Phosphatase: 84 U/L (ref 38–126)
Anion gap: 7 (ref 5–15)
BUN: 14 mg/dL (ref 6–20)
CO2: 30 mmol/L (ref 22–32)
Calcium: 9.4 mg/dL (ref 8.9–10.3)
Chloride: 101 mmol/L (ref 98–111)
Creatinine: 0.73 mg/dL (ref 0.61–1.24)
GFR, Estimated: >60 mL/min (ref >60)
Glucose, Bld: 93 mg/dL (ref 70–99)
Potassium: 4.2 mmol/L (ref 3.5–5.1)
Sodium: 138 mmol/L (ref 135–145)
Total Bilirubin: 1.2 mg/dL (ref 0.3–1.2)
Total Protein: 6.8 g/dL (ref 6.5–8.1)

## 2020-11-08 LAB — MAGNESIUM: Magnesium: 2.1 mg/dL (ref 1.7–2.4)

## 2020-11-10 ENCOUNTER — Encounter: Payer: Self-pay | Admitting: Hematology and Oncology

## 2020-11-10 NOTE — Progress Notes (Signed)
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 CXR performed showed right paratracheal density. CT recommended. CT angiogram showed diffuse enlargement of the thymus gland with associated mild mediastinal adenopathy including right paratracheal enlarged lymph node corresponding to radiographic abnormality 2) 11/19/2019: CT abdomen performed showing subtle mass versus steatosis within the caudate lobe of liver; due to history of thoracic adenopathy and thymic infiltration, follow-up non emergent MR imaging of the liver with and without contrast recommended to exclude hepatic mass 3) 11/24/2019: PET CT scan performed showing hypermetabolic mediastinal adenopathy, consistent with lymphoma. (Deauville 5). No extrathoracic disease 4) 11/30/2019: EBUS performed, failed to provide adequate tissue for diagnosis 5) 12/08/2019: mediastinoscopy performed, biopsy confirmed Hodgkin's lymphoma.  6) 12/28/2019 Cycle 1 Day 1 of ABVD Chemotherapy  7) 01/10/2020: Cycle 1 Day 15 of ABVD Chemotherapy  8) 01/23/2020: Cycle 2 Day 1 of ABVD Chemotherapy  9) 02/07/2020: Cycle 2 Day 15 of ABVD Chemotherapy  10) 02/19/2020: PET CT scan showed decrease in size and degree of FDG uptake associated with previously noted FDG avid mediastinal adenopathy with SUV max of 5.19. This is compatible with Deauville criteria 4. 11) 02/23/2020: Cycle 3 Day 1 of ABVD Chemotherapy 12) 03/07/2020: Cycle 3 Day 15 of ABVD Chemotherapy 13) 03/20/2020: Cycle 4 Day 1 of ABVD Chemotherapy 14) 04/03/2020:  Cycle 4 Day 15 of ABVD Chemotherapy 15)  07/23/2020: PET CT scan shows interval enlargement and significant increase in hypermetabolic activity of an anterior mediastinal mass concerning for lymphoma recurrence ( Deauville 5). 16) 08/07/2020: Mediastinoscopy to obtain new tissue. Findings show primary mediastinal B-cell lymphoma or a "gray zone lymphoma". 17) 10/07/2020: Cycle 1 Day 1 of R-EPOCH at [facility] 18) 10/31/2020: Cycle 2 Day 1 of R-EPOCH at [facility]  Interval History:  MYERS TUTTEROW 30 y.o. male with medical history significant for primary mediastinal B cell lymphoma who presents for a follow up visit. The patient was last seen on 10/14/2020 s/p Cycle 1 of R-EPOCH at [facility].  In the interim since last visit Mr. Tierce has established with [facility] medical center where he has begun treatment with R-EPOCH for primary mediastinal B-cell lymphoma.  On exam today Mr. Bomberger is accompanied by his mother.  He notes that he feels "depleted and on motivated".  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  He is requesting tramadol to help with the bone pain he is experiencing with his G-CSF therapy.  He notes that he mostly gets the pain in his hips and pelvis when he gets the G-CSF shot.  Radiation is coming up planned after a PET scan on 12/09/2020.  Symptomatically he is otherwise been quite well.  Otherwise he has been able to continue exercising and has no major questions concerns or complaints today.  He denies any fevers, chills, sweats, vomiting, or diarrhea.  A full 10 point ROS is listed below.   MEDICAL HISTORY:  Past Medical History:  Diagnosis Date   ADD (attention deficit disorder)    Cancer ([place])    Childhood asthma    Pericarditis    Shortness of breath  per patient, since recent diagnoses of pericarditis, sometimes get SOB on rest   SURGICAL HISTORY: Past Surgical History:  Procedure Laterality Date   APPENDECTOMY     FINE NEEDLE ASPIRATION   11/30/2019   Procedure: FINE NEEDLE ASPIRATION (FNA) LINEAR;  Surgeon: Candee Furbish, MD;  Location: [place];  Service: Pulmonary;;   IR IMAGING GUIDED PORT INSERTION  12/26/2019   LAPAROSCOPIC APPENDECTOMY N/A 11/14/2017   Procedure: APPENDECTOMY LAPAROSCOPIC;  Surgeon: Georganna Skeans, MD;  Location: [place];  Service: General;  Laterality: N/A;   MEDIASTINOSCOPY N/A 12/08/2019   Procedure: MEDIASTINOSCOPY;  Surgeon: Melrose Nakayama, MD;  Location: [place];  Service: Thoracic;  Laterality: N/A;   VIDEO BRONCHOSCOPY WITH ENDOBRONCHIAL ULTRASOUND N/A 11/30/2019   Procedure: VIDEO BRONCHOSCOPY WITH ENDOBRONCHIAL ULTRASOUND;  Surgeon: Candee Furbish, MD;  Location[facility] ENDOSCOPY;  Service: Pulmonary;  Laterality: N/A;   WISDOM TOOTH EXTRACTION      SOCIAL HISTORY: Social History   Socioeconomic History   Marital status: Single    Spouse name: Not on file   Number of children: Not on file   Years of education: Not on file   Highest education level: Not on file  Occupational History   Not on file  Tobacco Use   Smoking status: Former    Types: Cigarettes   Smokeless tobacco: Never   Tobacco comments:    quit in high school  Vaping Use   Vaping Use: Never used  Substance and Sexual Activity   Alcohol use: Yes    Comment: per patient "rarely"   Drug use: Not Currently    Types: Marijuana    Comment: CBD last use 2 weeks ago ( 11/29/19)   Sexual activity: Not on file  Other Topics Concern   Not on file  Social History Narrative   Not on file   Social Determinants of Health   Financial Resource Strain: Low Risk    Difficulty of Paying Living Expenses: Not hard at all  Food Insecurity: No Food Insecurity   Worried About Charity fundraiser in the Last Year: Never true   [place] in the Last Year: Never true  Transportation Needs: No Transportation Needs   Lack of Transportation (Medical): No   Lack of Transportation (Non-Medical): No  Physical Activity: Not on file   Stress: Stress Concern Present   Feeling of Stress : Rather much  Social Connections: Unknown   Frequency of Communication with Friends and Family: More than three times a week   Frequency of Social Gatherings with Friends and Family: More than three times a week   Attends Religious Services: Not on Electrical engineer or Organizations: Not on file   Attends Archivist Meetings: Not on file   Marital Status: Never married  Intimate Partner Violence: Not on file    FAMILY HISTORY: Family History  Problem Relation Age of Onset   Kidney cancer Father    Colon cancer Paternal Grandfather     ALLERGIES:  is allergic to other.  MEDICATIONS:  Current Outpatient Medications  Medication Sig Dispense Refill   citalopram (CELEXA) 40 MG tablet Take 1 tablet (40 mg total) by mouth daily. 90 tablet 1   traMADol (ULTRAM) 50 MG tablet Take 1 tablet (50 mg total) by mouth every 8 (eight) hours as needed. 20 tablet 0   acetaminophen (TYLENOL) 500 MG tablet Take 500 mg by mouth every 6 (six) hours as needed for mild pain  or headache.     busPIRone (BUSPAR) 10 MG tablet Take 10 mg by mouth 2 (two) times daily. Takes 1 x a day (Patient not taking: Reported on 11/06/2020)     lidocaine-prilocaine (EMLA) cream Apply 1 application topically as needed. (Patient taking differently: Apply 1 application topically as needed (port).) 30 g 1   oxyCODONE-acetaminophen (PERCOCET/ROXICET) 5-325 MG tablet Take 1 tablet by mouth every 6 (six) hours as needed for severe pain. (Patient not taking: Reported on 11/06/2020) 8 tablet 0   No current facility-administered medications for this visit.    REVIEW OF SYSTEMS:   Constitutional: ( - ) fevers, ( - )  chills , ( - ) night sweats Eyes: ( - ) blurriness of vision, ( - ) double vision, ( - ) watery eyes Ears, nose, mouth, throat, and face: ( - ) mucositis, ( - ) sore throat Respiratory: ( - ) cough, ( - ) dyspnea, ( - ) wheezes Cardiovascular: (  - ) palpitation, ( - ) chest discomfort, ( - ) lower extremity swelling Gastrointestinal:  ( - ) nausea, ( - ) heartburn, ( - ) change in bowel habits Skin: ( - ) abnormal skin rashes Lymphatics: ( - ) new lymphadenopathy, ( - ) easy bruising Neurological: ( - ) numbness, ( - ) tingling, ( - ) new weaknesses Behavioral/Psych: ( - ) mood change, ( - ) new changes  All other systems were reviewed with the patient and are negative.  PHYSICAL EXAMINATION: ECOG PERFORMANCE STATUS: 0 - Asymptomatic  Vitals:   11/06/20 1014  BP: 134/63  Pulse: 75  Resp: 18  Temp: (!) 97 F (36.1 C)  SpO2: 100%   Filed Weights   11/06/20 1014  Weight: 183 lb 1.6 oz (83.1 kg)    GENERAL: well appearing fit young Caucasian male. alert, no distress and comfortable SKIN: skin color, texture, turgor are normal, no rashes or significant lesions. Small surgical incision at base of neck, well healed.  EYES: conjunctiva are pink and non-injected, sclera clear LUNGS: clear to auscultation and percussion with normal breathing effort HEART: regular rate & rhythm and no murmurs and no lower extremity edema Musculoskeletal: no cyanosis of digits and no clubbing  PSYCH: alert & oriented x 3, fluent speech NEURO: no focal motor/sensory deficits  LABORATORY DATA:  I have reviewed the data as listed CBC Latest Ref Rng & Units 11/08/2020 10/29/2020 10/24/2020  WBC 4.0 - 10.5 K/uL 33.0(H) 15.2(H) 18.6(H)  Hemoglobin 13.0 - 17.0 g/dL 11.7(L) 12.7(L) 13.0  Hematocrit 39.0 - 52.0 % 32.2(L) 37.4(L) 37.6(L)  Platelets 150 - 400 K/uL 214 223 141(L)    CMP Latest Ref Rng & Units 11/08/2020 10/29/2020 10/24/2020  Glucose 70 - 99 mg/dL 93 94 80  BUN 6 - 20 mg/dL 14 17 17   Creatinine 0.61 - 1.24 mg/dL 0.73 0.92 0.95  Sodium 135 - 145 mmol/L 138 140 141  Potassium 3.5 - 5.1 mmol/L 4.2 4.0 3.9  Chloride 98 - 111 mmol/L 101 104 105  CO2 22 - 32 mmol/L 30 28 26   Calcium 8.9 - 10.3 mg/dL 9.4 9.3 9.2  Total Protein 6.5 - 8.1 g/dL  6.8 7.1 7.2  Total Bilirubin 0.3 - 1.2 mg/dL 1.2 0.6 0.5  Alkaline Phos 38 - 126 U/L 84 70 121  AST 15 - 41 U/L 11(L) 18 20  ALT 0 - 44 U/L 11 20 29     RADIOGRAPHIC STUDIES: None to review.   DG Chest 2 View  Result Date:  10/29/2020 CLINICAL DATA:  Shortness of breath.  Chest pain. EXAM: CHEST - 2 VIEW COMPARISON:  PET-CT 07/23/2020.  Chest x-ray 02/05/2020. FINDINGS: PowerPort catheter in stable position. Mediastinum and hilar structures appear stable. Radiopacities of undetermined etiology are noted over both pulmonary apices. Lungs are clear. No pleural effusion or pneumothorax. No acute bony abnormality. IMPRESSION: 1.  PowerPort catheter in stable position. 2.  No acute cardiopulmonary disease. Electronically Signed   By: Marcello Moores  Register   On: 10/29/2020 05:03   CT Angio Chest PE W and/or Wo Contrast  Result Date: 10/29/2020 CLINICAL DATA:  30 year old male with history of chest pain. Prior history of pericarditis. History of non-Hodgkin's lymphoma undergoing ongoing chemotherapy. EXAM: CT ANGIOGRAPHY CHEST WITH CONTRAST TECHNIQUE: Multidetector CT imaging of the chest was performed using the standard protocol during bolus administration of intravenous contrast. Multiplanar CT image reconstructions and MIPs were obtained to evaluate the vascular anatomy. CONTRAST:  17mL OMNIPAQUE IOHEXOL 350 MG/ML SOLN COMPARISON:  Chest CT 11/17/2019. FINDINGS: Cardiovascular: There are no filling defects within the pulmonary arterial tree to suggest pulmonary embolism. Heart size is normal. There is no significant pericardial fluid, thickening or pericardial calcification. No atherosclerotic calcifications in the thoracic aorta or the coronary arteries. Right internal jugular single-lumen porta cath with tip terminating in the right atrium. Mediastinum/Nodes: Numerous dense calcifications are noted throughout the mediastinal nodal stations, compatible with treated lymphoma. Residual anterior mediastinal  lymphadenopathy, bulkiest on axial image 45 of series 5 where this measures up to 4.3 x 2.1 cm, decreased compared to the prior study. No hilar lymphadenopathy. Esophagus is unremarkable in appearance. No axillary lymphadenopathy. Lungs/Pleura: No acute consolidative airspace disease. No pleural effusions. No suspicious appearing pulmonary nodules or masses are noted. Upper Abdomen: Unremarkable. Musculoskeletal: There are no aggressive appearing lytic or blastic lesions noted in the visualized portions of the skeleton. Review of the MIP images confirms the above findings. IMPRESSION: 1. No evidence of pulmonary embolism. 2. No acute findings in the thorax to account for the patient's symptoms. 3. Persistent but decreased anterior mediastinal lymphadenopathy, compatible with residual lymphoma, as above. Electronically Signed   By: Vinnie Langton M.D.   On: 10/29/2020 06:44     ASSESSMENT & PLAN HASHEEM VOLAND 30 y.o. male with medical history significant for primary mediastinal B cell lymphoma who presents for a follow up visit.   Previously we discussed the patient has a stage I classical Hodgkin's lymphoma, favorable disease.  He is young and otherwise fit and therefore would be able to tolerate full ABVD chemotherapy.  We discussed expected side effects including nausea, vomiting, constipation, nerve damage, fertility loss, hair loss, and possible secondary malignancy.  The patient and his parents voiced their understanding of the reasoning behind obtaining PFTs, chemotherapy education, port placement, and fertility preservation at [facility].  They have the opportunity to ask all questions and concerns regarding treatment moving forward.  Unfortunately the PET CT scan on 07/23/2020 showed interval enlargement and significant increase in hypermetabolic activity of an anterior mediastinal mass concerning for lymphoma recurrence ( Deauville 5). Rebiopsy was performed at [facility] with  pathology confirming the diagnossi of Primary Mediastinal B Cell lymphoma.   Previously we discussed the concept of comanaged care.  Comanaged care is when the patient has a local primary provider who administers local support and therapy while expert advice and treatment recommendations are rendered by a cancer specialist at a large academic center.  In this arrangement we provide local support, labs, treatment, and  emergency visits, however the major decisions regarding the course of treatment are decided by a physician at an academic medical center.  The patient voiced his understanding of comanaged care and was agreeable to proceeding forward with this care model.   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 At that time the patient carried a diagnosis of Hodgkin lymphoma --subsequent tissue testing after growth in residual FDG avid tissue in the mediastinum revealed Primary Mediastinal B cell lymphoma.   --patient has completed PFTs, Port, and fertility preservation. TTE performed on 11/18/2019 (EF 65%)  --patient is currently being treated at [facility] with R-EPOCH chemotherapy. He is currently s/p Cycle 2. Plan is for radiation to follow starting 12/09/2020.  --plan to provide supportive care with twice weekly labs and GCSF support as recommend by Dr. Jolayne Haines at [facility] following radiation for continued supportive care/clinical monitoring  #Anxiety --currently taking citalopram 20mg  PO daily --patient asks for benzodiazepines for scans, treatment, and for anxiety. I do not recommend benzos for this indication.  --continue discussions with social work --continue to monitor    #Thrombocytopenia, resolved --Plt 214 today --continue to monitor  # Symptom Management -- prescribed zofran 8mg  PO q8H PRN and compazine 10mg  q6H PRN.  --EMLA cream for port --encourage claratin/ibuprofen with GCSF shot. Will provide  him with Tramadol per his request.  --strict return precautions for shortness of breath, cough, fevers.   No orders of the defined types were placed in this encounter.  All questions were answered. The patient knows to call the clinic with any problems, questions or concerns.  A total of more than 30 minutes were spent on this encounter and over half of that time was spent on counseling and coordination of care as outlined above.   Ledell Peoples, MD Department of Hematology/Oncology [place] at [facility] Phone: [phone] Pager: [phone] Email: [email or web address]  11/10/2020 7:12 PM

## 2020-11-11 ENCOUNTER — Inpatient Hospital Stay: Payer: BC Managed Care – PPO

## 2020-11-11 ENCOUNTER — Other Ambulatory Visit: Payer: Self-pay

## 2020-11-11 ENCOUNTER — Telehealth: Payer: Self-pay | Admitting: *Deleted

## 2020-11-11 DIAGNOSIS — C8112 Nodular sclerosis classical Hodgkin lymphoma, intrathoracic lymph nodes: Secondary | ICD-10-CM

## 2020-11-11 DIAGNOSIS — C8522 Mediastinal (thymic) large B-cell lymphoma, intrathoracic lymph nodes: Secondary | ICD-10-CM | POA: Diagnosis not present

## 2020-11-11 LAB — CBC WITH DIFFERENTIAL (CANCER CENTER ONLY)
Abs Immature Granulocytes: 0.54 10*3/uL — ABNORMAL HIGH (ref 0.00–0.07)
Basophils Absolute: 0.1 10*3/uL (ref 0.0–0.1)
Basophils Relative: 2 %
Eosinophils Absolute: 0.1 10*3/uL (ref 0.0–0.5)
Eosinophils Relative: 2 %
HCT: 30.6 % — ABNORMAL LOW (ref 39.0–52.0)
Hemoglobin: 11 g/dL — ABNORMAL LOW (ref 13.0–17.0)
Immature Granulocytes: 9 %
Lymphocytes Relative: 17 %
Lymphs Abs: 1 10*3/uL (ref 0.7–4.0)
MCH: 29.8 pg (ref 26.0–34.0)
MCHC: 35.9 g/dL (ref 30.0–36.0)
MCV: 82.9 fL (ref 80.0–100.0)
Monocytes Absolute: 1.1 10*3/uL — ABNORMAL HIGH (ref 0.1–1.0)
Monocytes Relative: 19 %
Neutro Abs: 3 10*3/uL (ref 1.7–7.7)
Neutrophils Relative %: 51 %
Platelet Count: 107 10*3/uL — ABNORMAL LOW (ref 150–400)
RBC: 3.69 MIL/uL — ABNORMAL LOW (ref 4.22–5.81)
RDW: 12.5 % (ref 11.5–15.5)
WBC Count: 5.8 10*3/uL (ref 4.0–10.5)
nRBC: 0.7 % — ABNORMAL HIGH (ref 0.0–0.2)

## 2020-11-11 LAB — CMP (CANCER CENTER ONLY)
ALT: 17 U/L (ref 0–44)
AST: 13 U/L — ABNORMAL LOW (ref 15–41)
Albumin: 3.9 g/dL (ref 3.5–5.0)
Alkaline Phosphatase: 86 U/L (ref 38–126)
Anion gap: 7 (ref 5–15)
BUN: 12 mg/dL (ref 6–20)
CO2: 28 mmol/L (ref 22–32)
Calcium: 9.5 mg/dL (ref 8.9–10.3)
Chloride: 103 mmol/L (ref 98–111)
Creatinine: 0.77 mg/dL (ref 0.61–1.24)
GFR, Estimated: >60 mL/min (ref >60)
Glucose, Bld: 93 mg/dL (ref 70–99)
Potassium: 3.8 mmol/L (ref 3.5–5.1)
Sodium: 138 mmol/L (ref 135–145)
Total Bilirubin: 0.8 mg/dL (ref 0.3–1.2)
Total Protein: 6.9 g/dL (ref 6.5–8.1)

## 2020-11-11 LAB — MAGNESIUM: Magnesium: 2.1 mg/dL (ref 1.7–2.4)

## 2020-11-11 LAB — LACTATE DEHYDROGENASE: LDH: 177 U/L (ref 98–192)

## 2020-11-11 NOTE — Telephone Encounter (Signed)
Received call from patient, requesting refill of his Tramadol. He is having pain from his Neulasta injection from last week.  Dr. Lorenso Courier made aware

## 2020-11-14 ENCOUNTER — Other Ambulatory Visit: Payer: Self-pay

## 2020-11-14 ENCOUNTER — Inpatient Hospital Stay: Payer: BC Managed Care – PPO

## 2020-11-14 DIAGNOSIS — C8522 Mediastinal (thymic) large B-cell lymphoma, intrathoracic lymph nodes: Secondary | ICD-10-CM | POA: Diagnosis not present

## 2020-11-14 DIAGNOSIS — C8112 Nodular sclerosis classical Hodgkin lymphoma, intrathoracic lymph nodes: Secondary | ICD-10-CM

## 2020-11-14 LAB — MAGNESIUM: Magnesium: 2.1 mg/dL (ref 1.7–2.4)

## 2020-11-14 LAB — CBC WITH DIFFERENTIAL (CANCER CENTER ONLY)
Abs Immature Granulocytes: 6.18 10*3/uL — ABNORMAL HIGH (ref 0.00–0.07)
Basophils Absolute: 0.1 10*3/uL (ref 0.0–0.1)
Basophils Relative: 0 %
Eosinophils Absolute: 0.1 10*3/uL (ref 0.0–0.5)
Eosinophils Relative: 0 %
HCT: 32.1 % — ABNORMAL LOW (ref 39.0–52.0)
Hemoglobin: 11.2 g/dL — ABNORMAL LOW (ref 13.0–17.0)
Immature Granulocytes: 25 %
Lymphocytes Relative: 7 %
Lymphs Abs: 1.8 10*3/uL (ref 0.7–4.0)
MCH: 30.4 pg (ref 26.0–34.0)
MCHC: 34.9 g/dL (ref 30.0–36.0)
MCV: 87 fL (ref 80.0–100.0)
Monocytes Absolute: 2.3 10*3/uL — ABNORMAL HIGH (ref 0.1–1.0)
Monocytes Relative: 9 %
Neutro Abs: 14.8 10*3/uL — ABNORMAL HIGH (ref 1.7–7.7)
Neutrophils Relative %: 59 %
Platelet Count: 73 10*3/uL — ABNORMAL LOW (ref 150–400)
RBC: 3.69 MIL/uL — ABNORMAL LOW (ref 4.22–5.81)
RDW: 14.6 % (ref 11.5–15.5)
WBC Count: 25.2 10*3/uL — ABNORMAL HIGH (ref 4.0–10.5)
nRBC: 1 % — ABNORMAL HIGH (ref 0.0–0.2)

## 2020-11-14 LAB — CMP (CANCER CENTER ONLY)
ALT: 24 U/L (ref 0–44)
AST: 20 U/L (ref 15–41)
Albumin: 4.2 g/dL (ref 3.5–5.0)
Alkaline Phosphatase: 94 U/L (ref 38–126)
Anion gap: 9 (ref 5–15)
BUN: 12 mg/dL (ref 6–20)
CO2: 26 mmol/L (ref 22–32)
Calcium: 9.4 mg/dL (ref 8.9–10.3)
Chloride: 105 mmol/L (ref 98–111)
Creatinine: 0.98 mg/dL (ref 0.61–1.24)
GFR, Estimated: >60 mL/min (ref >60)
Glucose, Bld: 99 mg/dL (ref 70–99)
Potassium: 4 mmol/L (ref 3.5–5.1)
Sodium: 140 mmol/L (ref 135–145)
Total Bilirubin: 0.9 mg/dL (ref 0.3–1.2)
Total Protein: 6.8 g/dL (ref 6.5–8.1)

## 2020-11-14 LAB — LACTATE DEHYDROGENASE: LDH: 328 U/L — ABNORMAL HIGH (ref 98–192)

## 2020-11-18 ENCOUNTER — Other Ambulatory Visit: Payer: Self-pay

## 2020-11-18 ENCOUNTER — Inpatient Hospital Stay: Payer: BC Managed Care – PPO

## 2020-11-18 DIAGNOSIS — C8522 Mediastinal (thymic) large B-cell lymphoma, intrathoracic lymph nodes: Secondary | ICD-10-CM

## 2020-11-18 DIAGNOSIS — C8112 Nodular sclerosis classical Hodgkin lymphoma, intrathoracic lymph nodes: Secondary | ICD-10-CM

## 2020-11-18 LAB — CBC WITH DIFFERENTIAL (CANCER CENTER ONLY)
Abs Immature Granulocytes: 1.4 10*3/uL — ABNORMAL HIGH (ref 0.00–0.07)
Band Neutrophils: 15 %
Basophils Absolute: 0.2 10*3/uL — ABNORMAL HIGH (ref 0.0–0.1)
Basophils Relative: 1 %
Eosinophils Absolute: 0.2 10*3/uL (ref 0.0–0.5)
Eosinophils Relative: 1 %
HCT: 34.9 % — ABNORMAL LOW (ref 39.0–52.0)
Hemoglobin: 12.2 g/dL — ABNORMAL LOW (ref 13.0–17.0)
Lymphocytes Relative: 15 %
Lymphs Abs: 2.7 10*3/uL (ref 0.7–4.0)
MCH: 30.7 pg (ref 26.0–34.0)
MCHC: 35 g/dL (ref 30.0–36.0)
MCV: 87.7 fL (ref 80.0–100.0)
Metamyelocytes Relative: 5 %
Monocytes Absolute: 1.1 10*3/uL — ABNORMAL HIGH (ref 0.1–1.0)
Monocytes Relative: 6 %
Myelocytes: 3 %
Neutro Abs: 12.3 10*3/uL — ABNORMAL HIGH (ref 1.7–7.7)
Neutrophils Relative %: 54 %
Platelet Count: 133 10*3/uL — ABNORMAL LOW (ref 150–400)
RBC: 3.98 MIL/uL — ABNORMAL LOW (ref 4.22–5.81)
RDW: 14.8 % (ref 11.5–15.5)
WBC Count: 17.8 10*3/uL — ABNORMAL HIGH (ref 4.0–10.5)
nRBC: 0.2 % (ref 0.0–0.2)

## 2020-11-18 LAB — MAGNESIUM: Magnesium: 2.1 mg/dL (ref 1.7–2.4)

## 2020-11-18 LAB — CMP (CANCER CENTER ONLY)
ALT: 19 U/L (ref 0–44)
AST: 15 U/L (ref 15–41)
Albumin: 4 g/dL (ref 3.5–5.0)
Alkaline Phosphatase: 98 U/L (ref 38–126)
Anion gap: 8 (ref 5–15)
BUN: 15 mg/dL (ref 6–20)
CO2: 27 mmol/L (ref 22–32)
Calcium: 9.7 mg/dL (ref 8.9–10.3)
Chloride: 105 mmol/L (ref 98–111)
Creatinine: 0.89 mg/dL (ref 0.61–1.24)
GFR, Estimated: >60 mL/min (ref >60)
Glucose, Bld: 110 mg/dL — ABNORMAL HIGH (ref 70–99)
Potassium: 4.4 mmol/L (ref 3.5–5.1)
Sodium: 140 mmol/L (ref 135–145)
Total Bilirubin: 0.7 mg/dL (ref 0.3–1.2)
Total Protein: 6.9 g/dL (ref 6.5–8.1)

## 2020-11-18 LAB — LACTATE DEHYDROGENASE: LDH: 228 U/L — ABNORMAL HIGH (ref 98–192)

## 2020-11-21 ENCOUNTER — Other Ambulatory Visit: Payer: Self-pay

## 2020-11-21 ENCOUNTER — Inpatient Hospital Stay: Payer: BC Managed Care – PPO

## 2020-11-21 DIAGNOSIS — C8522 Mediastinal (thymic) large B-cell lymphoma, intrathoracic lymph nodes: Secondary | ICD-10-CM | POA: Diagnosis not present

## 2020-11-21 DIAGNOSIS — C8112 Nodular sclerosis classical Hodgkin lymphoma, intrathoracic lymph nodes: Secondary | ICD-10-CM

## 2020-11-21 LAB — CMP (CANCER CENTER ONLY)
ALT: 19 U/L (ref 0–44)
AST: 15 U/L (ref 15–41)
Albumin: 4.2 g/dL (ref 3.5–5.0)
Alkaline Phosphatase: 89 U/L (ref 38–126)
Anion gap: 7 (ref 5–15)
BUN: 16 mg/dL (ref 6–20)
CO2: 25 mmol/L (ref 22–32)
Calcium: 9.7 mg/dL (ref 8.9–10.3)
Chloride: 108 mmol/L (ref 98–111)
Creatinine: 1.01 mg/dL (ref 0.61–1.24)
GFR, Estimated: >60 mL/min (ref >60)
Glucose, Bld: 101 mg/dL — ABNORMAL HIGH (ref 70–99)
Potassium: 4 mmol/L (ref 3.5–5.1)
Sodium: 140 mmol/L (ref 135–145)
Total Bilirubin: 0.9 mg/dL (ref 0.3–1.2)
Total Protein: 6.9 g/dL (ref 6.5–8.1)

## 2020-11-21 LAB — CBC WITH DIFFERENTIAL (CANCER CENTER ONLY)
Abs Immature Granulocytes: 0.72 10*3/uL — ABNORMAL HIGH (ref 0.00–0.07)
Basophils Absolute: 0.1 10*3/uL (ref 0.0–0.1)
Basophils Relative: 1 %
Eosinophils Absolute: 0 10*3/uL (ref 0.0–0.5)
Eosinophils Relative: 0 %
HCT: 34.7 % — ABNORMAL LOW (ref 39.0–52.0)
Hemoglobin: 12 g/dL — ABNORMAL LOW (ref 13.0–17.0)
Immature Granulocytes: 8 %
Lymphocytes Relative: 14 %
Lymphs Abs: 1.4 10*3/uL (ref 0.7–4.0)
MCH: 30 pg (ref 26.0–34.0)
MCHC: 34.6 g/dL (ref 30.0–36.0)
MCV: 86.8 fL (ref 80.0–100.0)
Monocytes Absolute: 0.8 10*3/uL (ref 0.1–1.0)
Monocytes Relative: 9 %
Neutro Abs: 6.6 10*3/uL (ref 1.7–7.7)
Neutrophils Relative %: 68 %
Platelet Count: 235 10*3/uL (ref 150–400)
RBC: 4 MIL/uL — ABNORMAL LOW (ref 4.22–5.81)
RDW: 15 % (ref 11.5–15.5)
WBC Count: 9.6 10*3/uL (ref 4.0–10.5)
nRBC: 0.2 % (ref 0.0–0.2)

## 2020-11-21 LAB — MAGNESIUM: Magnesium: 2 mg/dL (ref 1.7–2.4)

## 2020-11-21 LAB — LACTATE DEHYDROGENASE: LDH: 218 U/L — ABNORMAL HIGH (ref 98–192)

## 2021-09-23 ENCOUNTER — Ambulatory Visit (HOSPITAL_COMMUNITY)
Admission: EM | Admit: 2021-09-23 | Discharge: 2021-09-23 | Disposition: A | Discharge status: Home / Self Care | Payer: BC Managed Care – PPO | Attending: Emergency Medicine | Admitting: Emergency Medicine

## 2021-09-23 ENCOUNTER — Encounter (HOSPITAL_COMMUNITY): Payer: Self-pay

## 2021-09-23 DIAGNOSIS — J069 Acute upper respiratory infection, unspecified: Secondary | ICD-10-CM | POA: Diagnosis not present

## 2021-09-23 MED ORDER — AZITHROMYCIN 250 MG PO TABS: 250.0000 mg | ORAL_TABLET | Freq: Every day | ORAL | 0 refills | Status: AC

## 2021-09-23 MED ORDER — PROMETHAZINE-DM 6.25-15 MG/5ML PO SYRP: 5.0000 mL | ORAL_SOLUTION | Freq: Four times a day (QID) | ORAL | 0 refills | Status: DC | PRN

## 2021-09-23 NOTE — Discharge Instructions (Signed)
Your symptoms today are most likely being caused by a virus and should steadily improve in time it can take up to 7 to 10 days before you truly start to see a turnaround however things will get better  As your symptoms have been present for 7 days with no signs of improvement, you may begin azithromycin as directed to cover for bacteria in the upper airways or sinuses which may be exacerbating her illness  You may use cough syrup every 6 hours as needed for additional comfort this medication may make you drowsy, if this occurs you may take half a dose for use at bedtime    You can take Tylenol and/or Ibuprofen as needed for fever reduction and pain relief.   For cough: honey 1/2 to 1 teaspoon (you can dilute the honey in water or another fluid).  You can also use guaifenesin for cough. You can use a humidifier for chest congestion and cough.  If you don't have a humidifier, you can sit in the bathroom with the hot shower running.      For sore throat: try warm salt water gargles, cepacol lozenges, throat spray, warm tea or water with lemon/honey, popsicles or ice, or OTC cold relief medicine for throat discomfort.   For congestion: take a daily anti-histamine like Zyrtec, Claritin, and a oral decongestant, such as pseudoephedrine.  You can also use Flonase 1-2 sprays in each nostril daily.   It is important to stay hydrated: drink plenty of fluids (water, gatorade/powerade/pedialyte, juices, or teas) to keep your throat moisturized and help further relieve irritation/discomfort.

## 2021-09-23 NOTE — ED Triage Notes (Signed)
Pt c/o cough and congestion x1 wk with fatigue. Pt taking OTC with no relief.

## 2021-09-23 NOTE — ED Provider Notes (Signed)
[place]    CSN: [id number] Arrival date & time: 09/23/21  1404      History   Chief Complaint Chief Complaint  Patient presents with   Cough    HPI Martin Spencer is a 31 y.o. male.   Patient presents with nasal congestion, rhinorrhea, sneezing, sore throat, nonproductive cough, shortness of breath with exertion and diarrhea for 7 days.  Last episode of diarrhea 1 day ago.  Tolerating food and liquids.  Known sick contacts and awaiting.  Home COVID test negative.  Has attempted use of Tessalon and Delsym which have been ineffective.  History of Hodgkin's lymphoma.    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;  Service: General;  Laterality: N/A;   MEDIASTINOSCOPY N/A 12/08/2019   Procedure: MEDIASTINOSCOPY;  Surgeon: Melrose Nakayama, MD;  Location: [place];  Service: Thoracic;  Laterality: N/A;   VIDEO BRONCHOSCOPY WITH ENDOBRONCHIAL ULTRASOUND N/A 11/30/2019   Procedure: VIDEO BRONCHOSCOPY WITH ENDOBRONCHIAL ULTRASOUND;  Surgeon: Candee Furbish, MD;  Location: [facility] ENDOSCOPY;  Service: Pulmonary;   Laterality: N/A;   WISDOM TOOTH EXTRACTION         Home Medications    Prior to Admission medications   Medication Sig Start Date End Date Taking? Authorizing Provider  acetaminophen (TYLENOL) 500 MG tablet Take 500 mg by mouth every 6 (six) hours as needed for mild pain or headache.    [provider]    Family History Family History  Problem Relation Age of Onset   Kidney cancer Father    Colon cancer Paternal Grandfather     Social History Social History   Tobacco Use   Smoking status: Former    Types: Cigarettes   Smokeless tobacco: Never   Tobacco comments:    quit in high school  Vaping Use   Vaping Use: Never used  Substance Use Topics   Alcohol use: Yes    Comment: per patient "rarely"   Drug use: Not Currently    Types: Marijuana    Comment: CBD last use 2 weeks ago ( 11/29/19)     Allergies   Other   Review of Systems Review of Systems  Constitutional: Negative.   HENT:  Positive for congestion, rhinorrhea, sneezing and sore throat. Negative for dental problem, drooling, ear discharge, ear pain, facial swelling, hearing loss, mouth sores, nosebleeds, postnasal drip, sinus pressure, sinus pain, tinnitus, trouble swallowing and voice change.   Respiratory:  Positive for cough and shortness of breath. Negative for apnea, choking, chest tightness, wheezing and stridor.   Cardiovascular: Negative.   Gastrointestinal:  Positive for diarrhea. Negative for abdominal distention, abdominal pain, anal bleeding, blood in stool, constipation, nausea, rectal pain and vomiting.  Skin: Negative.   Neurological: Negative.     Physical Exam Triage Vital Signs ED Triage Vitals  Enc Vitals Group     BP 09/23/21 1524 (!) 144/83     Pulse Rate 09/23/21 1524 67     Resp 09/23/21 1524 18     Temp 09/23/21 1524 97.6 F (36.4 C)     Temp Source 09/23/21 1524 Oral     SpO2 09/23/21 1524 99 %     Weight --      Height --      Head Circumference --      Peak  Flow --      Pain Score 09/23/21 1525 0     Pain Loc --      Pain Edu? --      Excl. in [place]? --    No data found.  Updated Vital Signs BP (!) 144/83 (BP Location: Left Arm)   Pulse 67   Temp 97.6 F (36.4 C) (Oral)   Resp 18   SpO2 99%   Visual Acuity Right Eye Distance:   Left Eye Distance:   Bilateral Distance:    Right Eye Near:   Left Eye Near:    Bilateral Near:     Physical Exam Constitutional:      Appearance: Normal appearance.  HENT:     Head: Normocephalic.     Right Ear: Tympanic membrane, ear canal and external ear normal.     Left Ear: Tympanic membrane, ear canal and external ear normal.     Mouth/Throat:     Mouth: Mucous membranes are moist.     Pharynx: Posterior oropharyngeal erythema present. No oropharyngeal exudate.  Eyes:     Extraocular Movements: Extraocular movements intact.  Cardiovascular:     Rate and Rhythm: Normal rate and regular rhythm.     Pulses: Normal pulses.     Heart sounds: Normal heart sounds.  Pulmonary:     Effort: Pulmonary effort is normal.     Breath sounds: Normal breath sounds.  Skin:    General: Skin is warm and dry.  Neurological:     Mental Status: He is alert and oriented to person, place, and time. Mental status is at baseline.  Psychiatric:        Mood and Affect: Mood normal.        Behavior: Behavior normal.     UC Treatments / Results  Labs (all labs ordered are listed, but only abnormal results are displayed) Labs Reviewed - No data to display  EKG   Radiology No results found.  Procedures Procedures (including critical care time)  Medications Ordered in UC Medications - No data to display  Initial Impression / Assessment and Plan / UC Course  I have reviewed the triage vital signs and the nursing notes.  Pertinent labs & imaging results that were available during my care of the patient were reviewed by me and considered in my medical decision making (see chart for details).  Viral URI  with cough  Vital signs are stable, O2 saturation 99% on room air, lungs are clear to auscultation, low suspicion for pneumonia, pneumothorax or bronchitis, will defer chest x-ray today, etiology is symptoms are most likely viral, as multiple people got sick from the same event however headache has been 7 days with no improvement, will trial antibiotic, Z-Pak prescribed, discussed administration, patient  endorses that Tessalon and Delsym have been ineffective, will attempt use of Promethazine DM for further management of coughing, given precautions for persistent or worsening symptoms to follow-up with urgent care as needed, may use additional over-the-counter medications Final Clinical Impressions(s) / UC Diagnoses   Final diagnoses:  None   Discharge Instructions   None    ED Prescriptions   None    PDMP not reviewed this encounter.   Hans Eden, NP 09/23/21 1551

## 2021-09-24 ENCOUNTER — Telehealth: Payer: BC Managed Care – PPO | Admitting: Physician Assistant

## 2021-09-24 ENCOUNTER — Encounter (HOSPITAL_COMMUNITY): Payer: Self-pay

## 2021-09-24 ENCOUNTER — Telehealth: Payer: BC Managed Care – PPO

## 2021-09-24 ENCOUNTER — Ambulatory Visit (HOSPITAL_COMMUNITY)
Admission: EM | Admit: 2021-09-24 | Discharge: 2021-09-24 | Disposition: A | Discharge status: Home / Self Care | Payer: BC Managed Care – PPO | Attending: Physician Assistant | Admitting: Physician Assistant

## 2021-09-24 DIAGNOSIS — J208 Acute bronchitis due to other specified organisms: Secondary | ICD-10-CM

## 2021-09-24 DIAGNOSIS — B9689 Other specified bacterial agents as the cause of diseases classified elsewhere: Secondary | ICD-10-CM | POA: Diagnosis not present

## 2021-09-24 DIAGNOSIS — J069 Acute upper respiratory infection, unspecified: Secondary | ICD-10-CM | POA: Diagnosis not present

## 2021-09-24 DIAGNOSIS — J209 Acute bronchitis, unspecified: Secondary | ICD-10-CM | POA: Diagnosis not present

## 2021-09-24 MED ORDER — PSEUDOEPH-BROMPHEN-DM 30-2-10 MG/5ML PO SYRP: 5.0000 mL | ORAL_SOLUTION | Freq: Four times a day (QID) | ORAL | 0 refills | Status: AC | PRN

## 2021-09-24 MED ORDER — HYDROCODONE BIT-HOMATROP MBR 5-1.5 MG/5ML PO SOLN: 5.0000 mL | Freq: Four times a day (QID) | ORAL | 0 refills | Status: AC | PRN

## 2021-09-24 NOTE — ED Triage Notes (Signed)
Patient complaining of dry cough. States went to a wedding week and a half ago and a lot of people there got sick. Some runny nose, sneezing, and the dry cough.

## 2021-09-24 NOTE — Discharge Instructions (Signed)
Advised to continue the antibiotic that she started taking yesterday. Advised to stop the Phenergan DM and replace it with the Hycodan cough syrup to gain control of the cough and spasms. Advised increase fluid intake, observe and follow-up with PCP or return to urgent care if symptoms fail to improve within the next 7 to 10 days

## 2021-09-24 NOTE — Patient Instructions (Signed)
Martin Spencer, thank you for joining [name], PA-C for today's virtual visit.  While this provider is not your primary care provider (PCP), if your PCP is located in our provider database this encounter information will be shared with them immediately following your visit.  Consent: (Patient) Martin Spencer provided verbal consent for this virtual visit at the beginning of the encounter.  Current Medications:  Current Outpatient Medications:    acetaminophen (TYLENOL) 500 MG tablet, Take 500 mg by mouth every 6 (six) hours as needed for mild pain or headache., Disp: , Rfl:    azithromycin (ZITHROMAX) 250 MG tablet, Take 1 tablet (250 mg total) by mouth daily. Take first 2 tablets together, then 1 every day until finished., Disp: 6 tablet, Rfl: 0   promethazine-dextromethorphan (PROMETHAZINE-DM) 6.25-15 MG/5ML syrup, Take 5 mLs by mouth 4 (four) times daily as needed for cough., Disp: 118 mL, Rfl: 0   Medications ordered in this encounter:  No orders of the defined types were placed in this encounter.    *If you need refills on other medications prior to your next appointment, please contact your pharmacy*  Follow-Up: Call back or seek an in-person evaluation if the symptoms worsen or if the condition fails to improve as anticipated.  Other Instructions Take antibiotic (Azithromycin) as directed.  Increase fluids.  Get plenty of rest. Use Mucinex for congestion. Use the Bromfed as directed. Take a daily probiotic (I recommend Align or Culturelle, but even Activia Yogurt may be beneficial).  A humidifier placed in the bedroom may offer some relief for a dry, scratchy throat of nasal irritation.  Read information below on acute bronchitis. Please call or return to clinic if symptoms are not improving.  Acute Bronchitis Bronchitis is when the airways that extend from the windpipe into the lungs get red, puffy, and painful (inflamed). Bronchitis often causes thick spit  (mucus) to develop. This leads to a cough. A cough is the most common symptom of bronchitis. In acute bronchitis, the condition usually begins suddenly and goes away over time (usually in 2 weeks). Smoking, allergies, and asthma can make bronchitis worse. Repeated episodes of bronchitis may cause more lung problems.  HOME CARE Rest. Drink enough fluids to keep your pee (urine) clear or pale yellow (unless you need to limit fluids as told by your doctor). Only take over-the-counter or prescription medicines as told by your doctor. Avoid smoking and secondhand smoke. These can make bronchitis worse. If you are a smoker, think about using nicotine gum or skin patches. Quitting smoking will help your lungs heal faster. Reduce the chance of getting bronchitis again by: Washing your hands often. Avoiding people with cold symptoms. Trying not to touch your hands to your mouth, nose, or eyes. Follow up with your doctor as told.  GET HELP IF: Your symptoms do not improve after 1 week of treatment. Symptoms include: Cough. Fever. Coughing up thick spit. Body aches. Chest congestion. Chills. Shortness of breath. Sore throat.  GET HELP RIGHT AWAY IF:  You have an increased fever. You have chills. You have severe shortness of breath. You have bloody thick spit (sputum). You throw up (vomit) often. You lose too much body fluid (dehydration). You have a severe headache. You faint.  MAKE SURE YOU:  Understand these instructions. Will watch your condition. Will get help right away if you are not doing well or get worse. Document Released: 09/30/2007 Document Revised: 12/14/2012 Document Reviewed: 10/04/2012 [facility] Patient Information 2015 [place], [place]. This  information is not intended to replace advice given to you by your health care provider. Make sure you discuss any questions you have with your health care provider.    If you have been instructed to have an in-person evaluation  today at a local Urgent Care facility, please use the link below. It will take you to a list of all of our available [facility], including address, phone number and hours of operation. Please do not delay care.  [facility]  If you or a family member do not have a primary care provider, use the link below to schedule a visit and establish care. When you choose a [facility] primary care physician or advanced practice provider, you gain a long-term partner in health. Find a Primary Care Provider  Learn more about 's in-office and virtual care options: [facility]

## 2021-09-24 NOTE — ED Provider Notes (Signed)
[place]    CSN: [id number] Arrival date & time: 09/24/21  1222      History   Chief Complaint Chief Complaint  Patient presents with   Cough    HPI Martin Spencer is a 31 y.o. male.   31 year old male presents with cough, and respiratory infection.  Patient relates that he has had for the past week and a half persistent cough, congestion, upper respiratory congestion, and rhinitis.  Patient relates he has been evaluated yesterday and was given an antibiotic which she started.  Patient relates he was given some Phenergan DM for the cough however this is not giving him any relief.  Patient relates that he is having coughing fits to where he coughs for a prolonged period of time and cannot stop.  Patient relates he is not having any wheezing, and no shortness of breath.  Patient is not having any fever or chills, but he has had some fatigue.  No nausea or vomiting.  He has tolerated the antibiotic well.   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   Chest pain 11/17/2019   Acute pericarditis 11/17/2019   Mediastinal lymphadenopathy 11/17/2019   Acute appendicitis 11/14/2017    Past Surgical History:  Procedure Laterality Date   APPENDECTOMY     FINE NEEDLE ASPIRATION  11/30/2019   Procedure: FINE NEEDLE ASPIRATION (FNA) LINEAR;  Surgeon: Candee Furbish, MD;  Location: [place];  Service: Pulmonary;;   IR IMAGING GUIDED PORT INSERTION  12/26/2019   LAPAROSCOPIC APPENDECTOMY N/A 11/14/2017   Procedure: APPENDECTOMY LAPAROSCOPIC;  Surgeon: Georganna Skeans, MD;  Location: [place];  Service: General;  Laterality: N/A;    MEDIASTINOSCOPY N/A 12/08/2019   Procedure: MEDIASTINOSCOPY;  Surgeon: Melrose Nakayama, MD;  Location: [place];  Service: Thoracic;  Laterality: N/A;   VIDEO BRONCHOSCOPY WITH ENDOBRONCHIAL ULTRASOUND N/A 11/30/2019   Procedure: VIDEO BRONCHOSCOPY WITH ENDOBRONCHIAL ULTRASOUND;  Surgeon: Candee Furbish, MD;  Location: [facility] ENDOSCOPY;  Service: Pulmonary;  Laterality: N/A;   WISDOM TOOTH EXTRACTION         Home Medications    Prior to Admission medications   Medication Sig Start Date End Date Taking? Authorizing Provider  azithromycin (ZITHROMAX) 250 MG tablet Take 1 tablet (250 mg total) by mouth daily. Take first 2 tablets together, then 1 every day until finished. 09/23/21  Yes White, Adrienne R, NP  brompheniramine-pseudoephedrine-DM 30-2-10 MG/5ML syrup Take 5 mLs by mouth 4 (four) times daily as needed. 09/24/21  Yes Brunetta Jeans, PA-C  HYDROcodone bit-homatropine (HYCODAN) 5-1.5 MG/5ML syrup Take 5 mLs by mouth every 6 (six) hours as needed for cough. 09/24/21  Yes Nyoka Lint, PA-C  acetaminophen (TYLENOL) 500 MG tablet Take 500 mg by mouth every 6 (six) hours as needed for mild pain or headache.    [provider]    Family History Family History  Problem Relation Age of Onset   Kidney cancer Father    Colon cancer Paternal Grandfather     Social History Social History   Tobacco Use   Smoking status: Former    Types: Cigarettes  Smokeless tobacco: Never   Tobacco comments:    quit in high school  Vaping Use   Vaping Use: Never used  Substance Use Topics   Alcohol use: Yes    Comment: per patient "rarely"   Drug use: Not Currently    Types: Marijuana    Comment: CBD last use 2 weeks ago ( 11/29/19)     Allergies   Other   Review of Systems Review of Systems  Respiratory:  Positive for cough.     Physical Exam Triage Vital Signs ED Triage Vitals  Enc Vitals Group     BP 09/24/21 1303 119/67     Pulse Rate 09/24/21 1303 63     Resp  09/24/21 1303 16     Temp 09/24/21 1303 97.7 F (36.5 C)     Temp Source 09/24/21 1303 Oral     SpO2 09/24/21 1303 100 %     Weight 09/24/21 1302 180 lb (81.6 kg)     Height 09/24/21 1302 '5\' 11"'$  (1.803 m)     Head Circumference --      Peak Flow --      Pain Score 09/24/21 1301 2     Pain Loc --      Pain Edu? --      Excl. in [place]? --    No data found.  Updated Vital Signs BP 119/67 (BP Location: Left Arm)   Pulse 63   Temp 97.7 F (36.5 C) (Oral)   Resp 16   Ht '5\' 11"'$  (1.803 m)   Wt 180 lb (81.6 kg)   SpO2 100%   BMI 25.10 kg/m   Visual Acuity Right Eye Distance:   Left Eye Distance:   Bilateral Distance:    Right Eye Near:   Left Eye Near:    Bilateral Near:     Physical Exam Constitutional:      Appearance: Normal appearance.  HENT:     Right Ear: Tympanic membrane and ear canal normal.     Left Ear: Tympanic membrane and ear canal normal.     Mouth/Throat:     Pharynx: Oropharynx is clear.     Tonsils: No tonsillar exudate.  Cardiovascular:     Rate and Rhythm: Normal rate and regular rhythm.     Heart sounds: Normal heart sounds.  Pulmonary:     Effort: Pulmonary effort is normal.     Breath sounds: Normal breath sounds and air entry. No wheezing, rhonchi or rales.  Lymphadenopathy:     Cervical: No cervical adenopathy.  Neurological:     Mental Status: He is alert.     UC Treatments / Results  Labs (all labs ordered are listed, but only abnormal results are displayed) Labs Reviewed - No data to display  EKG   Radiology No results found.  Procedures Procedures (including critical care time)  Medications Ordered in UC Medications - No data to display  Initial Impression / Assessment and Plan / UC Course  I have reviewed the triage vital signs and the nursing notes.  Pertinent labs & imaging results that were available during my care of the patient were reviewed by me and considered in my medical decision making (see chart for  details).    Plan: 1.  Advised the patient to continue the antibiotic as prescribed to take to treat the infection 2.  Change the Promethazine DM to Hycodan cough syrup 1 to 2 teaspoons every 6-8 hours to control the bronchial spasm. 3.  Advised  patient to follow-up PCP or return if symptoms fail to improve. Final Clinical Impressions(s) / UC Diagnoses   Final diagnoses:  Viral URI with cough  Acute bronchitis, unspecified organism     Discharge Instructions      Advised to continue the antibiotic that she started taking yesterday. Advised to stop the Phenergan DM and replace it with the Hycodan cough syrup to gain control of the cough and spasms. Advised increase fluid intake, observe and follow-up with PCP or return to urgent care if symptoms fail to improve within the next 7 to 10 days    ED Prescriptions     Medication Sig Dispense Auth. Provider   HYDROcodone bit-homatropine (HYCODAN) 5-1.5 MG/5ML syrup Take 5 mLs by mouth every 6 (six) hours as needed for cough. 120 mL Nyoka Lint, PA-C      I have reviewed the PDMP during this encounter.   Nyoka Lint, PA-C 09/24/21 1424

## 2021-09-24 NOTE — Progress Notes (Signed)
Virtual Visit Consent   Martin Spencer, you are scheduled for a virtual visit with a [facility] provider today. Just as with appointments in the office, your consent must be obtained to participate. Your consent will be active for this visit and any virtual visit you may have with one of our providers in the next 365 days. If you have a MyChart account, a copy of this consent can be sent to you electronically.  As this is a virtual visit, video technology does not allow for your provider to perform a traditional examination. This may limit your provider's ability to fully assess your condition. If your provider identifies any concerns that need to be evaluated in person or the need to arrange testing (such as labs, EKG, etc.), we will make arrangements to do so. Although advances in technology are sophisticated, we cannot ensure that it will always work on either your end or our end. If the connection with a video visit is poor, the visit may have to be switched to a telephone visit. With either a video or telephone visit, we are not always able to ensure that we have a secure connection.  By engaging in this virtual visit, you consent to the provision of healthcare and authorize for your insurance to be billed (if applicable) for the services provided during this visit. Depending on your insurance coverage, you may receive a charge related to this service.  I need to obtain your verbal consent now. Are you willing to proceed with your visit today? [name] has provided verbal consent on 09/24/2021 for a virtual visit (video or telephone). Martin Spencer, [place]  Date: 09/24/2021 12:05 PM  Virtual Visit via Video Note   I, Martin Spencer, connected with  [name]  ([id number], 03-12-1991) on 09/24/21 at 12:00 PM EDT by a video-enabled telemedicine application and verified that I am speaking with the correct person using two identifiers.  Location: Patient: Virtual Visit  Location Patient: Home Provider: Virtual Visit Location Provider: Home Office   I discussed the limitations of evaluation and management by telemedicine and the availability of in person appointments. The patient expressed understanding and agreed to proceed.    History of Present Illness: Martin Spencer is a 31 y.o. who identifies as a male who was assigned male at birth, and is being seen today for URI symptoms starting 7-8 days ago with significant congestion and productive cough. Was evaluated at local urgent care yesterday and diagnosed with bronchitis. Was started on Azithromycin and promethazine-dm. Notes taking as directed but feels the cough medication is not helping. 76 some old Tessalon he had but without any improvement. Was told by UC he may need something stronger for cough if these were not working so he is requesting this today.  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   Chest pain 11/17/2019   Acute pericarditis 11/17/2019   Mediastinal lymphadenopathy 11/17/2019   Acute appendicitis 11/14/2017    Allergies:  Allergies  Allergen Reactions   Other Other (See Comments)    Cats-sneezing,red itchy eyes   Medications:  Current Outpatient Medications:    brompheniramine-pseudoephedrine-DM 30-2-10 MG/5ML syrup, Take 5 mLs by mouth 4 (four) times daily as needed., Disp: 120 mL, Rfl: 0   acetaminophen (TYLENOL) 500 MG tablet, Take 500 mg by mouth every 6 (six) hours as needed for mild pain or  headache., Disp: , Rfl:    azithromycin (ZITHROMAX) 250 MG tablet, Take 1 tablet (250 mg total) by mouth daily. Take first 2 tablets together, then 1 every day until finished., Disp: 6 tablet, Rfl: 0  Observations/Objective: Patient is well-developed, well-nourished in no acute distress.  Resting comfortably at home.  Head is normocephalic, atraumatic.  No labored  breathing. Speech is clear and coherent with logical content.  Patient is alert and oriented at baseline.   Assessment and Plan: 1. Acute bacterial bronchitis - brompheniramine-pseudoephedrine-DM 30-2-10 MG/5ML syrup; Take 5 mLs by mouth 4 (four) times daily as needed.  Dispense: 120 mL; Refill: 0  Continue care as directed by UC provider yesterday including Azithromycin. Supportive measures and OTC medications reviewed. Will stop promethazine-DM as not helping and will try course of Bromfed. Discussed with him we do not provide cough medication containing controlled medications through this avenue. Script sent to pharmacy.   Follow Up Instructions: I discussed the assessment and treatment plan with the patient. The patient was provided an opportunity to ask questions and all were answered. The patient agreed with the plan and demonstrated an understanding of the instructions.  A copy of instructions were sent to the patient via MyChart unless otherwise noted below.   The patient was advised to call back or seek an in-person evaluation if the symptoms worsen or if the condition fails to improve as anticipated.  Time:  I spent 10 minutes with the patient via telehealth technology discussing the above problems/concerns.    Martin Rio, PA-C

## 2021-11-17 ENCOUNTER — Other Ambulatory Visit: Payer: Self-pay

## 2021-11-21 ENCOUNTER — Other Ambulatory Visit: Payer: Self-pay

## 2022-03-17 ENCOUNTER — Inpatient Hospital Stay
Admit: 2022-03-17 | Discharge: 2022-03-18 | Disposition: A | Discharge status: Home / Self Care | Payer: BLUE CROSS/BLUE SHIELD

## 2022-03-17 ENCOUNTER — Emergency Department: Admit: 2022-03-17 | Payer: BLUE CROSS/BLUE SHIELD | Primary: Diagnostic Radiology

## 2022-03-17 ENCOUNTER — Ambulatory Visit
Admit: 2022-03-17 | Discharge: 2022-03-17 | Payer: BLUE CROSS/BLUE SHIELD | Attending: Family Medicine | Primary: Diagnostic Radiology

## 2022-03-17 ENCOUNTER — Ambulatory Visit: Admit: 2022-03-17 | Discharge: 2022-03-17 | Payer: BLUE CROSS/BLUE SHIELD | Primary: Diagnostic Radiology

## 2022-03-17 DIAGNOSIS — R079 Chest pain, unspecified: Secondary | ICD-10-CM

## 2022-03-17 DIAGNOSIS — R0789 Other chest pain: Secondary | ICD-10-CM

## 2022-03-17 LAB — D-DIMER, QUANTITATIVE: D-Dimer, Quant: 0.63 CD:295178345 — ABNORMAL HIGH (ref 0.19–0.50)

## 2022-03-17 LAB — BASIC METABOLIC PANEL W/ REFLEX TO MG FOR LOW K
Anion Gap: 11 mmol/L (ref 2–17)
BUN: 10 mg/dL (ref 6–20)
CO2: 25 mmol/L (ref 22–29)
Calcium: 9.9 mg/dL (ref 8.6–10.0)
Chloride: 103 mmol/L (ref 98–107)
Creatinine: 0.7 mg/dL (ref 0.7–1.3)
Est, Glom Filt Rate: 128 mL/min/1.73m (ref >=60)
Glucose: 145 mg/dL — ABNORMAL HIGH (ref 70–99)
OSMOLALITY CALCULATED: 279 mOsm/kg (ref 270–287)
Potassium: 3.7 mmol/L (ref 3.5–5.3)
Sodium: 139 mmol/L (ref 135–145)

## 2022-03-17 LAB — CBC WITH AUTO DIFFERENTIAL
Absolute Baso #: 0 10*3/uL (ref 0.0–0.2)
Absolute Eos #: 0.1 10*3/uL (ref 0.0–0.5)
Absolute Lymph #: 1.3 10*3/uL (ref 1.0–3.2)
Absolute Mono #: 0.6 10*3/uL (ref 0.3–1.0)
Basophils %: 0.3 % (ref 0.0–2.0)
Eosinophils %: 1.4 % (ref 0.0–7.0)
Hematocrit: 43.5 % (ref 38.0–52.0)
Hemoglobin: 15.4 g/dL (ref 13.0–17.3)
Immature Grans (Abs): 0.01 10*3/uL (ref 0.00–0.06)
Immature Granulocytes: 0.1 % (ref 0.0–0.6)
Lymphocytes: 15.3 % (ref 15.0–45.0)
MCH: 30.7 pg (ref 27.0–34.5)
MCHC: 35.4 g/dL (ref 32.0–36.0)
MCV: 86.7 fL (ref 84.0–100.0)
MPV: 9.9 fL (ref 7.2–13.2)
Monocytes: 6.7 % (ref 4.0–12.0)
Neutrophils %: 76.2 % — ABNORMAL HIGH (ref 42.0–74.0)
Neutrophils Absolute: 6.6 10*3/uL (ref 1.6–7.3)
Platelets: 187 10*3/uL (ref 140–440)
RBC: 5.02 x10e6/mcL (ref 4.00–5.60)
RDW: 12.5 % (ref 11.0–16.0)
WBC: 8.7 10*3/uL (ref 3.8–10.6)

## 2022-03-17 LAB — TROPONIN: Troponin, High Sensitivity: <6 ng/L (ref 0–22)

## 2022-03-17 MED ORDER — KETOROLAC TROMETHAMINE 15 MG/ML IJ SOLN
15 MG/ML | Freq: Once | INTRAMUSCULAR | Status: AC
Start: 2022-03-17 — End: 2022-03-17
  Administered 2022-03-17: 23:00:00 15 mg via INTRAVENOUS

## 2022-03-17 MED ORDER — DEXAMETHASONE SODIUM PHOSPHATE 4 MG/ML IJ SOLN
4 MG/ML | Freq: Once | INTRAMUSCULAR | Status: DC
Start: 2022-03-17 — End: 2022-03-17

## 2022-03-17 MED ORDER — ACETAMINOPHEN 500 MG PO TABS
500 MG | Freq: Once | ORAL | Status: DC
Start: 2022-03-17 — End: 2022-03-17

## 2022-03-17 MED ORDER — SODIUM CHLORIDE 0.9 % IV BOLUS
0.9 % | Freq: Once | INTRAVENOUS | Status: AC
Start: 2022-03-17 — End: 2022-03-17
  Administered 2022-03-17: 22:00:00 1000 mL via INTRAVENOUS

## 2022-03-17 MED ORDER — IOPAMIDOL 76 % IV SOLN
76 % | Freq: Once | INTRAVENOUS | Status: AC | PRN
Start: 2022-03-17 — End: 2022-03-17
  Administered 2022-03-17: 23:00:00 100 mL via INTRAVENOUS

## 2022-03-17 MED FILL — KETOROLAC TROMETHAMINE 15 MG/ML IJ SOLN: 15 MG/ML | INTRAMUSCULAR | Qty: 1

## 2022-03-17 MED FILL — DEXAMETHASONE SODIUM PHOSPHATE 4 MG/ML IJ SOLN: 4 MG/ML | INTRAMUSCULAR | Qty: 1

## 2022-03-17 MED FILL — TYLENOL EXTRA STRENGTH 500 MG PO TABS: 500 MG | ORAL | Qty: 2

## 2022-03-17 NOTE — ED Provider Notes (Signed)
[facility]  EMERGENCY DEPARTMENT ENCOUNTER      Pt Name: James Mccann  MRN: [id number]  [place] [birth year]  Date of evaluation: 03/17/2022  Provider: Salena Saner, PA-C    CHIEF COMPLAINT       Chief Complaint   Patient presents with    Chest Pain     Pt c/o sharp upper chest pain for 5 days. Hx of pericarditis 2 years ago. Went to [facility] express care and sent here for further eval.     Cough     cough for 2 weeks         HISTORY OF PRESENT ILLNESS    The history is provided by the patient.       31 y.o. male presents to the ER with gradual onset constant sharp midsternal chest pain x 5 days.  Patient states he had pericarditis 2 years ago and this pain feels exactly the same.  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  He is feeling slightly short of breath and states he has an intermittent dry cough.  He states he has tried Tylenol and ibuprofen with minimal relief.  He otherwise denies any complaints or symptoms.  Denies fever, chills, lightheadedness, syncope, headache, orthopnea, hemoptysis, nausea, vomiting, diaphoresis, palpitations, leg swelling, abdominal pain, back pain.    Nursing Notes were reviewed.    REVIEW OF SYSTEMS     Review of Systems   All other systems reviewed and are negative.      Except as noted above the remainder of the review of systems was reviewed and negative.       PAST MEDICAL HISTORY   No past medical history on file.    SURGICAL HISTORY     No past surgical history on file.    CURRENT MEDICATIONS       Discharge Medication List as of 03/17/2022  7:02 PM          ALLERGIES     Patient has no known allergies.    FAMILY HISTORY     No family history on file.     SOCIAL HISTORY       Social History     Socioeconomic History    Marital status: Single   Tobacco Use    Smoking status: Never    Smokeless tobacco: Current        SCREENINGS         Glasgow Coma Scale  Eye Opening: Spontaneous  Best Verbal Response: Oriented  Best Motor Response: Obeys commands  Glasgow Coma Scale Score: 15                     CIWA Assessment  BP: 122/81  Pulse: 79                 PHYSICAL EXAM       ED Triage Vitals [03/17/22 1447]   BP Temp Temp Source Pulse Respirations SpO2 Height Weight - Scale   133/75 97.9 F (36.6 C) Oral 90 20 100 % 1.803 m (5\' 11" ) 81.6 kg (180 lb)       Physical Exam  Vitals and nursing note reviewed.   Constitutional:       General: He is not in acute distress.     Appearance: Normal appearance. He is not ill-appearing.   HENT:  Head: Normocephalic and atraumatic.      Mouth/Throat:      Mouth: Mucous membranes are moist.   Eyes:      Pupils: Pupils are equal, round, and reactive to light.   Cardiovascular:      Rate and Rhythm: Normal rate and regular rhythm.      Pulses: Normal pulses.   Pulmonary:      Effort: Pulmonary effort is normal. No respiratory distress.      Breath sounds: Normal breath sounds.   Abdominal:      General: Abdomen is flat. Bowel sounds are normal.      Palpations: Abdomen is soft.      Tenderness: There is no abdominal tenderness. There is no right CVA tenderness or left CVA tenderness.   Musculoskeletal:         General: No deformity. Normal range of motion.      Cervical back: Normal range of motion and neck supple.   Skin:     General: Skin is warm and dry.      Capillary Refill: Capillary refill takes less than 2 seconds.   Neurological:      General: No focal deficit present.      Mental Status: He is alert and oriented to person, place, and time.   Psychiatric:         Mood and Affect: Mood normal.         Behavior: Behavior normal.         DIAGNOSTIC RESULTS     EKG: All EKG's are interpreted by the Emergency Department Physician who either signs or Co-signs this chart in the absence of a cardiologist.    RADIOLOGY:   Non-plain film images such as CT, Ultrasound and MRI are read by the  radiologist. Plain radiographic images are visualized and preliminarily interpreted by the emergency physician with the below findings:    Interpretation per the Radiologist below, if available at the time of this note:    CTA PULMONARY W CONTRAST   Final Result   1. Cluster of small nodular foci medial aspect left lower lobe may reflect small    area of inflammation/infection.      2. No acute pulmonary embolus.      3. Anterior mediastinal soft tissue, which is nonspecific and may reflect    residual thymus or posttreatment type change. Additional nonspecific 11 mm right    hilar node.   -Given history of lymphoma, recommend comparison with most recent chest CT to    confirm stability.      XR CHEST PORTABLE   Final Result   Stable chest without acute abnormality.          LABS:  Labs Reviewed   CBC WITH AUTO DIFFERENTIAL - Abnormal; Notable for the following components:       Result Value    Neutrophils % 76.2 (*)     All other components within normal limits   BASIC METABOLIC PANEL W/ REFLEX TO MG FOR LOW K - Abnormal; Notable for the following components:    Glucose 145 (*)     All other components within normal limits   D-DIMER, QUANTITATIVE - Abnormal; Notable for the following components:    D-Dimer, Quant 0.63 (*)     All other components within normal limits   TROPONIN       All other labs were within normal range or not returned as of this dictation.    PROCEDURE:  Procedures  EMERGENCY DEPARTMENT COURSE/REASSESSMENT and MDM:   MDM  Number of Diagnoses or Management Options  Adenopathy, hilar  Chest pain, unspecified type  Diagnosis management comments: Patient is an overall well-appearing 31 year old male who presents to the emergency department afebrile with normal vital signs.  He is presenting today for chief complaint of substernal constant chest pain which was gradual in onset and feels worse today after 5 days.  He has some mild shortness of breath and dry cough additionally.  No further symptoms  or complaints.   EKG is nonischemic.  Given fluids. Troponin negative.  Dimer slightly elevated, CTA ordered which was significant for lower lobe small nodular foci in the medial aspect which may reflect small area of inflammation infection.  There is no PE.  There is also an additional nonspecific 11 mm right hilar lymph node.  I gave the patient Toradol in the emergency department for his chest pain.  I educated him on the incidental findings of the nonspecific 11 mm. right hilar lymph node given his history of lymphoma, instructed to follow-up with his oncologist tomorrow morning for surveillance of this and further workup.  Will treat him with steroids and Z-Pak for the left lower lobe nodular foci to cover for inflammation and/or infection. Discussed possibility of pericarditis as well with supportive care. Strict return precautions for any new or worsening symptoms.  He is well-appearing upon discharge.       Amount and/or Complexity of Data Reviewed  Clinical lab tests: reviewed  Tests in the radiology section of CPT: reviewed  Review and summarize past medical records: yes        CONSULTS:  None    ED Course as of 03/17/22 2212   Tue Mar 17, 2022   1650 The ECG interpreted by me, in the absence of a cardiologist, shows:  normal sinus rhythm   Rate of 83  Axis is Right axis deviation  QTc is normal  Intervals and Durations are unremarkable.      Nonspecific ST-T wave changes appreciated.  No evidence of acute ischemia.  [SW]      ED Course User Index  [SW] Janett Billow, PA-C        FINAL IMPRESSION      1. Chest pain, unspecified type    2. Adenopathy, hilar          DISPOSITION/PLAN   DISPOSITION Decision To Discharge 03/17/2022 07:00:50 PM      PATIENT REFERRED TO:  Primary Care Provider  Call [phone]  In 1 week      Seegars, Shon Hale, MD  [address]  [place] [address]  [phone]    In 1 week        DISCHARGE MEDICATIONS:  Discharge Medication List as of 03/17/2022  7:02 PM         START taking these medications    Details   predniSONE (DELTASONE) 20 MG tablet Take 1 tablet by mouth 2 times daily for 5 days, Disp-10 tablet, R-0Print      azithromycin (ZITHROMAX) 250 MG tablet Take 1 tablet by mouth See Admin Instructions for 5 days 500mg  on day 1 followed by 250mg  on days 2 - 5, Disp-6 tablet, R-0Print           Controlled Substances Monitoring:          No data to display                (Please note that portions of this  note were completed with a voice recognition program.  Efforts were made to edit the dictations but occasionally words are mis-transcribed.)    Janett Billow, PA-C (electronically signed)  Emergency Physician Assistant           Janett Billow, PA-C  03/17/22 2214

## 2022-03-17 NOTE — Progress Notes (Signed)
Ardyth Gal    Source: Patient appears reliable    Add'l history from: Epic records    Time Patient seen by Provider:2:14 PM     Chief complaint:    Chief Complaint   Patient presents with    Chest Pain     Patient reports chest pain started last week - hx of pericarditis and cancer.        HISTORY:    James Mccann is a 31 y.o. male who presents for evaluation of   Chief Complaint   Patient presents with    Chest Pain     Patient reports chest pain started last week - hx of pericarditis and cancer.      Very pleasant 31 year old male accompanied by his mother presents with complaint of mid substernal chest discomfort which started approximately 1 to 2 weeks ago however has worsened over the course the past 2 to 3 days.  He denies shortness of breath, cough however he does note some sharp discomfort with deep inspiration.  Patient states that symptoms were similar to when he was diagnosed with pericarditis and subsequently "gray zone lymphoma" in 2021.  Patient did have chemotherapy and lymph node removal of multiple lymph nodes in the mediastinum at that point at [facility]        Patient's Social History: Reviewed and confirmed in epic as below:    Social History     Socioeconomic History    Marital status: Single     Spouse name: None    Number of children: None    Years of education: None    Highest education level: None   Tobacco Use    Smoking status: Never    Smokeless tobacco: Current       Patient's Allergies Reviewed and confirmed as listed below    No Known Allergies    Current Medications reviewed and confirmed:    No outpatient medications have been marked as taking for the 03/17/22 encounter (Office Visit) with Geraldo Pitter, MD.       Nursing Notes and Vital Signs reviewed    EXAM:    VITALS: BP 112/77   Pulse 82   Temp 98 F (36.7 C)   Resp 16   Wt 81.6 kg (180 lb)   SpO2 100%     GENERAL: alert, well appearing, well nourished, no distress very pleasant    HEAD:  Normocephalic, no masses, lesions, or other abnormalities.    EYE EXAM: normal conjunctiva    EARS: External ears normal, hearing grossly intact.    NOSE: no deformity.    OROPHARYNX: lips and tongue normal. No erythema    HEART: regular rate and rhythm    LUNGS: Normal respiratory rate and normal respiratory effort. Good air movement. Bilaterally clear    No results found.       EKG reviewed: Normal sinus rhythm.  No acute ST or T wave changes.  No apparent beats.  Mild J-point elevation    Reading Physician Reading Date Result Priority   Clovia Cuff, MD  [phone] 03/17/2022      Narrative & Impression  Chest PA and lateral: 03/17/22     INDICATION: R07.9,Chest pain, unspecified,ICD-10-CM.  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     FINDINGS: Tiny clustered calcific densities medial left apical region, which may  be related to previous surgery. The lungs, pleura, heart, and mediastinal   contours are within normal limits. No concerning osseous abnormality.     IMPRESSION:  Lungs clear. No acute abnormality.          DIAGNOSIS:    Diagnosis Orders   1. Chest pain, unspecified type  EKG 12 Lead    XR CHEST (2 VW)          PLAN:  1. Chest pain, unspecified type  -     EKG 12 Lead  -     XR CHEST (2 VW); Future    Recommended the patient proceed to the emergency department for further evaluation/workup given history and location as well as increasing severity of discomfort.  Patient will proceed to [facility] ED via [place].  Report called for continuity and coordination of care.        Carolina Sink, MD

## 2022-03-18 LAB — EKG 12-LEAD
P Axis: 71 degrees
P-R Interval: 122 ms
Q-T Interval: 374 ms
QRS Duration: 94 ms
QTc Calculation (Bazett): 413 ms
R Axis: 91 degrees
T Axis: 70 degrees
Ventricular Rate: 83 {beats}/min

## 2022-03-18 MED ORDER — PREDNISONE 20 MG PO TABS
20 MG | ORAL_TABLET | Freq: Two times a day (BID) | ORAL | 0 refills | Status: AC
Start: 2022-03-18 — End: 2022-03-22

## 2022-03-18 MED ORDER — AZITHROMYCIN 250 MG PO TABS
250 MG | ORAL_TABLET | ORAL | 0 refills | Status: AC
Start: 2022-03-18 — End: 2022-03-22

## 2022-03-25 NOTE — Telephone Encounter (Signed)
MyChart activation project/first attempt/no answer

## 2023-02-09 ENCOUNTER — Encounter: Payer: Self-pay | Admitting: Hematology and Oncology

## 2023-02-09 NOTE — Telephone Encounter (Signed)
TC

## 2023-02-10 ENCOUNTER — Other Ambulatory Visit: Payer: Self-pay
